# Patient Record
Sex: Female | Born: 1940 | Race: White | Hispanic: No | Marital: Married | Smoking: Never smoker
Health system: Southern US, Community
[De-identification: ages and names within clinical notes are randomized; demographics above are authoritative.]

## PROBLEM LIST (undated history)

## (undated) DIAGNOSIS — M5416 Radiculopathy, lumbar region: Secondary | ICD-10-CM

## (undated) DIAGNOSIS — J189 Pneumonia, unspecified organism: Secondary | ICD-10-CM

## (undated) DIAGNOSIS — I214 Non-ST elevation (NSTEMI) myocardial infarction: Secondary | ICD-10-CM

## (undated) DIAGNOSIS — T4145XA Adverse effect of unspecified anesthetic, initial encounter: Secondary | ICD-10-CM

## (undated) DIAGNOSIS — I10 Essential (primary) hypertension: Secondary | ICD-10-CM

## (undated) DIAGNOSIS — R112 Nausea with vomiting, unspecified: Secondary | ICD-10-CM

## (undated) DIAGNOSIS — S322XXA Fracture of coccyx, initial encounter for closed fracture: Secondary | ICD-10-CM

## (undated) DIAGNOSIS — D649 Anemia, unspecified: Secondary | ICD-10-CM

## (undated) DIAGNOSIS — F419 Anxiety disorder, unspecified: Secondary | ICD-10-CM

## (undated) DIAGNOSIS — Z9889 Other specified postprocedural states: Secondary | ICD-10-CM

## (undated) DIAGNOSIS — T8859XA Other complications of anesthesia, initial encounter: Secondary | ICD-10-CM

## (undated) DIAGNOSIS — S3210XA Unspecified fracture of sacrum, initial encounter for closed fracture: Secondary | ICD-10-CM

## (undated) DIAGNOSIS — L209 Atopic dermatitis, unspecified: Secondary | ICD-10-CM

## (undated) DIAGNOSIS — F32A Depression, unspecified: Secondary | ICD-10-CM

## (undated) DIAGNOSIS — R0600 Dyspnea, unspecified: Secondary | ICD-10-CM

## (undated) DIAGNOSIS — F329 Major depressive disorder, single episode, unspecified: Secondary | ICD-10-CM

## (undated) DIAGNOSIS — E785 Hyperlipidemia, unspecified: Secondary | ICD-10-CM

## (undated) DIAGNOSIS — E039 Hypothyroidism, unspecified: Secondary | ICD-10-CM

## (undated) DIAGNOSIS — I219 Acute myocardial infarction, unspecified: Secondary | ICD-10-CM

## (undated) DIAGNOSIS — I2510 Atherosclerotic heart disease of native coronary artery without angina pectoris: Secondary | ICD-10-CM

## (undated) HISTORY — DX: Non-ST elevation (NSTEMI) myocardial infarction: I21.4

## (undated) HISTORY — DX: Atherosclerotic heart disease of native coronary artery without angina pectoris: I25.10

## (undated) HISTORY — DX: Atopic dermatitis, unspecified: L20.9

## (undated) HISTORY — PX: LAPAROSCOPIC HYSTERECTOMY: SHX1926

## (undated) HISTORY — DX: Acute myocardial infarction, unspecified: I21.9

## (undated) HISTORY — DX: Radiculopathy, lumbar region: M54.16

## (undated) HISTORY — DX: Hypothyroidism, unspecified: E03.9

## (undated) HISTORY — PX: APPENDECTOMY: SHX54

## (undated) HISTORY — DX: Unspecified fracture of sacrum, initial encounter for closed fracture: S32.10XA

## (undated) HISTORY — PX: HIP SURGERY: SHX245

## (undated) HISTORY — PX: OTHER SURGICAL HISTORY: SHX169

## (undated) HISTORY — PX: TOOTH EXTRACTION: SUR596

## (undated) HISTORY — PX: TONSILLECTOMY: SUR1361

## (undated) HISTORY — DX: Essential (primary) hypertension: I10

## (undated) HISTORY — DX: Hyperlipidemia, unspecified: E78.5

## (undated) HISTORY — DX: Fracture of coccyx, initial encounter for closed fracture: S32.2XXA

## (undated) HISTORY — PX: CARPAL TUNNEL RELEASE: SHX101

---

## 2001-03-09 ENCOUNTER — Encounter: Admission: RE | Admit: 2001-03-09 | Discharge: 2001-03-09 | Payer: Self-pay | Admitting: Internal Medicine

## 2001-03-09 ENCOUNTER — Encounter: Payer: Self-pay | Admitting: Internal Medicine

## 2001-04-13 ENCOUNTER — Other Ambulatory Visit: Admission: RE | Admit: 2001-04-13 | Discharge: 2001-04-13 | Payer: Self-pay | Admitting: Obstetrics and Gynecology

## 2001-07-19 ENCOUNTER — Ambulatory Visit (HOSPITAL_COMMUNITY): Admission: RE | Admit: 2001-07-19 | Discharge: 2001-07-19 | Payer: Self-pay | Admitting: Gastroenterology

## 2002-05-28 ENCOUNTER — Other Ambulatory Visit: Admission: RE | Admit: 2002-05-28 | Discharge: 2002-05-28 | Payer: Self-pay | Admitting: Obstetrics and Gynecology

## 2002-11-22 ENCOUNTER — Encounter: Admission: RE | Admit: 2002-11-22 | Discharge: 2002-11-22 | Payer: Self-pay | Admitting: Internal Medicine

## 2002-11-22 ENCOUNTER — Encounter: Payer: Self-pay | Admitting: Internal Medicine

## 2002-12-13 ENCOUNTER — Encounter: Payer: Self-pay | Admitting: Internal Medicine

## 2002-12-13 ENCOUNTER — Encounter: Admission: RE | Admit: 2002-12-13 | Discharge: 2002-12-13 | Payer: Self-pay | Admitting: Internal Medicine

## 2003-02-14 ENCOUNTER — Encounter: Admission: RE | Admit: 2003-02-14 | Discharge: 2003-02-14 | Payer: Self-pay | Admitting: Family Medicine

## 2003-02-14 ENCOUNTER — Encounter: Payer: Self-pay | Admitting: Family Medicine

## 2003-07-04 ENCOUNTER — Encounter: Payer: Self-pay | Admitting: General Surgery

## 2003-07-04 ENCOUNTER — Encounter: Admission: RE | Admit: 2003-07-04 | Discharge: 2003-07-04 | Payer: Self-pay | Admitting: General Surgery

## 2004-01-22 ENCOUNTER — Ambulatory Visit (HOSPITAL_BASED_OUTPATIENT_CLINIC_OR_DEPARTMENT_OTHER): Admission: RE | Admit: 2004-01-22 | Discharge: 2004-01-22 | Payer: Self-pay | Admitting: Orthopedic Surgery

## 2004-01-22 ENCOUNTER — Ambulatory Visit (HOSPITAL_COMMUNITY): Admission: RE | Admit: 2004-01-22 | Discharge: 2004-01-22 | Payer: Self-pay | Admitting: Orthopedic Surgery

## 2004-02-12 ENCOUNTER — Ambulatory Visit (HOSPITAL_BASED_OUTPATIENT_CLINIC_OR_DEPARTMENT_OTHER): Admission: RE | Admit: 2004-02-12 | Discharge: 2004-02-12 | Payer: Self-pay | Admitting: Orthopedic Surgery

## 2005-01-31 ENCOUNTER — Encounter: Admission: RE | Admit: 2005-01-31 | Discharge: 2005-01-31 | Payer: Self-pay | Admitting: Orthopedic Surgery

## 2005-02-03 ENCOUNTER — Ambulatory Visit (HOSPITAL_COMMUNITY): Admission: RE | Admit: 2005-02-03 | Discharge: 2005-02-03 | Payer: Self-pay | Admitting: Orthopedic Surgery

## 2005-02-03 ENCOUNTER — Ambulatory Visit (HOSPITAL_BASED_OUTPATIENT_CLINIC_OR_DEPARTMENT_OTHER): Admission: RE | Admit: 2005-02-03 | Discharge: 2005-02-03 | Payer: Self-pay | Admitting: Orthopedic Surgery

## 2005-05-25 ENCOUNTER — Encounter: Admission: RE | Admit: 2005-05-25 | Discharge: 2005-05-25 | Payer: Self-pay | Admitting: Family Medicine

## 2005-06-02 ENCOUNTER — Emergency Department (HOSPITAL_COMMUNITY): Admission: EM | Admit: 2005-06-02 | Discharge: 2005-06-03 | Payer: Self-pay | Admitting: Emergency Medicine

## 2005-07-11 ENCOUNTER — Encounter: Admission: RE | Admit: 2005-07-11 | Discharge: 2005-07-11 | Payer: Self-pay | Admitting: Family Medicine

## 2005-10-03 LAB — HM COLONOSCOPY

## 2006-02-09 LAB — CONVERTED CEMR LAB: Pap Smear: NORMAL

## 2006-06-08 ENCOUNTER — Emergency Department (HOSPITAL_COMMUNITY): Admission: EM | Admit: 2006-06-08 | Discharge: 2006-06-08 | Payer: Self-pay | Admitting: Emergency Medicine

## 2006-07-17 ENCOUNTER — Ambulatory Visit (HOSPITAL_COMMUNITY): Admission: RE | Admit: 2006-07-17 | Discharge: 2006-07-17 | Payer: Self-pay | Admitting: Family Medicine

## 2006-09-13 ENCOUNTER — Other Ambulatory Visit: Admission: RE | Admit: 2006-09-13 | Discharge: 2006-09-13 | Payer: Self-pay | Admitting: Family Medicine

## 2007-10-04 DIAGNOSIS — I214 Non-ST elevation (NSTEMI) myocardial infarction: Secondary | ICD-10-CM

## 2007-10-04 DIAGNOSIS — I219 Acute myocardial infarction, unspecified: Secondary | ICD-10-CM

## 2007-10-04 HISTORY — PX: CARDIAC CATHETERIZATION: SHX172

## 2007-10-04 HISTORY — DX: Acute myocardial infarction, unspecified: I21.9

## 2007-10-04 HISTORY — DX: Non-ST elevation (NSTEMI) myocardial infarction: I21.4

## 2007-11-11 ENCOUNTER — Ambulatory Visit: Payer: Self-pay | Admitting: Cardiology

## 2007-11-11 ENCOUNTER — Inpatient Hospital Stay (HOSPITAL_COMMUNITY): Admission: EM | Admit: 2007-11-11 | Discharge: 2007-11-13 | Payer: Self-pay | Admitting: Emergency Medicine

## 2007-11-29 ENCOUNTER — Ambulatory Visit: Payer: Self-pay | Admitting: Cardiovascular Disease

## 2008-01-04 ENCOUNTER — Ambulatory Visit: Payer: Self-pay | Admitting: Cardiovascular Disease

## 2008-01-04 LAB — CONVERTED CEMR LAB
Bilirubin, Direct: 0.1 mg/dL (ref 0.0–0.3)
Cholesterol: 153 mg/dL (ref 0–200)
HDL: 42.6 mg/dL (ref >39.0)
LDL Cholesterol: 93 mg/dL (ref 0–99)
Total Bilirubin: 0.8 mg/dL (ref 0.3–1.2)
VLDL: 18 mg/dL (ref 0–40)

## 2008-01-07 ENCOUNTER — Ambulatory Visit: Payer: Self-pay | Admitting: Cardiovascular Disease

## 2008-04-07 ENCOUNTER — Ambulatory Visit: Payer: Self-pay | Admitting: Cardiovascular Disease

## 2008-04-07 LAB — CONVERTED CEMR LAB
Albumin: 3.9 g/dL (ref 3.5–5.2)
Total Bilirubin: 0.9 mg/dL (ref 0.3–1.2)
Total Protein: 7.4 g/dL (ref 6.0–8.3)

## 2008-07-29 ENCOUNTER — Ambulatory Visit: Payer: Self-pay | Admitting: Cardiovascular Disease

## 2008-08-01 ENCOUNTER — Ambulatory Visit: Payer: Self-pay

## 2008-12-04 ENCOUNTER — Ambulatory Visit: Payer: Self-pay | Admitting: Internal Medicine

## 2008-12-04 DIAGNOSIS — E039 Hypothyroidism, unspecified: Secondary | ICD-10-CM | POA: Insufficient documentation

## 2008-12-04 DIAGNOSIS — M818 Other osteoporosis without current pathological fracture: Secondary | ICD-10-CM | POA: Insufficient documentation

## 2008-12-04 DIAGNOSIS — I252 Old myocardial infarction: Secondary | ICD-10-CM

## 2008-12-04 DIAGNOSIS — E785 Hyperlipidemia, unspecified: Secondary | ICD-10-CM

## 2008-12-04 DIAGNOSIS — M810 Age-related osteoporosis without current pathological fracture: Secondary | ICD-10-CM

## 2008-12-04 DIAGNOSIS — L2089 Other atopic dermatitis: Secondary | ICD-10-CM

## 2009-01-19 DIAGNOSIS — I2510 Atherosclerotic heart disease of native coronary artery without angina pectoris: Secondary | ICD-10-CM

## 2009-01-20 ENCOUNTER — Encounter: Payer: Self-pay | Admitting: Cardiovascular Disease

## 2009-01-20 ENCOUNTER — Ambulatory Visit: Payer: Self-pay | Admitting: Cardiovascular Disease

## 2009-01-23 ENCOUNTER — Ambulatory Visit: Payer: Self-pay | Admitting: Cardiovascular Disease

## 2009-01-26 LAB — CONVERTED CEMR LAB
ALT: 21 units/L (ref 0–35)
AST: 19 units/L (ref 0–37)
Albumin: 3.9 g/dL (ref 3.5–5.2)
Alkaline Phosphatase: 55 units/L (ref 39–117)
BUN: 14 mg/dL (ref 6–23)
Basophils Absolute: 0.2 10*3/uL — ABNORMAL HIGH (ref 0.0–0.1)
Basophils Relative: 3.8 % — ABNORMAL HIGH (ref 0.0–3.0)
Bilirubin, Direct: 0.1 mg/dL (ref 0.0–0.3)
CO2: 27 meq/L (ref 19–32)
Calcium: 9.5 mg/dL (ref 8.4–10.5)
Chloride: 109 meq/L (ref 96–112)
Cholesterol: 139 mg/dL (ref 0–200)
Creatinine, Ser: 0.7 mg/dL (ref 0.4–1.2)
Eosinophils Absolute: 0.5 10*3/uL (ref 0.0–0.7)
Eosinophils Relative: 9.8 % — ABNORMAL HIGH (ref 0.0–5.0)
GFR calc non Af Amer: 88.5 mL/min (ref >60)
Glucose, Bld: 89 mg/dL (ref 70–99)
HCT: 39.5 % (ref 36.0–46.0)
HDL: 44.7 mg/dL (ref >39.00)
Hemoglobin: 13.6 g/dL (ref 12.0–15.0)
LDL Cholesterol: 84 mg/dL (ref 0–99)
Lymphocytes Relative: 43.6 % (ref 12.0–46.0)
Lymphs Abs: 2.3 10*3/uL (ref 0.7–4.0)
MCHC: 34.5 g/dL (ref 30.0–36.0)
MCV: 94.8 fL (ref 78.0–100.0)
Monocytes Absolute: 0.2 10*3/uL (ref 0.1–1.0)
Monocytes Relative: 4.6 % (ref 3.0–12.0)
Neutro Abs: 2 10*3/uL (ref 1.4–7.7)
Neutrophils Relative %: 38.2 % — ABNORMAL LOW (ref 43.0–77.0)
Platelets: 206 10*3/uL (ref 150.0–400.0)
Potassium: 4.1 meq/L (ref 3.5–5.1)
RBC: 4.17 M/uL (ref 3.87–5.11)
RDW: 11.8 % (ref 11.5–14.6)
Sodium: 142 meq/L (ref 135–145)
TSH: 2.13 microintl units/mL (ref 0.35–5.50)
Total Bilirubin: 1 mg/dL (ref 0.3–1.2)
Total CHOL/HDL Ratio: 3
Total Protein: 7.2 g/dL (ref 6.0–8.3)
Triglycerides: 53 mg/dL (ref 0.0–149.0)
VLDL: 10.6 mg/dL (ref 0.0–40.0)
WBC: 5.2 10*3/uL (ref 4.5–10.5)

## 2009-04-07 ENCOUNTER — Ambulatory Visit: Payer: Self-pay | Admitting: Internal Medicine

## 2009-04-07 LAB — CONVERTED CEMR LAB
ALT: 21 units/L (ref 0–35)
AST: 20 units/L (ref 0–37)
Albumin ELP: 57.5 % (ref 55.8–66.1)
Alpha-1-Globulin: 4.3 % (ref 2.9–4.9)
Alpha-2-Globulin: 9.3 % (ref 7.1–11.8)
Basophils Absolute: 0 10*3/uL (ref 0.0–0.1)
Basophils Relative: 0.6 % (ref 0.0–3.0)
Beta Globulin: 5.3 % (ref 4.7–7.2)
Bilirubin, Direct: 0.1 mg/dL (ref 0.0–0.3)
CO2: 30 meq/L (ref 19–32)
Calcium: 9.6 mg/dL (ref 8.4–10.5)
Cholesterol, target level: 200 mg/dL
Eosinophils Relative: 6.3 % — ABNORMAL HIGH (ref 0.0–5.0)
Gamma Globulin: 18.6 % (ref 11.1–18.8)
HCT: 40.1 % (ref 36.0–46.0)
HDL goal, serum: 40 mg/dL
LDL Goal: 100 mg/dL
Monocytes Relative: 6.6 % (ref 3.0–12.0)
Neutrophils Relative %: 49.7 % (ref 43.0–77.0)
PTH: 62.4 pg/mL (ref 14.0–72.0)
Platelets: 218 10*3/uL (ref 150.0–400.0)
RBC: 4.21 M/uL (ref 3.87–5.11)
RDW: 12.2 % (ref 11.5–14.6)
TSH: 0.68 microintl units/mL (ref 0.35–5.50)
Total Protein, Urine: NEGATIVE mg/dL
Urine Glucose: NEGATIVE mg/dL
Urobilinogen, UA: 0.2 (ref 0.0–1.0)
Vit D, 25-Hydroxy: 32 ng/mL (ref 30–89)
WBC: 4.9 10*3/uL (ref 4.5–10.5)
pH: 7.5 (ref 5.0–8.0)

## 2009-04-08 ENCOUNTER — Encounter: Payer: Self-pay | Admitting: Internal Medicine

## 2009-04-08 ENCOUNTER — Ambulatory Visit: Payer: Self-pay | Admitting: Family Medicine

## 2009-04-13 ENCOUNTER — Telehealth: Payer: Self-pay | Admitting: Internal Medicine

## 2009-04-14 ENCOUNTER — Telehealth: Payer: Self-pay | Admitting: Internal Medicine

## 2009-04-17 ENCOUNTER — Encounter: Payer: Self-pay | Admitting: Internal Medicine

## 2009-05-01 ENCOUNTER — Encounter: Payer: Self-pay | Admitting: Internal Medicine

## 2009-05-01 ENCOUNTER — Telehealth: Payer: Self-pay | Admitting: Internal Medicine

## 2009-08-04 ENCOUNTER — Ambulatory Visit: Payer: Self-pay | Admitting: Cardiovascular Disease

## 2009-10-08 ENCOUNTER — Encounter: Payer: Self-pay | Admitting: Cardiovascular Disease

## 2009-10-27 ENCOUNTER — Ambulatory Visit: Payer: Self-pay | Admitting: Cardiovascular Disease

## 2009-11-02 LAB — CONVERTED CEMR LAB
Albumin: 4.1 g/dL (ref 3.5–5.2)
HDL: 51.1 mg/dL (ref >39.00)
TSH: 1.18 microintl units/mL (ref 0.35–5.50)
Total Bilirubin: 0.7 mg/dL (ref 0.3–1.2)
Total Protein: 7.4 g/dL (ref 6.0–8.3)
Triglycerides: 97 mg/dL (ref 0.0–149.0)

## 2010-05-26 ENCOUNTER — Emergency Department (HOSPITAL_COMMUNITY): Admission: EM | Admit: 2010-05-26 | Discharge: 2010-05-26 | Payer: Self-pay | Admitting: Emergency Medicine

## 2010-05-28 ENCOUNTER — Telehealth: Payer: Self-pay | Admitting: Cardiovascular Disease

## 2010-06-01 ENCOUNTER — Ambulatory Visit: Payer: Self-pay | Admitting: Cardiovascular Disease

## 2010-06-01 ENCOUNTER — Telehealth: Payer: Self-pay | Admitting: Cardiovascular Disease

## 2010-06-17 ENCOUNTER — Telehealth: Payer: Self-pay | Admitting: Cardiovascular Disease

## 2010-06-17 DIAGNOSIS — H5310 Unspecified subjective visual disturbances: Secondary | ICD-10-CM | POA: Insufficient documentation

## 2010-06-29 ENCOUNTER — Ambulatory Visit: Payer: Self-pay

## 2010-06-29 ENCOUNTER — Encounter: Payer: Self-pay | Admitting: Cardiovascular Disease

## 2010-07-30 ENCOUNTER — Encounter: Payer: Self-pay | Admitting: Cardiovascular Disease

## 2010-07-30 DIAGNOSIS — J309 Allergic rhinitis, unspecified: Secondary | ICD-10-CM | POA: Insufficient documentation

## 2010-07-30 DIAGNOSIS — M810 Age-related osteoporosis without current pathological fracture: Secondary | ICD-10-CM | POA: Insufficient documentation

## 2010-08-02 DIAGNOSIS — E559 Vitamin D deficiency, unspecified: Secondary | ICD-10-CM | POA: Insufficient documentation

## 2010-11-04 NOTE — Progress Notes (Signed)
Summary: Visual Change  Phone Note Call from Patient   Caller: Patient [phone] Reason for Call: Talk to Nurse Summary of Call: pt started carda tx and not feeling any better-is there any non rx meds she can take for bp? -pls call until 3p if not can be reached on monday Initial call taken by: Glynda Jaeger,  June 17, 2010 10:53 AM  Follow-up for Phone Call        I spoke with the pt and she only took [place] XT for 4 days.  The pt stopped due to swelling of her face and eyes.  The pt's BP yesterday was 132/72.  The pt wanted to know what she could do to lower her BP naturally.  I told the pt the main thing is to manage stress and anxiety.  The pt plans on restarting yoga and meditation.  The pt also wanted to make Dr Excell Seltzer aware of an episode she had a few days ago.  The pt was playing bridge on the computer and she could only see 10 or the 13 cards.  This visual change lasted a few seconds.  The pt decided to take a NTG and lay down for 15 minutes.  When the pt got up she was "good to go".  The pt did have a similar episode about 4 years ago.  With that episode she was reading the paper and could not see some of the words.  The pt did have a CT at that time and she received conflicting results about this test.  The pt did see Dr Elmer Picker (opthamologist) this morning for routine eye exam and everything was okay.  The pt has not had a carotid in the past.  I will make Dr Excell Seltzer aware of this episode to see if he would like to do further testing.     Follow-up by: Julieta Gutting, RN, BSN,  June 17, 2010 11:11 AM  Additional Follow-up for Phone Call Additional follow up Details #1::        Agree would check carotid duplex Additional Follow-up by: Norva Karvonen, MD,  June 20, 2010 9:10 PM  New Problems: VISUAL CHANGES (ICD-368.10)   Additional Follow-up for Phone Call Additional follow up Details #2::    Pt aware and carotid scheduled on 06/29/10 at 3:00. Follow-up by: Julieta Gutting, RN, BSN,  June 21, 2010 10:56 AM  New Problems: VISUAL CHANGES (ICD-368.10)

## 2010-11-04 NOTE — Assessment & Plan Note (Signed)
Summary: pt seen in ER for CP   Visit Type:  Follow-up Primary Provider:  Dr Duanne Guess  CC:  Chest pains. Pt. seen in ER.  History of Present Illness: This is a 70 year old one with coronary artery disease who initially presented in 2009 with an acute coronary syndrome. She was found to have an occluded small obtuse marginal branch that was treated with balloon angioplasty because of its small size. She has had no recurrent events since her initial presentation. Her LV ejection fraction is preserved at 55-60%. She underwent a stress nuclear study in October 2009 because of recurrent chest pain and this demonstrated normal perfusion throughout.  She is walking 30-40 minutes for exercise several days per week and denies exertional symptoms. However, she continues to have episodic resting chest tightness. She had a prolonged episode last week and went to the ER for evaluation. She ruled out for an MI and was discharged home after her ER evaluation. She is unable to take sublingual NTG because of severe headache. She has undergone stress testing for similar episodes of chest pain but these studies have not shown ischemia.  Has a fasting bloodwork visit with Dr Duanne Guess in October.  Current Medications (verified): 1)  Crestor 10 Mg Tabs (Rosuvastatin Calcium) .... Take 1 Tab By Mouth At Bedtime 2)  Levothyroxine Sodium 125 Mcg Tabs (Levothyroxine Sodium) .... Take 1 Tablet By Mouth Once A Day 3)  Coq10 400 Mg Caps (Coenzyme Q10) .... Take 1 Capsule By Mouth Once A Day 4)  Cormax 0.05 % Soln (Clobetasol Propionate) .... Apply To Scalp Two Times A Day As Needed 5)  Desoximetasone 0.25 % Oint (Desoximetasone) .... Apply To Aa Two Times A Day As Needed 6)  Benadryl 25 Mg Caps (Diphenhydramine Hcl) .... Take 1 By Mouth At Bedtime 7)  Calcium Carbonate-Vitamin D 600-400 Mg-Unit  Tabs (Calcium Carbonate-Vitamin D) .... Take 1 Tablet By Mouth Two Times A Day 8)  Aspirin 81 Mg Tbec (Aspirin) .... Take One Tablet  By Mouth Daily 9)  Icaps Lutein-Zeaxanthin  Cr-Tabs (Specialty Vitamins Products) .... Take 1 Capsule By Mouth Once A Day 10)  Nitrostat 0.4 Mg Subl (Nitroglycerin) .Marland Kitchen.. 1 Tablet Under Tongue At Onset of Chest Pain; You May Repeat Every 5 Minutes For Up To 3 Doses.  Allergies: 1)  ! Penicillin 2)  ! Erythromycin 3)  ! Tetracycline 4)  ! Cipro 5)  ! * Shell Fish 6)  ! * Latex 7)  ! Keflex 8)  ! * Ppg 9)  ! * Proplyene Glycol  Past History:  Past medical history reviewed for relevance to current acute and chronic problems.  Past Medical History: Reviewed history from 01/19/2009 and no changes required. CAD, UNSPECIFIED SITE (ICD-414.00) with NSTEMI 2009. Small OM treated with PTCA ECZEMA, ATOPIC (ICD-691.8) FAMILY HISTORY OF COLON CA 1ST DEGREE RELATIVE <60 (ICD-V16.0) OSTEOPOROSIS (ICD-733.00) MYOCARDIAL INFARCTION, HX OF (ICD-412) HYPOTHYROIDISM (ICD-244.9) HYPERLIPIDEMIA (ICD-272.4)    Review of Systems       Negative except as per HPI   Vital Signs:  Patient profile:   70 year old female Height:      65 inches Weight:      147 pounds BMI:     24.55 Pulse rate:   68 / minute Pulse rhythm:   regular Resp:     18 per minute BP sitting:   141 / 86  (left arm) Cuff size:   large  Vitals Entered By: Vikki Ports (June 01, 2010 3:00 PM)  Physical  Exam  General:  Pt is alert and oriented, in no acute distress. HEENT: normal Neck: normal carotid upstrokes without bruits, JVP normal Lungs: CTA CV: RRR without murmur or gallop Abd: soft, NT, positive BS, no bruit, no organomegaly Ext: no clubbing, cyanosis, or edema. peripheral pulses 2+ and equal Skin: warm and dry without rash    EKG  Procedure date:  06/01/2010  Findings:      NSR with age-inderminate inferior infarct, unchanged from previous tracing, HR 69 bpm.  Impression & Recommendations:  Problem # 1:  CAD, UNSPECIFIED SITE (ICD-414.00) Pt with recurrent chest pain without objective evidence  of ischemia. However, her symptoms are fairly typical of angina. I suspect she is either having vasospasm or microvascular ischemia. Recommend trial of long-acting diltiazem. Will otherwise continue her current medical program as outlined below.  Her updated medication list for this problem includes:    Aspirin 81 Mg Tbec (Aspirin) .Marland Kitchen... Take one tablet by mouth daily    Nitrostat 0.4 Mg Subl (Nitroglycerin) .Marland Kitchen... 1 tablet under tongue at onset of chest pain; you may repeat every 5 minutes for up to 3 doses.    Cartia Xt 120 Mg Xr24h-cap (Diltiazem hcl coated beads) .Marland Kitchen... Take one capsule by mouth once daily  Orders: EKG w/ Interpretation (93000)  Problem # 2:  HYPERLIPIDEMIA (ICD-272.4) Fasting bloodwork with Dr Duanne Guess scheduled in October. Lipids have been at goal as below.  Her updated medication list for this problem includes:    Crestor 10 Mg Tabs (Rosuvastatin calcium) .Marland Kitchen... Take 1 tab by mouth at bedtime  CHOL: 148 (10/27/2009)   LDL: 78 (10/27/2009)   HDL: 51.10 (10/27/2009)   TG: 97.0 (10/27/2009) CHOL (goal): 200 (04/07/2009)   LDL (goal): 100 (04/07/2009)   HDL (goal): 40 (04/07/2009)   TG (goal): 150 (04/07/2009)  Patient Instructions: 1)  Your physician has recommended you make the following change in your medication: START Diltiazem CD 120mg  once a day 2)  Your physician wants you to follow-up in: 6 MONTHS.   You will receive a reminder letter in the mail two months in advance. If you don't receive a letter, please call our office to schedule the follow-up appointment. Prescriptions: DILT-CD 120 MG XR24H-CAP (DILTIAZEM HCL COATED BEADS) take one capsule by mouth once daily  #90 x 3   Entered by:   Julieta Gutting, RN, BSN   Authorized by:   Norva Karvonen, MD   Signed by:   Julieta Gutting, RN, BSN on 06/01/2010   Method used:   Electronically to        [facility] [phone]* (retail)       [address] [place], [place]  [address]        Ph: [phone]       Fax: [phone]   RxID:   [id number] NITROSTAT 0.4 MG SUBL (NITROGLYCERIN) 1 tablet under tongue at onset of chest pain; you may repeat every 5 minutes for up to 3 doses.  #25 x 2   Entered by:   Julieta Gutting, RN, BSN   Authorized by:   Norva Karvonen, MD   Signed by:   Julieta Gutting, RN, BSN on 06/01/2010   Method used:   Electronically to        [facility] [phone]* (retail)       [address]  [place], [place]  [address]       Ph: [phone]       Fax: [phone]   RxID:   [id number]

## 2010-11-04 NOTE — Progress Notes (Signed)
Summary: ER Visit  Phone Note Outgoing Call   Call placed by: Julieta Gutting, RN, BSN,  May 28, 2010 12:07 PM Call placed to: Patient Summary of Call: I received a notification from the [facility] ER that this pt was seen in the ER on 05/26/10 for CP.   I contacted the pt and she said she has been okay since leaving the hospital.  The pt said she was under the impression that our office was going to call her on 05/26/10 for an appt.  I asked her why she did not contact our office after not receiving a call and she said she was tired. Per ER instructions the pt was to call our office if she had not received a call by 2:00 on 05/26/10.   I arranged an appt on 06/01/10 for the pt to see Dr Excell Seltzer. I instructed the pt to call our office if she had any other questions or concerns.  Initial call taken by: Julieta Gutting, RN, BSN,  May 28, 2010 12:03 PM

## 2010-11-04 NOTE — Progress Notes (Signed)
Summary: [facility] question re med  Phone Note From Pharmacy   Reason for Call: Talk to Nurse Caller: [facility] [phone]* Summary of Call: [facility] has questions re rx we sent in today-pls call [phone] Initial call taken by: Glynda Jaeger,  June 01, 2010 4:16 PM  Follow-up for Phone Call        I spoke with the pt's pharmacist at [facility] and they do not carry Diltiazem CD.  The pharmacist has Cartia XT 120mg  in stock and this will be cheaper for the pt.  The pharmacist will change Rx over to [facility] XT 120mg  once daily.  Dr Excell Seltzer and pt aware. Follow-up by: Julieta Gutting, RN, BSN,  June 01, 2010 5:03 PM    New/Updated Medications: CARTIA XT 120 MG XR24H-CAP (DILTIAZEM HCL COATED BEADS) take one capsule by mouth once daily

## 2010-11-04 NOTE — Letter (Signed)
Summary: [name]   [facility]   Imported By: Roderic Ovens 11/20/2009 11:30:58  _____________________________________________________________________  External Attachment:    Type:   Image     Comment:   External Document

## 2010-12-03 ENCOUNTER — Encounter (INDEPENDENT_AMBULATORY_CARE_PROVIDER_SITE_OTHER): Payer: Medicare Other | Admitting: Cardiovascular Disease

## 2010-12-03 ENCOUNTER — Encounter: Payer: Self-pay | Admitting: Cardiovascular Disease

## 2010-12-03 ENCOUNTER — Telehealth: Payer: Self-pay | Admitting: Cardiovascular Disease

## 2010-12-03 DIAGNOSIS — E7800 Pure hypercholesterolemia, unspecified: Secondary | ICD-10-CM

## 2010-12-03 DIAGNOSIS — I2510 Atherosclerotic heart disease of native coronary artery without angina pectoris: Secondary | ICD-10-CM

## 2010-12-03 NOTE — Progress Notes (Unsigned)
This encounter was created in error - please disregard.

## 2010-12-03 NOTE — Progress Notes (Signed)
This encounter was created in error - please disregard.

## 2010-12-09 NOTE — Assessment & Plan Note (Signed)
Summary: Victoria [name]   Visit Type:  Follow-up Primary Provider:  Dr Duanne Guess  CC:  Stress.  History of Present Illness: This is a 70 year old one with coronary artery disease who initially presented in 2009 with an acute coronary syndrome. She was found to have an occluded small obtuse marginal branch that was treated with balloon angioplasty because of its small size. She has had no recurrent events since her initial presentation. Her LV ejection fraction is preserved at 55-60%. She underwent a stress nuclear study in October 2009 because of recurrent chest pain and this demonstrated normal perfusion throughout.  She complains of a 'mental fog.' She says 'I feel drugged' and thinks she doesn't feel like she's thinking clearly. She really feels debilitated from this - she hasn't been driving as much and is less engaged with her friends.  She has had URI symptoms over much of the Winter months. She denies chest pain or dyspnea. No edema. No other complaints.   Current Medications (verified): 1)  Crestor 10 Mg Tabs (Rosuvastatin Calcium) .... Take 1 Tab By Mouth At Bedtime 2)  Levothyroxine Sodium 125 Mcg Tabs (Levothyroxine Sodium) .... Take 1 Tablet By Mouth Once A Day 3)  Coq10 400 Mg Caps (Coenzyme Q10) .... Take 1 Capsule By Mouth Once A Day 4)  Cormax 0.05 % Soln (Clobetasol Propionate) .... Apply To Scalp Two Times A Day As Needed 5)  Desoximetasone 0.25 % Oint (Desoximetasone) .... Apply To Aa Two Times A Day As Needed 6)  Benadryl 25 Mg Caps (Diphenhydramine Hcl) .... Take 1 By Mouth At Bedtime 7)  Calcium Carbonate-Vitamin D 600-400 Mg-Unit  Tabs (Calcium Carbonate-Vitamin D) .... Take 1 Tablet By Mouth Two Times A Day 8)  Aspirin 81 Mg Tbec (Aspirin) .... Take One Tablet By Mouth Daily 9)  Icaps Lutein-Zeaxanthin  Cr-Tabs (Specialty Vitamins Products) .... Take 1 Capsule By Mouth Once A Day 10)  Nitrostat 0.4 Mg Subl (Nitroglycerin) .Marland Kitchen.. 1 Tablet Under Tongue At Onset of Chest Pain; You May  Repeat Every 5 Minutes For Up To 3 Doses. 11)  Vitamin D 1000 Unit Tabs (Cholecalciferol) .... Take 1 Tablet By Mouth Once A Day  Allergies: 1)  ! Penicillin 2)  ! Erythromycin 3)  ! Tetracycline 4)  ! Cipro 5)  ! * Shell Fish 6)  ! * Latex 7)  ! Keflex 8)  ! * Ppg 9)  ! * Proplyene Glycol  Past History:  Past medical history reviewed for relevance to current acute and chronic problems.  Past Medical History: Reviewed history from 01/19/2009 and no changes required. CAD, UNSPECIFIED SITE (ICD-414.00) with NSTEMI 2009. Small OM treated with PTCA ECZEMA, ATOPIC (ICD-691.8) FAMILY HISTORY OF COLON CA 1ST DEGREE RELATIVE <60 (ICD-V16.0) OSTEOPOROSIS (ICD-733.00) MYOCARDIAL INFARCTION, HX OF (ICD-412) HYPOTHYROIDISM (ICD-244.9) HYPERLIPIDEMIA (ICD-272.4)    Review of Systems       Positive for lots of stress (ill family members), otherwise negative except as per HPI   Vital Signs:  Patient profile:   70 year old female Height:      65 inches Weight:      146.50 pounds BMI:     24.47 Pulse rate:   74 / minute Pulse rhythm:   regular Resp:     18 per minute BP sitting:   140 / 80  (left arm) Cuff size:   large  Vitals Entered By: Julieta Gutting, RN, BSN (December 03, 2010 12:15 PM)  Physical Exam  General:  Pt is alert and oriented,  in no acute distress. HEENT: normal Neck: normal carotid upstrokes without bruits, JVP normal Lungs: CTA CV: RRR without murmur or gallop Abd: soft, NT, positive BS, no bruit, no organomegaly Ext: no clubbing, cyanosis, or edema. peripheral pulses 2+ and equal Skin: warm and dry without rash    EKG  Procedure date:  12/03/2010  Findings:      NSR, RSR' in V1, otherwise WNL  Impression & Recommendations:  Problem # 1:  CAD, UNSPECIFIED SITE (ICD-414.00) Pt stable without recurrent angina. Continue antiplatelet Rx with ASA. We had a long discussion about the benefit and side effects of her statin drug. It's clear that her memory  and cognitive problems are making a big impact on her life. I've recommended stopping rosuvastatin for at least a few months to see if these symptoms improve. She will call in and let us know.  Her updated medication list for this problem includes:    Aspirin 81 Mg Tbec (Aspirin) .Marland Kitchen... Take one tablet by mouth daily    Nitrostat 0.4 Mg Subl (Nitroglycerin) .Marland Kitchen... 1 tablet under tongue at onset of chest pain; you may repeat every 5 minutes for up to 3 doses.  Problem # 2:  HYPERLIPIDEMIA (ICD-272.4) Lipids have been well-controlled on statin therapy. See discussion above. Will bring her back in 3 months for a lipid panel.  The following medications were removed from the medication list:    Crestor 10 Mg Tabs (Rosuvastatin calcium) .Marland Kitchen... Take 1 tab by mouth at bedtime  CHOL: 148 (10/27/2009)   LDL: 78 (10/27/2009)   HDL: 51.10 (10/27/2009)   TG: 97.0 (10/27/2009) CHOL (goal): 200 (04/07/2009)   LDL (goal): 100 (04/07/2009)   HDL (goal): 40 (04/07/2009)   TG (goal): 150 (04/07/2009)  Patient Instructions: 1)  Your physician has recommended you make the following change in your medication: STOP Crestor 2)  Your physician wants you to follow-up in: 1 YEAR.   You will receive a reminder letter in the mail two months in advance. If you don't receive a letter, please call our office to schedule the follow-up appointment. 3)  Your physician recommends that you return for a FASTING LIPID and LIVER in 3 MONTHS--Nothing to eat or drink after midnight, lab opens at 8:30 (414.02, 272.0)--due for labs the week of 03/07/11

## 2010-12-09 NOTE — Progress Notes (Signed)
Summary: Fish Oil  Phone Note Call from Patient Call back at [facility] Phone [phone]   Caller: Patient Summary of Call: Question abot taking Fish Oil Initial call taken by: Judie Grieve,  December 03, 2010 2:46 PM  Follow-up for Phone Call        I spoke with the pt and she said Dr Excell Seltzer had spoken with her about starting Fish Oil.  The pt was wondering what dose of Fish oil she needs to take.  I made her aware this is OTC and she can take Fish Oil 1000mg  two times a day.   Follow-up by: Julieta Gutting, RN, BSN,  December 03, 2010 6:28 PM    New/Updated Medications: FISH OIL 1000 MG CAPS (OMEGA-3 FATTY ACIDS) take one by mouth two times a day

## 2010-12-17 LAB — DIFFERENTIAL
Eosinophils Relative: 7 % — ABNORMAL HIGH (ref 0–5)
Lymphocytes Relative: 52 % — ABNORMAL HIGH (ref 12–46)
Lymphs Abs: 3.7 10*3/uL (ref 0.7–4.0)
Monocytes Absolute: 0.5 10*3/uL (ref 0.1–1.0)
Monocytes Relative: 6 % (ref 3–12)
Neutro Abs: 2.4 10*3/uL (ref 1.7–7.7)
Neutrophils Relative %: 34 % — ABNORMAL LOW (ref 43–77)

## 2010-12-17 LAB — BASIC METABOLIC PANEL
BUN: 15 mg/dL (ref 6–23)
GFR calc Af Amer: >60 mL/min (ref >60)
Potassium: 3.7 mEq/L (ref 3.5–5.1)
Sodium: 137 mEq/L (ref 135–145)

## 2010-12-17 LAB — CBC
Hemoglobin: 12.8 g/dL (ref 12.0–15.0)
MCHC: 34.2 g/dL (ref 30.0–36.0)
Platelets: 215 10*3/uL (ref 150–400)
RDW: 12.9 % (ref 11.5–15.5)
WBC: 7.1 10*3/uL (ref 4.0–10.5)

## 2010-12-17 LAB — POCT CARDIAC MARKERS: Troponin i, poc: <0.05 ng/mL (ref 0.00–0.09)

## 2010-12-21 NOTE — Progress Notes (Signed)
This encounter was created in error - please disregard.

## 2011-01-21 ENCOUNTER — Telehealth: Payer: Self-pay | Admitting: Cardiovascular Disease

## 2011-01-21 NOTE — Telephone Encounter (Signed)
The pt is having issues with congestion due to allergies and cold symptoms.  This pt does have a history of CAD but does not take any prescribed cardiac medications.  I made the pt aware that she can take claritin, Zyrtec and Benadryl as needed for symptoms.  The pt can try decongestants for her symptoms but I made her aware that she may have an increased pulse and BP with these meds.  I also made the pt aware that she can try Coricidin HBP for symptoms.  The pt will call back with any other questions or concerns.

## 2011-02-15 NOTE — Assessment & Plan Note (Signed)
[facility]                            CARDIOLOGY OFFICE NOTE   NYKERIA, MEALING                  MRN:          [id number]  DATE:11/29/2007                            DOB:          1941-01-20    HISTORY:  Victoria [name] was seen in hospital followup at [facility]  Cardiology office on November 29, 2007.  She was admitted at [facility] on February 7 with a non-ST-elevation MI and was discharged  home on February 10.  She underwent emergent cardiac cath early in the  morning of February 8 with increasing cardiac enzymes and ongoing chest  discomfort.  Her EKG was nondiagnostic.  Cardiac catheterization  demonstrated severe stenosis in a small branch of the left circumflex  with moderate disease in the LAD and mild disease in the right coronary  artery.  She had a very focal akinetic segment in the lateral wall at  ventriculography with overall preserved LV function and an estimated EF  of 60-65%.  She underwent balloon angioplasty of a small obtuse marginal  branch and had good angiographic result after PTCA.  The vessel was too  small to stent.   Victoria [name] has been doing reasonably well since her discharge home  from the hospital.  She has had no recurrent chest pain and has not had  any symptoms that are similar to those at her presentation.  She has had  some sharp, fleeting pains in the mid back area.  These have been  nonexertional.  Her main complaint is generalized fatigue.  She feels  extremely fatigued since her discharge and relates this to medication  side effect.  She has not been able to exercise or do much of anything  because of tiredness.  She has had no orthopnea, PND, dyspnea or edema.   MEDICATIONS:  1. Include aspirin 81 mg daily.  2. Plavix 75 mg daily.  3. Levothyroxine 125 mcg daily.  4. Fosamax 70 mg weekly.  5. Crestor 5 mg daily.  6. Coenzyme Q 10 daily.  7. Fish oil daily.  8. Calcium plus D 2000  international units.  9. Multivitamin.  10.Metoprolol 25 mg twice daily.  11.Ocuvite.   ALLERGIES:  Include LATEX, PENICILLIN, TETRACYCLINE, CIPRO,  ERYTHROMYCIN, SHELLFISH.   PHYSICAL EXAMINATION:  GENERAL:  The patient is alert and oriented.  She  is in no acute distress.  VITAL SIGNS:  Weight is 138, blood pressure 116/70, heart rate 75,  respiratory rate 16.  HEENT:  Normal.  NECK:  Normal.  Carotid upstrokes without bruits.  Jugular venous  pressure normal.  LUNGS:  Clear to auscultation bilaterally.  HEART:  The apex is discrete, nondisplaced.  Heart is regular rate and  rhythm without murmurs or gallops.  ABDOMEN:  Soft, nontender.  No organomegaly.  No bruits.  EXTREMITIES:  No clubbing, cyanosis or edema.  Peripheral pulses 2+ and  equal throughout   EKG shows normal sinus rhythm with a right IVCD and left axis deviation.   Lab work reviewed from her hospitalization demonstrated a normal TSH.  Total cholesterol was 177, HDL 55, LDL 114.  Her troponin peaked at 21  and total CK peaked at 1187 with an MB peak of 126.   ASSESSMENT:  1. Coronary artery disease and recent non-ST-elevation myocardial      infarction.  Victoria [name] is having no angina.  Will continue dual      antiplatelet therapy with aspirin and Plavix.  She has had problems      with statins but is tolerating low dose Crestor.  I think she is      having side effects from metoprolol and she has extreme fatigue.  I      have asked her to hold her metoprolol and see if her symptoms      resolve.  Clearly she will gain long term benefit from a standpoint      of cardiac risk reduction but her symptoms are intolerable and I      think this medication should be held.  If this does not make a      significant impact on her fatigue then certainly she should be      restarted on metoprolol.  I would like to see her back in 6 weeks      to reassess and will see how her fatigue responds to holding her       beta-blocker.  2. Dyslipidemia, followup lipids and LFTs at the time of her return      visit.  3. As above.  Will follow up with Victoria [name] in approximately 6      weeks.     Veverly Fells. Excell Seltzer, MD  Electronically Signed    MDC/MedQ  DD: 11/29/2007  DT: 11/30/2007  Job #: [id number]   cc:   Georgianne Fick, M.D.  Bernette Redbird, M.D.

## 2011-02-15 NOTE — Assessment & Plan Note (Signed)
[facility]                            CARDIOLOGY OFFICE NOTE   EDMUND, RICK                  MRN:          [id number]  DATE:07/29/2008                            DOB:          [birth year]    REASON FOR VISIT:  Chest pain with underlying CAD.   HISTORY OF PRESENT ILLNESS:  Victoria [name] is a 70 year old woman who was  hospitalized in February of this year with a non-ST-elevation MI.  She  underwent angioplasty of a small obtuse marginal branch vessel, which  appeared to be her infarct vessel.  She had only minor nonobstructive  disease in the right coronary artery and had a moderate LAD stenosis of  50-60%, visualized at that time.  Following hospital discharge, she has  had periodic episodes of chest pain.  However, over the last month, she  reports increased chest pain at rest.  She really does not have symptoms  with exertion.  However, on a nearly daily basis, she has developed left-  sided chest pain radiating to the left neck and left arm, that feels  like a pressure.  She generally takes aspirin and her symptoms resolved  within 20 minutes.  Her symptoms have not been responsive to  nitroglycerin.  She complains of mild exertional dyspnea, as well as  generalized fatigue.  She denies orthopnea, PND, edema, palpitations,  lightheadedness, or syncope.   MEDICATIONS:  1. Aspirin 81 mg daily.  2. Plavix 75 mg daily.  3. Levothyroxine 125 mcg daily.  4. Coenzyme Q10, 100 mg daily.  5. Fish oil 1 daily.  6. Calcium plus D daily.  7. Multivitamin daily.  8. Benadryl at bedtime.  9. Crestor 10 mg daily.  10.P.r.n. medications include sublingual nitroglycerin.   ALLERGIES:  Drug allergies are multiple and include LATEX, PENICILLIN,  TETRACYCLINE, ERYTHROMYCIN, CIPRO and SHELLFISH.   PHYSICAL EXAMINATION:  GENERAL:  She is alert and oriented, in no acute  distress.  VITAL SIGNS:  Weight 145 pounds, blood pressure is 138/84, heart  rate  64, respiratory rate is 16.  HEENT:  Normal.  NECK:  Normal carotid upstrokes.  No bruits.  JVP normal.  LUNGS:  Clear bilaterally.  HEART:  Regular rate and rhythm.  No murmurs or gallops.  ABDOMEN:  Soft, nontender, no organomegaly.  EXTREMITIES:  No clubbing, cyanosis, or edema.  Peripheral pulses are  intact and equal.  SKIN:  Warm and dry without rash.   EKG shows sinus rhythm with left ward axis.  There are no significant ST-  segment or T-wave changes.   ASSESSMENT:  1. Coronary artery disease status post non-ST elevation myocardial      infarction.  I am concerned about Victoria [name]'s recurrent symptoms      of chest pain.  While they are nonexertional, they certainly have      typical characteristics of anginal pain.  We will perform an      exercise rest/stress Myoview scan to rule out significant ischemia.      Of note, she did have moderate left anterior descending (coronary      artery) stenosis at the  time of her catheterization and certainly,      I would like to rule out significant anterior wall ischemia.  Her      symptoms sound consistent with vasospastic angina.  After she      completes her stress test we will give her a trial of diltiazem CD      120 mg daily.  She has been intolerant to beta blockers secondary      to profound fatigue.  Since her angina is not nitrate responsive,      we will try a calcium channel blocker instead.  2. Dyslipidemia.  Lipids from July showed a cholesterol of 149,      triglycerides 46, HDL 49, and LDL 90.  We will continue Crestor.   For followup, I would like to see the patient back in 6 months.  We will  followup after her Myoview results are available.     Veverly Fells. Excell Seltzer, MD  Electronically Signed    MDC/MedQ  DD: 07/29/2008  DT: 07/30/2008  Job #: [id number]

## 2011-02-15 NOTE — Cardiovascular Report (Signed)
Victoria [name], BRAND NO.:  [id number]   MEDICAL RECORD NO.:  [id number]          PATIENT TYPE:  OBV   LOCATION:  2908                         FACILITY:  [facility]   PHYSICIAN:  Veverly Fells. Excell Seltzer, MD  DATE OF BIRTH:  1941/03/05   DATE OF PROCEDURE:  11/11/2007  DATE OF DISCHARGE:                            CARDIAC CATHETERIZATION   PROCEDURE:  Left heart catheterization, selective coronary angiography,  left ventricular angiography, PTCA of OM-2 and Angio-Seal of the right  femoral artery.   INDICATIONS:  Victoria [name] is a 70 year old woman who presented earlier  today with an acute coronary syndrome.  She had severe substernal chest  pain that resolved in a relatively short period of time.  Her initial  biomarkers were negative and her EKG was nondiagnostic with no  significant ST-segment elevation.  Her second set of biomarkers returned  markedly positive with a troponin greater than 20.  She developed a dull  substernal chest pain and we activated the cath lab emergently in the  setting of her non ST elevation MI with ongoing ischemia.   Risks and indications of the procedure were reviewed with the patient.  Informed consent was obtained.  The right groin was prepped, draped,  anesthetized with 1% lidocaine.  Using the modified Seldinger technique  a 6-French sheath was placed in the right femoral artery.  Standard 6-  French Judkins catheters were used for selective coronary angiography.  An angled pigtail catheter was used for left ventriculography.  Left  ventriculography was performed in both the RAO and LAO projection.   At the conclusion of the diagnostic procedure, I elected to intervene on  the second OM branch of the left circumflex.  There was a 95% stenosis  and I suspected this was the culprit vessel.  The patient had a focal  akinetic wall motion abnormality involving the lateral wall.  There was  no other critical disease seen.  The vessel was very  small, but I  thought it was amenable to at least balloon angioplasty with a small  balloon.  Angiomax was used for anticoagulation.  The patient had been  premedicated with 600 mg of Plavix.  Once a therapeutic ACT was  achieved, a cougar guidewire was used to wire the lesion and it was  placed in the distal OM branch.  The stenosis was tight enough that  there was no flow in the vessel, just with the wire across the lesion.  The lesion was predilated with a 1.5 x 15 mm Maverick balloon.  The  first inflation was taken to 6 atmospheres.  The balloon was then  inflated again to 8 atmospheres for 1 minute.  The balloon was removed  and intracoronary nitroglycerin was given.  Angiography was performed in  multiple projections.  There was a reasonably good balloon result and  the vessel was clearly not even a 2 mm vessel.  I therefore elected not  to stent the vessel.  There was TIMI III flow and the patient was chest  pain-free.  There was approximately 20% residual stenosis from an  initial stenosis of 95%.  At  the completion of the procedure, an Angio-  Seal device was used to seal the femoral arteriotomy.   FINDINGS:  Aortic pressure 145/77 with a mean of 108.  Left ventricular  pressure 144/19.   CORONARY ANGIOGRAPHY:  Left mainstem:  The left mainstem is moderately  calcified.  There is minimal plaque throughout the body of the left  main.  There is minor tapering at the distal left main just at the  bifurcation of the LAD and left circumflex.  There is an approximate 20%  stenosis at the distal left main bifurcation.   LAD:  The LAD is a heavily calcified vessel.  It is a large vessel that  wraps around the LV apex.  The proximal LAD first septal perforator has  a calcified 50-60% stenosis.  Just beyond this area at the origin of the  first diagonal branch there is a second 50% stenosis that extends to the  second diagonal branch.  The remaining portions of the vessel have mild   nonobstructive plaque, but no significant angiographic stenosis.   Left circumflex:  The left circumflex is a moderate size vessel  proximally.  After the first two marginal branches, the vessel tapers  into a much smaller vessel.  The first OM branch is a small vessel and  has a 70% proximal stenosis.  The second OM branch is also a small  vessel, but divides into two branches that supply the lateral wall.  There is a 95% stenosis just before the bifurcation of those two  branches.  There is TIMI III flow in the circumflex.  The circumflex is  also moderately calcified.   Right coronary artery:  The right coronary artery has mild diffuse  calcification.  There is 20% stenosis throughout the proximal portion.  The mid and distal right coronary artery have no significant  angiographic stenosis.  Distally the vessel bifurcates into a small PDA  and a larger posterolateral branch that have no significant stenosis.   Ventriculography was performed in the RAO and LAO projection.  In the  RAO projection left ventricular wall segments are all normal.  In the  LAO projection there is clearly of focal akinetic segment of the lateral  wall.  The overall LVEF is preserved at 60-65%.  There is no mitral  regurgitation.   ASSESSMENT:  1. Severe second obtuse marginal stenosis with successful percutaneous      transluminal coronary angioplasty.  2. Moderate diffuse left anterior descending coronary artery stenosis.  3. Mild right coronary artery stenosis.  4. Focal lateral wall akinesis with preserved overall left ventricular      function.   PLAN:  Victoria [name] should continue on aspirin and Plavix for 12 months  in the setting of her non ST elevation MI.  Will institute medical  therapy for her residual CAD.  As noted above, PTCA alone was performed  because the OM-2 was too small for a stent.  She has moderate LAD  stenosis that I think will respond well to medical therapy.      Veverly Fells. Excell Seltzer, MD  Electronically Signed     MDC/MEDQ  D:  11/11/2007  T:  11/12/2007  Job:  [id number]

## 2011-02-15 NOTE — Assessment & Plan Note (Signed)
[facility]                            CARDIOLOGY OFFICE NOTE   LYNNLEE, REVELS                  MRN:          [id number]  DATE:01/07/2008                            DOB:          07/23/1941    Victoria [name] was seen in follow up at the [facility] office  on January 07, 2008.  Ms. Laux is a delightful 70 year old woman with  coronary artery disease who was hospitalized in February with a non-ST-  elevation MI.  She underwent angioplasty of a small OM branch of the  left circumflex that was consistent with her infarct vessel.  At the  time, her LV function was evaluated with ventriculography, and it was  found to be intact with an LVEF of 60-65%.   Ms. Breuer is doing very well from a symptomatic standpoint.  When I  saw her last in February, she had severe fatigue, and I elected to  discontinue her metoprolol at that point.  She feels much better since  coming off of metoprolol.  Her energy level has improved, and she denies  any chest pain, dyspnea, orthopnea, PND, edema, lightheadedness, syncope  or palpitations.  She really has no complaints on exam today.   CURRENT MEDICATIONS:  1. Aspirin 81 mg daily.  2. Plavix 75 mg daily.  3. Levothyroxine 125 mcg daily.  4. Fosamax 75 mg weekly.  5. Crestor 5 mg daily.  6. Coenzyme Q 10, 100 mg daily.  7. Fish oil one daily.  8. Calcium Plus D 2000 international units daily.  9. Multivitamin daily.  10.Benadryl at bedtime.   ALLERGIES:  Multiple and include:  1. LATEX.  2. PENICILLIN.  3. TETRACYCLINE.  4. CIPRO.  5. ERYTHROMYCIN.  6. SHELLFISH.   PHYSICAL EXAMINATION:  GENERAL:  The patient is alert and oriented.  She  is in no acute distress.  VITAL SIGNS:  Weight is 141.  Blood pressure is 130/86, heart rate 80,  respiratory rate 16.  HEENT:  Normal.  NECK:  Normal carotid upstrokes without bruits.  Jugular venous pressure  is normal.  LUNGS:  Clear bilaterally.  HEART:  Regular rate and rhythm without murmurs or gallops.  ABDOMEN:  Soft, nontender.  No organomegaly.  No bruits.  EXTREMITIES:  No clubbing, cyanosis or edema.  Peripheral pulses 2+ and  equal throughout.   LABORATORY DATA:  Lab work from January 04, 2008 showed normal LFTs.  Total  cholesterol 153, triglycerides 89, HDL 43, LDL 93.   ASSESSMENT:  1. Coronary artery disease status post angioplasty of the left      circumflex branch.  Ms. Sarnowski remains stable with no anginal      symptoms.  Will continue dual antiplatelet therapy with aspirin and      Plavix.  I discussed enrollment in the TRA-2P study but she      declined.  I would like to see her back in 6 months for follow up.  2. Dyslipidemia.  Lipids are under reasonable control on Crestor 5,      but her LDL was above goal.  She has had a good bit  of difficulty      with statin intolerance in the past, and she is doing well with low-      dose Crestor and coenzyme Q 10 for her myalgias.  I have asked her      to try to increase her Crestor to 10 mg daily, but if she has      problems, she can reduce the dose back to her current 5 mg dosage.   As above, for follow up will check lipids and LFTs in 3 months with the  increased dose of Crestor and a follow up office visit in 6 months.  She  requested that her TSH be checked when her blood work is drawn next,  since she has hypothyroidism that is treated with Synthroid.     Veverly Fells. Excell Seltzer, MD  Electronically Signed    MDC/MedQ  DD: 01/07/2008  DT: 01/07/2008  Job #: [id number]   cc:   Georgianne Fick, M.D.  Bernette Redbird, M.D.

## 2011-02-15 NOTE — H&P (Signed)
Victoria [name], Victoria [name] NO.:  [id number]   MEDICAL RECORD NO.:  [id number]          PATIENT TYPE:  INP   LOCATION:  2019                         FACILITY:  [facility]   PHYSICIAN:  Gerrit Friends. Dietrich Pates, MD, FACCDATE OF BIRTH:  1941/06/01   DATE OF ADMISSION:  11/10/2007  DATE OF DISCHARGE:  11/13/2007                              HISTORY & PHYSICAL   The patient requests that her cardiologist be Dr. Diona Browner.   PRIMARY CARE PHYSICIAN:  Her primary care physician is Dr. Nicholos Johns.  Her GI physician is Dr. Matthias Hughs.   BRIEF HISTORY:  Ms. Victoria [name] is a 70 year old white female who presents  to the emergency room complaining of chest discomfort.   She describes a long history of anterior dull hurting since the '80s.  The frequency varies and could be 1-2 times a week, 1 time a month, 1  time every 6 months.  She would usually give it a 2-3 on a scale of 0-10  and it would usually last less than 20 minutes after she would take an  aspirin.  She says she has seen a cardiologist in the past in the '80s  and they told her it was a problem with the sac around her heart.  She  has also been seen in the ER for this discomfort she recalls back in  September 2007 and she remembers some testing there that said that  everything was okay.  She has also discussed this in her past with her  remote family physicians.  She states occasionally it is sore and  occasionally radiates to her back.  She has not had any changes in her  usual pattern until today.  She states she developed the same discomfort  but it was a lot worse and came it a 10 on a scale of 0-10.  This  occurred at approximately 1420 hours while talking with friends and  occurred suddenly.  She had associated nausea and diaphoresis.  She  denied shortness of breath but felt it her to take a deep breath.  She  took three aspirin without any change.  EMS was called, however, the  report is not available.  According to the  family she received two  sublingual nitroglycerin without change.  After she received some  morphine it went to a 3 on a scale of 0-10.  She states that she feels  like she is sore inside.  All in all today's discomfort has lasted less  than 2 hours according to the patient.  She denies any recent accidents  or injuries and feels that it is similar to her past discomfort.   ALLERGIES:  Is notable for allergies to LATEX, PENICILLIN, TETRACYCLINE,  CIPRO, ERYTHROMYCIN, SHELLFISH.   ADMISSION MEDICATIONS:  1. Medications prior to admission included generic Fosamax unknown      dosage and frequency.  2. Synthroid 125 daily.  3. Aspirin 325 mg daily.  4. Benadryl p.r.n.  5. Calcium 600 mg daily.  6. Vitamin D 1000 units daily.  7. Multivitamin daily.  8. Fish oil unknown dosage and frequency.  9. CoQ10 100 daily.  10.Crestor  5 mg half a tablet daily.  11.She states that she also used some kind of cream that contains      steroids for her skin.   PAST MEDICAL HISTORY:  Is notable for a negative colonoscopy in 2002.  She states that she had a TIA in 1997.  It is not clear if she has  residual weakness, however, she denies any residual aphasia.  She has  had history of hyperthyroidism since the '80s, bilateral carpal tunnel  repair, dorsal release.  She also had a staph infection post a hip  surgery in the '80s and a hysterectomy.  She specifically denies any  history of diabetes, hypertension, myocardial infarction, CVA, bleeding  dyscrasias or elevated cholesterol.   SOCIAL HISTORY:  She resides in [place] with her husband.  She has 2  children and 2 grandchildren.  She is retired from [employer].  She  also used to be a high school Retail buyer.  She has never smoked.  She drinks a daily glass of wine.  She denies any drugs or herbal  medications, diet or exercise.   FAMILY HISTORY:  Her mother died in her 70s with pancreatic cancer, her  father in his 64s with a CVA.   She has 1 brother living.  She does not  know his health.   REVIEW OF SYSTEMS:  Is notable for chills preceding Thursday and Friday,  sinus problems, reading glasses, positive snoring.  However, she denies  OSA, nocturia, postmenopausal, depression around the holidays that she  feels is not unusual, arthralgias in both her knees.   PHYSICAL EXAMINATION:  GENERAL:  A well-nourished, well-developed,  pleasant white female in no apparent distress.  VITAL SIGNS:  Blood pressure is 94/57, pulse 63, respirations 20, 99%  sat on room air.  HEENT:  Is unremarkable.  NECK:  Supple without thyromegaly, adenopathy, JVD or carotid bruits.  CHEST:  Symmetrical excursion.  Clear to auscultation without rales,  rhonchi or wheezing.  HEART:  PMI is not displaced.  Regular rate and rhythm.  Do not  appreciate any murmurs, rubs, clicks or gallops.  All pulses were  symmetrical and intact without abdominal or femoral bruits.  SKIN:  Integument appears to be intact.  ABDOMEN:  Slightly rounded.  Bowel sounds present without organomegaly,  masses or tenderness.  EXTREMITIES:  Negative cyanosis, clubbing or edema.  MUSCULOSKELETAL:  Grossly unremarkable.  However, she does have some  tenderness over the left breast which is different than her chest  discomfort.  NEUROLOGICAL:  Is unremarkable.   Chest x-ray showed no active disease.  EKG showed normal sinus rhythm.  Normal axis.  Insignificant inferior Q-waves.  Slight ST-segment  depression only in lead V2, nonspecific.  No essential changes from EKG  in 2006.  An I STAT showed an H and H of 12.6 and 37.  Sodium 138,  potassium 3.8, BUN 13, creatinine 123.  Point of care marker negative  times one.   IMPRESSION:  1. Atypical chest discomfort.  2. Hyperglycemia.  3. Left breast tenderness of uncertain etiology.  History as noted per      past medical history.  4. Hypertension, sinus brady.   DISPOSITION:  Dr. Dietrich Pates also reviewed the patient's  past medical  history, spoke with and examined the patient and agrees with the above.  He feels that her recurrent chest discomfort in the past was probably  diagnosed as presumed pericarditis.  However, history and EKG in the ER  is  unrevealing.  We will admit her overnight for  observation to rule out myocardial infarction.  If in fact she does rule  out for myocardial infarction we will arrange an outpatient stress  Myoview and have her follow up with Dr. Diona Browner.  She requests Dr.  Diona Browner as her husband follows with him.  We will not begin a beta-  blocker at this time given her hypotension and bradycardia.      Joellyn Rued, PA-C      Gerrit Friends. Dietrich Pates, MD, [facility]  Electronically Signed    EW/MEDQ  D:  11/12/2007  T:  11/14/2007  Job:  [id number]   cc:   Bernette Redbird, M.D.  Georgianne Fick, M.D.

## 2011-02-15 NOTE — Discharge Summary (Signed)
NAMEALISCIA, Victoria [name] NO.:  [id number]   MEDICAL RECORD NO.:  [id number]          PATIENT TYPE:  INP   LOCATION:  2019                         FACILITY:  [facility]   PHYSICIAN:  Veverly Fells. Excell Seltzer, MD  DATE OF BIRTH:  [birth year]   DATE OF ADMISSION:  11/10/2007  DATE OF DISCHARGE:  11/13/2007                               DISCHARGE SUMMARY   PROCEDURES:  1. Left heart catheterization.  2. Coronary arteriogram.  3. Left ventriculogram.  4. PTCA of the OM-2.  5. Angio-Seal the right femoral artery.   PRIMARY FINAL DISCHARGE DIAGNOSIS:  Non-ST segment elevation myocardial  infarction.   SECONDARY DIAGNOSES:  1. Residual coronary artery disease in the left anterior descending at      60% and in the first obtuse marginal at 70%, medical therapy      recommended.  2. Preserved left ventricular function with an ejection fraction of      60% (focal lateral akinesis).  3. Dyslipidemia with a total cholesterol of 146, triglycerides 40, HDL      50, LDL 88, Crestor increased.  4. Hypothyroidism  5. History of transient ischemic attack in 1997.  6. Status post bilateral carpal tunnel surgery with dorsal release.  7. History of hip surgery in the 1980s with a staph infection      postoperatively.  8. Status post hysterectomy  9. Status post colonoscopy, normal.  10.ALLERGY OR INTOLERANCE TO LATEX PENICILLIN, TETRACYCLINE, CIPRO,      ERYTHROMYCIN AND SHELLFISH.   TIME AT DISCHARGE:  45 minutes.   HOSPITAL COURSE:  Victoria [name] is a 70 year old female with no previous  history of coronary artery disease.  She has a long history of  intermittent chest pain.  On the day of admission she had sudden onset  of chest pain at 1420.  EMS was called and her pain decreased from a  10/10 to a 3/10 with sublingual nitroglycerin and morphine.  She was  brought to the hospital and admitted for further evaluation.   Her initial point of care markers were negative.  However,  follow-up  cardiac enzymes were significantly elevated with a troponin 26.33 and a  CK-MB that peaked at 1187/125.7.  She was taken to the cath lab on  November 11, 2007 and the heart catheterization showed a 95% OM-2 treated  with PTCA, reducing the stenosis to less than 20%.  Residual coronary  artery disease in the LAD at 60 and 50%, OM-1 70% and RCA 20% is to be  treated medically.  Her EF was 60%.   A lipid profile was performed with results described above.  Dr. Excell Seltzer  felt that she could increase her Crestor and coenzyme Q10 for  management.  She can continue the fish oil as taken prior to admission.  She had a beta blocker added to her medication regimen as well as  Plavix.  Because of the MI, she is to be on Plavix for a prolonged  period of time.  Her blood sugars are elevated up to 148 but her  hemoglobin A1c was within normal limits at 5.4.  She is encouraged  to  eat a heart-healthy diet.  Thyroid function tests including a TSH 3.198  and free T4 were within normal limits at 1.22.  T3 uptake was slightly  elevated at 43.8%.  She is to follow up with her family physician.   Victoria [name] was seen by cardiac rehab and is referred to outpatient  cardiac rehab as well.  By November 13, 2007 she was ambulating without  chest pain or shortness of breath.  Dr. Excell Seltzer evaluated her and felt  that she could be safely discharged home and follow up as an outpatient.   DISCHARGE INSTRUCTIONS:  Her activity level is to be increased gradually  with no driving for 4 days and no lifting for 2 weeks.  She is to call  our office for any problems with the cath site.  She is to follow up  with Dr. Excell Seltzer on February 26 at 11 a.m. and with Dr. Nicholos Johns as  needed.   DISCHARGE MEDICATIONS:  1. Aspirin 81 mg daily.  2. Plavix 75 mg daily.  3. Nitroglycerin sublingual p.r.n.  4. Levothyroxine 125 mcg daily  5. Fosamax weekly.  6. Crestor 5 mg daily  7. Coenzyme Q 10 daily.  8. Fish oil,  calcium and vitamin D as well as multivitamins as prior      to admission.  9. Steroid cream p.r.n.  10.Metoprolol 25 mg b.i.d.Theodore Demark, PA-C      Veverly Fells. Excell Seltzer, MD  Electronically Signed    RB/MEDQ  D:  11/13/2007  T:  11/14/2007  Job:  [id number]   cc:   Georgianne Fick, M.D.

## 2011-02-18 NOTE — Op Note (Signed)
[name], [name]           ACCOUNT NO.:  [id number]   MEDICAL RECORD NO.:  [id number]          PATIENT TYPE:  AMB   LOCATION:  DSC                          FACILITY:  [facility]   PHYSICIAN:  Katy Fitch. Sypher Montez Hageman., M.D.DATE OF BIRTH:  1941-08-19   DATE OF PROCEDURE:  02/03/2005  DATE OF DISCHARGE:                                 OPERATIVE REPORT   PREOPERATIVE DIAGNOSIS:  Chronic stenosing tenosynovitis, right first dorsal  compartment.   POSTOPERATIVE DIAGNOSIS:  Chronic stenosing tenosynovitis, right first  dorsal compartment.   OPERATION:  Release of right first dorsal compartment.   OPERATING SURGEON:  Katy Fitch. Sypher, M.D.   ASSISTANT:  Jonni Sanger, P.A.   ANESTHESIA:  General by LMA.   SUPERVISING ANESTHESIOLOGIST:  Bedelia Person, M.D.   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 She is referred by Dr. Dara Hoyer for evaluation and  management of painful right wrist. Clinical examination revealed swelling of  the first dorsal compartment with signs of stenosis tenosynovitis. She has  failed nonoperative measures.   Due to a failure to respond to nonoperative measures, she is brought to the  operating room at this time for release of right first dorsal compartment.   PROCEDURE:  Victoria [name] was brought to operating room and placed  supine position on the table. Following induction of general anesthesia by  LMA, the right arm was prepped with Betadine soap solution and sterilely  draped. A pneumatic tourniquet was put on about the proximal brachium.  Following exsanguination of the limb with Esmarch bandage, arterial  tourniquet at the proximal brachium was inflated to 200 mmHg. Procedure  commenced with short transverse incision directly over the apex of the  swollen first dorsal compartment. Subcutaneous tissues were gently divided,  revealing the radial sensory branches. These were retracted with  blunt  retractors.   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 This was removed with a  rongeur. The compartment was then split with a scalpel and scissors. Two  slips of the abductor pollicis longus are identified. Careful inspection  revealed a second discrete dorsal compartment for the extensor pollicis  brevis, with a 3 mm wall between the two compartments.  This was carefully  incised and the septum between the two compartments resected.   The wound was then inspected for bleeding points, followed by repair of the  skin with intradermal 3-0 Prolene suture. A compressive dressing applied  with Xeroflo, sterile gauze and Ace wrap.   We have encouraged [name] to begin immediate range of motion  exercises. She is provided prescription for Darvocet N 100 one p.o. q.4-6h.  p.r.n. pain (20 tablets without refill). She will return to our office for  follow-up in one week for dressing change and advancement to a therapy  program.      RVS/MEDQ  D:  02/03/2005  T:  02/03/2005  Job:  [id number]

## 2011-02-18 NOTE — Op Note (Signed)
NAME:  Victoria [name], FRESE                     ACCOUNT NO.:  [id number]   MEDICAL RECORD NO.:  [id number]                   PATIENT TYPE:  AMB   LOCATION:  DSC                                  FACILITY:  [facility]   PHYSICIAN:  Katy Fitch. Naaman Plummer., M.D.          DATE OF BIRTH:  [birth year]   DATE OF PROCEDURE:  02/12/2004  DATE OF DISCHARGE:                                 OPERATIVE REPORT   PREOPERATIVE DIAGNOSIS:  Chronic entrapment neuropathy, left median nerve,  at level of carpal tunnel, left wrist.   POSTOPERATIVE DIAGNOSIS:  Chronic entrapment neuropathy, left median nerve,  at level of carpal tunnel, left wrist.   OPERATION:  Release of left transverse carpal ligament.   OPERATING SURGEON:  Katy Fitch. Sypher, M.D.   ASSISTANT:  Nurse.   ANESTHESIA:  General by LMA, supervising anesthesiologist is Maren Beach, M.D.   INDICATIONS:  [name] is a 70 year old woman referred for  evaluation and management of bilateral hand numbness.  She is status post  release of her right transverse carpal ligament with a very satisfactory  response.  Electrodiagnostic studies confirm significant median neuropathy  on the left.  She is now scheduled for release of her left transverse carpal  ligament.   PROCEDURE:  [name] is brought to the operating room and placed in  the supine position on the operating table.  Following general anesthesia  under the supervision of Maren Beach, M.D., the left arm was prepped  with Betadine soap and solution and sterilely draped.   Following exsanguination of the limb with an Esmarch bandage, an arterial  tourniquet on the proximal brachium was inflated at 220 mmHg.   The procedure commenced with a short incision in the line of the ring finger  in the palm.  The subcutaneous tissues were carefully divided to reveal the  palmar fascia.  This was split in line with its fibers to reveal the common  sensory branch of the median  nerve and the superficial palmar arch.   The median nerve was gently isolated and separated from the transverse  carpal ligament.  The ligament was then released along its ulnar border  extending into the distal forearm.   This widely opened the carpal canal.   No masses or other predicaments were noted.   Bleeding points along the margin of the released ligament were  electrocauterized with bipolar current.   The wound was then repaired with intradermal 3-0 Prolene suture.  Marcaine  0.25% was infiltrated for postoperative analgesia followed by dressing the  wound with a Steri-Strip, sterile gauze, sterile Webril, and a volar plaster  splint maintaining the wrist in 5 degrees of dorsiflexion.   Ms. Norcia tolerated the surgery and anesthesia well.  She was transferred  to the recovery room with stable vital signs.  Katy Fitch Naaman Plummer., M.D.    RVS/MEDQ  D:  02/12/2004  T:  02/13/2004  Job:  [id number]

## 2011-02-18 NOTE — Op Note (Signed)
NAME:  Victoria [name], Victoria [name]                     ACCOUNT NO.:  [id number]   MEDICAL RECORD NO.:  [id number]                   PATIENT TYPE:  AMB   LOCATION:  DSC                                  FACILITY:  [facility]   PHYSICIAN:  Katy Fitch. Naaman Plummer., M.D.          DATE OF BIRTH:  [birth year]   DATE OF PROCEDURE:  01/22/2004  DATE OF DISCHARGE:                                 OPERATIVE REPORT   PREOPERATIVE DIAGNOSIS:  Entrapment neuropathy, median nerve, right carpal  tunnel.   POSTOPERATIVE DIAGNOSIS:  Entrapment neuropathy, median nerve, right carpal  tunnel.   PROCEDURE:  Release of right transverse carpal ligament.   SURGEON:  Katy Fitch. Sypher, M.D.   ASSISTANT:  Marveen Reeks Dasnoit, P.A.-C,   ANESTHESIA:  General by LMA.   SUPERVISING ANESTHESIOLOGIST:  Kaylyn Layer. Michelle Piper, M.D.   INDICATIONS:  Victoria [name] is a 70 year old woman referred through the  courtesy of Victoria [name], M.D., at [facility].   She has had a history of hand numbness consistent with carpal tunnel  syndrome.  Clinical examination and elective diagnostic studies confirmed  the presence of significant carpal tunnel syndrome.  She has not responded  to nonoperative measures.   Due to a failure to improve, she is brought to the operating room at this  time for release of her right transverse carpal ligament.   PROCEDURE IN DETAIL:  Victoria [name] is brought to the operating room and  placed in the supine position on the operating room table.  Following the  induction of general anesthesia by LMA, the right arm was prepped with  Betadine soap and solution, and sterilely draped.   Following exsanguination of the right arm with an Esmarch bandage and an  arterial tourniquet on the proximal brachium, was inflated to 220 mmHg.   Procedure commenced with a short incision in the line of the ring finger of  the palm.  Subcutaneous tissues were carefully divided, revealing the palmar  fascia.   This was split longitudinally to reveal the compressed branch of  the median nerve.   These were followed back to the transverse carpal ligament which was  carefully isolated from the median nerve.   The ligament was released along its ulnar border, extending into the distal  forearm.   This widely opened the carpal canal.   Bleeding points of the margin of the released ligament were  electrocauterized by bipolar current, followed by repair of the skin with  intradermal 3-0 Prolene suture.   A compressive dressing was applied with a volar plaster splint, maintaining  the wrist in 5 degrees of dorsiflexion.   PREOPERATIVE CARE:  Victoria [name] is advised to elevate her hand for the next  three days.  She will begin immediate range of motion exercises.  She will  avoid heavy lifting for two weeks.  She will return to our office for  followup in 7-8 days, for dressing change and suture removal.  Katy Fitch Naaman Plummer., M.D.    RVS/MEDQ  D:  01/22/2004  T:  01/22/2004  Job:  [id number]   cc:   Victoria [name], M.D.  [id number]  [place]  [place] [address]  Fax: [phone]   Katy Fitch. Naaman Plummer., M.D.  [address]  [place]  [place] [address]  Fax: [phone]

## 2011-02-18 NOTE — Procedures (Signed)
[facility]  Patient:    Victoria [name], Victoria [name] Visit Number: [id number] MRN: [id number]          Service Type: END Location: ENDO Attending Physician:  Rich Brave Dictated by:   Florencia Reasons, M.D. Proc. Date: 07/19/01 Admit Date:  07/19/2001   CC:         Tyson Dense, M.D.  Daniel L. Eda Paschal, M.D.   Procedure Report  PROCEDURE:  Colonoscopy.  INDICATION:  A 70 year old with family history of colon cancer in her mother, but without worrisome symptoms.  FINDINGS:  Normal exam to the cecum.  DESCRIPTION OF PROCEDURE:  The nature, purpose, and risks of the procedure had been discussed with the patient, who provided written consent.  The procedure was done with latex allergy precautions.  The patient states that there is a possible mild allergy to latex characterized by discomfort around her elastic waistband but no history of any anaphylactic reaction to latex.  Sedation was fentanyl 55 mcg and Versed 6 mg IV without arrhythmias or desaturation.  The Olympus adjustable-tension pediatric video colonoscope was readily advanced to the cecum as identified by visualization of the appendiceal orifice and the absence of further lumen, after which pullback was performed.  The quality of the prep was very good, and it was felt that all areas were well-seen.  This was a normal examination.  No polyps, cancer, colitis, vascular malformations, or diverticular disease were observed, and retroflexion in the rectum was normal.  No biopsies were obtained.  The patient tolerated the procedure well, and there were no apparent complications.  IMPRESSION:  Normal screening colonoscopy in a patient with family history of colon cancer.  PLAN:  Follow-up colonoscopy in five years because of the family history. Dictated by:   Florencia Reasons, M.D. Attending Physician:  Rich Brave DD:  07/19/01 TD:  07/20/01 Job:  [id number]

## 2011-03-03 ENCOUNTER — Telehealth: Payer: Self-pay | Admitting: Cardiovascular Disease

## 2011-03-03 DIAGNOSIS — E039 Hypothyroidism, unspecified: Secondary | ICD-10-CM

## 2011-03-03 DIAGNOSIS — M810 Age-related osteoporosis without current pathological fracture: Secondary | ICD-10-CM

## 2011-03-03 NOTE — Telephone Encounter (Signed)
Pt. States she has an appointment for Lipids and LFT on Monday 03/07/11 She would like to add TSH and Vitamin D to the labs. Pt states Dr. Excell Seltzer and her PCP Dr.Elizabeth Duanne Guess share blood work. Labs added for Monday 03/07/11 Patient aware.

## 2011-03-03 NOTE — Telephone Encounter (Signed)
Pt wants her B12 checked for another MD

## 2011-03-07 ENCOUNTER — Other Ambulatory Visit (INDEPENDENT_AMBULATORY_CARE_PROVIDER_SITE_OTHER): Payer: Medicare Other | Admitting: *Deleted

## 2011-03-07 ENCOUNTER — Other Ambulatory Visit: Payer: Self-pay | Admitting: Cardiovascular Disease

## 2011-03-07 DIAGNOSIS — M810 Age-related osteoporosis without current pathological fracture: Secondary | ICD-10-CM

## 2011-03-07 DIAGNOSIS — E039 Hypothyroidism, unspecified: Secondary | ICD-10-CM

## 2011-03-07 LAB — VITAMIN D 25 HYDROXY (VIT D DEFICIENCY, FRACTURES): Vit D, 25-Hydroxy: 39 ng/mL (ref 30–89)

## 2011-03-21 ENCOUNTER — Telehealth: Payer: Self-pay | Admitting: Cardiovascular Disease

## 2011-03-21 NOTE — Telephone Encounter (Signed)
Test results

## 2011-03-21 NOTE — Telephone Encounter (Signed)
I spoke with the pt and made her aware of TSH and Vitamin D results (pt called and requested that these labs be added to her lipid and liver order).  The pt also had an order in the system for a LIPID and LIVER which was not drawn.  The pt is upset because she feels there is a break down in communication with our office and things continue to be done incorrectly.  The pt requested a copy of her labs and I have mailed these to her home.  I did make the pt aware that technically if Dr Duanne Guess was ordering these labs then our office needed a written order to have them drawn under Dr Earmon Phoenix name.  The pt said she was not aware of this information.  The pt said that at this point she may consider finding a different Cardiology group for follow-up.  I will forward this information to Dr Excell Seltzer.

## 2011-04-01 ENCOUNTER — Telehealth: Payer: Self-pay | Admitting: Cardiovascular Disease

## 2011-04-01 DIAGNOSIS — E785 Hyperlipidemia, unspecified: Secondary | ICD-10-CM

## 2011-04-01 NOTE — Telephone Encounter (Signed)
Pt calling wanting to discuss results of blood test, to see if treatment needs to be done

## 2011-04-01 NOTE — Telephone Encounter (Signed)
Pt would like for Dr. Excell Seltzer to review Lipid profile and make recommendation regarding medication. Dr. Chauncy Passy office was called to fax lab results at [phone].  Pt is aware that Dr. Excell Seltzer will be in the office early next week.

## 2011-04-07 MED ORDER — EZETIMIBE 10 MG PO TABS: 10.0000 mg | ORAL_TABLET | Freq: Every day | ORAL | Status: DC

## 2011-04-07 NOTE — Telephone Encounter (Signed)
Returning call back to nurse.  

## 2011-04-07 NOTE — Telephone Encounter (Signed)
Per Dr. Excell Seltzer, patient would like to start Zetia 10 mg daily. I have called this into her pharmacy since we are out of samples. She is still somewhat upset with the care she is receiving in our office. I apologized for any problems she has had.

## 2011-05-02 ENCOUNTER — Ambulatory Visit: Payer: Medicare Other | Admitting: Physician Assistant

## 2011-05-11 ENCOUNTER — Encounter: Payer: Self-pay | Admitting: Physician Assistant

## 2011-05-11 ENCOUNTER — Ambulatory Visit (INDEPENDENT_AMBULATORY_CARE_PROVIDER_SITE_OTHER): Payer: Medicare Other | Admitting: Physician Assistant

## 2011-05-11 VITALS — Ht 64.0 in | Wt 148.0 lb

## 2011-05-11 DIAGNOSIS — R0989 Other specified symptoms and signs involving the circulatory and respiratory systems: Secondary | ICD-10-CM

## 2011-05-11 NOTE — Progress Notes (Signed)
History of Present Illness: Primary Cardiologist: Dr. [name] is a 70 y.o. female with a h/o CAD, s/p NSTEMI in 2009.  Cardiac catheterization in 2/09: Distal left main 20%, proximal LAD 50-60%, 50% between the first and second diagonals, proximal OM1 70% (small vessel), OM2 95%-treated with POBA in the proximal RCA 20%, EF 60-65%.  Last Myoview 10/09: No scar or ischemia.  Other history includes hyperlipidemia, hypothyroidism, osteoporosis and eczema.  When last seen by Dr. Excell Seltzer 3/12, she complained of memory issues and her statin was discontinued.  She was started on Zetia 6/12.  The patient was under the impression she was seeing Dr. Excell Seltzer today.  She refused to have her BP taken or an ECG and told the CMA she would not see anyone except Dr. Excell Seltzer.  The CMA explained to the patient that Dr. Excell Seltzer was not in the office.  She was offered the opportunity to see me or to reschedule.  She chose to reschedule and will see Dr. Excell Seltzer at his next available time.  It should be noted, that I did not see this patient today. Tereso Newcomer, PA-C   Past Medical History  Diagnosis Date  . CAD (coronary artery disease)     unspecified site with NSTEMI 2009. Small OM treated with PCA  . Atopic eczema   . Osteoporosis   . Myocardial infarction   . Hyperlipidemia   . Hypothyroidism     Current Outpatient Prescriptions  Medication Sig Dispense Refill  . aspirin 81 MG tablet Take 81 mg by mouth daily.        . Calcium Carb-Cholecalciferol 600-400 MG-UNIT TABS Take 1 tablet by mouth 2 (two) times daily.        . clobetasol (TEMOVATE) 0.05 % ointment Apply topically 2 (two) times daily.        . Coenzyme Q10 400 MG CAPS Take 1 capsule by mouth daily.        Marland Kitchen desoximetasone (TOPICORT) 0.25 % cream Apply topically 2 (two) times daily as needed.        . diphenhydrAMINE (BENADRYL) 25 mg capsule Take 25 mg by mouth at bedtime.        Marland Kitchen ezetimibe (ZETIA) 10 MG tablet Take 1 tablet  (10 mg total) by mouth daily.  30 tablet  3  . levothyroxine (SYNTHROID, LEVOTHROID) 125 MCG tablet Take 125 mcg by mouth daily.        . Multiple Vitamins-Minerals (EYE VITAMINS) TABS Take 1 tablet by mouth daily.        . nitroGLYCERIN (NITROSTAT) 0.4 MG SL tablet Place 0.4 mg under the tongue every 5 (five) minutes as needed.        . Omega-3 Fatty Acids (FISH OIL PO) Take 1 tablet by mouth daily.          Allergies: Allergies  Allergen Reactions  . Cephalexin     REACTION: rash  . Ciprofloxacin   . Erythromycin   . Lanolin   . Latex   . Penicillins   . Propylene Glycol   . Shellfish Allergy   . Tetracycline   . Tetracyclines & Related     Vital Signs: Ht 5\' 4"  (1.626 m)  Wt 148 lb (67.132 kg)  BMI 25.40 kg/m2  PHYSICAL EXAM: Well nourished, well developed, in no acute distress HEENT: normal Neck: no JVD Cardiac:  normal S1, S2; RRR; no murmur Lungs:  clear to auscultation bilaterally, no wheezing, rhonchi or rales Abd: soft, nontender, no  hepatomegaly Ext: no edema Skin: warm and dry Neuro:  CNs 2-12 intact, no focal abnormalities noted  EKG:  ASSESSMENT AND PLAN:

## 2011-05-12 ENCOUNTER — Encounter: Payer: Self-pay | Admitting: Cardiovascular Disease

## 2011-05-12 ENCOUNTER — Ambulatory Visit (INDEPENDENT_AMBULATORY_CARE_PROVIDER_SITE_OTHER): Payer: Medicare Other | Admitting: Cardiovascular Disease

## 2011-05-12 DIAGNOSIS — E785 Hyperlipidemia, unspecified: Secondary | ICD-10-CM

## 2011-05-12 DIAGNOSIS — I10 Essential (primary) hypertension: Secondary | ICD-10-CM

## 2011-05-12 DIAGNOSIS — I2510 Atherosclerotic heart disease of native coronary artery without angina pectoris: Secondary | ICD-10-CM

## 2011-05-12 DIAGNOSIS — R079 Chest pain, unspecified: Secondary | ICD-10-CM

## 2011-05-12 NOTE — Patient Instructions (Signed)
Your physician wants you to follow-up in: 6 months with Dr. Excell Seltzer. You will receive a reminder letter in the mail two months in advance. If you don't receive a letter, please call our office to schedule the follow-up appointment.  Your physician has requested that you have a stress echocardiogram. For further information please visit [email or web address] Please follow instruction sheet as given.  Your physician recommends that you return for fasting lab work in 6 months--prior to appt. With Dr. Durward Fortes.4

## 2011-05-14 ENCOUNTER — Encounter: Payer: Self-pay | Admitting: Cardiovascular Disease

## 2011-05-14 DIAGNOSIS — I10 Essential (primary) hypertension: Secondary | ICD-10-CM | POA: Insufficient documentation

## 2011-05-14 NOTE — Assessment & Plan Note (Signed)
The patient is on appropriate medical therapy with low-dose aspirin, and ezetemide for lipid-lowering.  With exertional dyspnea and episodic chest pain, recommend a stress echocardiogram to rule out ischemia in this patient with known CAD.

## 2011-05-14 NOTE — Assessment & Plan Note (Signed)
The patient brings in home blood pressure readings and she has occasional systolic pressures greater than 140 and occasional diastolic pressures in the low 09W. Her average blood pressures are in the high normal range. She has not been sitting down to rest before checking blood pressures consistently have asked her to do this and call the office in a few weeks with those readings.

## 2011-05-14 NOTE — Assessment & Plan Note (Signed)
Will followup lipids and LFTs when she returns in 6 months.

## 2011-05-14 NOTE — Progress Notes (Signed)
HPI:  This is a 70 year old woman presented for followup evaluation. The patient has coronary artery disease and initially presented with a non-ST elevation infarction in 2009. She was found to have small vessel disease in her obtuse marginal branch was treated with balloon angioplasty at the time of her initial event. She's had preserved left ventricular function. The patient is statin intolerant. She has had some recurrent chest pain but no exertional chest pain or tightness. She does admit to exertional dyspnea and generally is feeling more tired than in the past. She has some pressure-like chest pains in the mid chest and left side. She has not taken sublingual nitroglycerin. The episodes of chest pain have been self-limited.  Outpatient Encounter Prescriptions as of 05/12/2011  Medication Sig Dispense Refill  . aspirin 81 MG tablet Take 81 mg by mouth daily.        . Calcium Carb-Cholecalciferol 600-400 MG-UNIT TABS Take 1 tablet by mouth 2 (two) times daily.        . clobetasol (TEMOVATE) 0.05 % ointment Apply topically 2 (two) times daily.        . Coenzyme Q10 400 MG CAPS Take 1 capsule by mouth daily.        Marland Kitchen desoximetasone (TOPICORT) 0.25 % cream Apply topically 2 (two) times daily as needed.        . diphenhydrAMINE (BENADRYL) 25 mg capsule Take 25 mg by mouth at bedtime.        Marland Kitchen ezetimibe (ZETIA) 10 MG tablet Take 1 tablet (10 mg total) by mouth daily.  30 tablet  3  . levothyroxine (SYNTHROID, LEVOTHROID) 125 MCG tablet Take 125 mcg by mouth daily.        . Multiple Vitamins-Minerals (EYE VITAMINS) TABS Take 1 tablet by mouth daily.        . nitroGLYCERIN (NITROSTAT) 0.4 MG SL tablet Place 0.4 mg under the tongue every 5 (five) minutes as needed.        . Omega-3 Fatty Acids (FISH OIL PO) Take 1 tablet by mouth daily.          Allergies  Allergen Reactions  . Cephalexin     REACTION: rash  . Ciprofloxacin   . Erythromycin   . Lanolin   . Latex   . Penicillins   . Propylene  Glycol   . Shellfish Allergy   . Tetracycline   . Tetracyclines & Related     Past Medical History  Diagnosis Date  . CAD (coronary artery disease)     unspecified site with NSTEMI 2009. Small OM treated with PCA  . Atopic eczema   . Osteoporosis   . Myocardial infarction   . Hyperlipidemia   . Hypothyroidism     ROS: Negative except as per HPI  BP 182/104  Pulse 96  Ht 5\' 4"  (1.626 m)  Wt 147 lb (66.679 kg)  BMI 25.23 kg/m2  PHYSICAL EXAM: Pt is alert and oriented, NAD HEENT: normal Neck: JVP - normal, carotids 2+= without bruits Lungs: CTA bilaterally CV: RRR without murmur or gallop Abd: soft, NT, Positive BS, no hepatomegaly Ext: no C/C/E, distal pulses intact and equal Skin: warm/dry no rash  EKG:  Normal sinus rhythm, left anterior fascicular block, age indeterminate inferior infarct, heart rate 96 beats per minute   ASSESSMENT AND PLAN:

## 2011-05-17 ENCOUNTER — Telehealth: Payer: Self-pay | Admitting: *Deleted

## 2011-05-17 ENCOUNTER — Other Ambulatory Visit: Payer: Self-pay | Admitting: *Deleted

## 2011-05-17 DIAGNOSIS — I2510 Atherosclerotic heart disease of native coronary artery without angina pectoris: Secondary | ICD-10-CM

## 2011-05-17 NOTE — Telephone Encounter (Signed)
Reviewed with Victoria [name] in echo. When pt had carotid doppler last year vaginal cream brought from home by pt was used in place of ultrasound gel.  In order to due this probe was wrapped with plastic wrap. There is a question as to whether this could be done with echo but also question as to how this would affect results and machine.  No other gel in [facility] system is available that does not contain propylene glycol.

## 2011-05-17 NOTE — Telephone Encounter (Signed)
Spoke with pt regarding propylene glycol allergy. (pt has been scheduled for stress echo). Pt states she was treated for chronic dermatitis about 5 years ago and it was determined she was allergic to propylene glycol. After stopping this the dermatitis improved. Will review with Dr. Excell Seltzer. Pt also requesting copy of after visit summary from last office visit. Will mail this to her.

## 2011-05-17 NOTE — Telephone Encounter (Signed)
Reviewed with Dr. Excell Seltzer who would like pt to have regular treadmill test in place of stress echo. I called pt and gave her this information. She is agreeable with this plan. Will have schedulers contact pt to arrange.

## 2011-05-18 ENCOUNTER — Other Ambulatory Visit (HOSPITAL_COMMUNITY): Payer: Medicare Other | Admitting: Radiology

## 2011-05-19 ENCOUNTER — Other Ambulatory Visit (HOSPITAL_COMMUNITY): Payer: Medicare Other

## 2011-05-19 ENCOUNTER — Other Ambulatory Visit (HOSPITAL_COMMUNITY): Payer: Medicare Other | Admitting: Radiology

## 2011-06-10 ENCOUNTER — Ambulatory Visit (INDEPENDENT_AMBULATORY_CARE_PROVIDER_SITE_OTHER): Payer: Medicare Other | Admitting: Physician Assistant

## 2011-06-10 DIAGNOSIS — I2510 Atherosclerotic heart disease of native coronary artery without angina pectoris: Secondary | ICD-10-CM

## 2011-06-10 NOTE — Progress Notes (Signed)
Exercise Treadmill Test  Pre-Exercise Testing Evaluation Rhythm: normal sinus  Rate: 79   PR:  .15 QRS:  .09  QT:  .37 QTc: .42     Test  Exercise Tolerance Test Ordering MD: Tonny Bollman, MD  Interpreting MD:  Tereso Newcomer, PA-C  Unique Test No: 1  Treadmill:  1  Indication for ETT: chest pain - rule out ischemia  Contraindication to ETT: No   Stress Modality: exercise - treadmill  Cardiac Imaging Performed: non   Protocol: standard Bruce - maximal  Max BP:  167/83  Max MPHR (bpm):  150 85% MPR (bpm):  127  MPHR obtained (bpm):  130 % MPHR obtained:  87  Reached 85% MPHR (min:sec):  4:30 Total Exercise Time (min-sec):  4:57  Workload in METS:5.8 Borg Scale: 17  Reason ETT Terminated:  patient's desire to stop    ST Segment Analysis At Rest: non-specific ST segment slurring With Exercise: non-specific ST changes  Other Information Arrhythmia:  No Angina during ETT:  absent (0) Quality of ETT:  diagnostic  ETT Interpretation:  normal - no evidence of ischemia by ST analysis  Comments: Fair exercise tolerance. No chest pain.  Patient was short of breath.   Normal BP response to exercise. No ST-T changes to suggest ischemia.   Recommendations: Follow up with Dr. Excell Seltzer as directed.

## 2011-06-16 ENCOUNTER — Telehealth: Payer: Self-pay | Admitting: Cardiovascular Disease

## 2011-06-16 NOTE — Telephone Encounter (Signed)
Pt given results of stress test. Will mail copy to her per her request.

## 2011-06-16 NOTE — Telephone Encounter (Signed)
Test result

## 2011-06-24 LAB — CARDIAC PANEL(CRET KIN+CKTOT+MB+TROPI)
CK, MB: 128.2 — ABNORMAL HIGH
CK, MB: 97.4 — ABNORMAL HIGH
Relative Index: 6 — ABNORMAL HIGH
Total CK: 1415 — ABNORMAL HIGH
Total CK: 985 — ABNORMAL HIGH
Troponin I: 18.13
Troponin I: 26.33

## 2011-06-24 LAB — TYPE AND SCREEN
ABO/RH(D): A POS
Antibody Screen: NEGATIVE

## 2011-06-24 LAB — BASIC METABOLIC PANEL
BUN: 12
Chloride: 109
GFR calc Af Amer: >60
GFR calc non Af Amer: >60
Potassium: 3.4 — ABNORMAL LOW

## 2011-06-24 LAB — DIFFERENTIAL
Basophils Absolute: 0
Eosinophils Relative: 0
Lymphocytes Relative: 18
Lymphs Abs: 1.6
Monocytes Absolute: 0.1
Monocytes Relative: 1 — ABNORMAL LOW
Neutro Abs: 7.6

## 2011-06-24 LAB — T3 UPTAKE: T3 Uptake Ratio: 43.8 — ABNORMAL HIGH

## 2011-06-24 LAB — CBC
HCT: 30 — ABNORMAL LOW
HCT: 30.1 — ABNORMAL LOW
HCT: 35.8 — ABNORMAL LOW
Hemoglobin: 12.2
MCHC: 34.6
Platelets: 218
Platelets: 228
Platelets: 228
RBC: 3.27 — ABNORMAL LOW
RBC: 3.73 — ABNORMAL LOW
RBC: 3.85 — ABNORMAL LOW
RDW: 12.6
RDW: 12.7
WBC: 13.2 — ABNORMAL HIGH
WBC: 13.6 — ABNORMAL HIGH
WBC: 9.4

## 2011-06-24 LAB — I-STAT 8, (EC8 V) (CONVERTED LAB)
BUN: 13
Bicarbonate: 27.1 — ABNORMAL HIGH
Glucose, Bld: 123 — ABNORMAL HIGH
pCO2, Ven: 51.8 — ABNORMAL HIGH

## 2011-06-24 LAB — POCT CARDIAC MARKERS: Troponin i, poc: <0.05

## 2011-06-24 LAB — LIPID PANEL
Cholesterol: 146
Cholesterol: 177
HDL: 50
HDL: 55
LDL Cholesterol: 114 — ABNORMAL HIGH
Total CHOL/HDL Ratio: 2.9
Total CHOL/HDL Ratio: 3.2
Triglycerides: 40
VLDL: 8

## 2011-06-24 LAB — HEMOGLOBIN A1C: Mean Plasma Glucose: 115

## 2011-06-24 LAB — D-DIMER, QUANTITATIVE: D-Dimer, Quant: <0.22

## 2011-06-24 LAB — APTT
aPTT: 141 — ABNORMAL HIGH
aPTT: >200

## 2011-06-24 LAB — T4, FREE: Free T4: 1.22

## 2011-06-24 LAB — ABO/RH: ABO/RH(D): A POS

## 2011-06-24 LAB — HEPARIN LEVEL (UNFRACTIONATED)
Heparin Unfractionated: 1.39 — ABNORMAL HIGH
Heparin Unfractionated: 1.7 — ABNORMAL HIGH

## 2011-07-11 ENCOUNTER — Other Ambulatory Visit: Payer: Self-pay | Admitting: Cardiovascular Disease

## 2011-07-11 DIAGNOSIS — I6529 Occlusion and stenosis of unspecified carotid artery: Secondary | ICD-10-CM

## 2011-07-12 ENCOUNTER — Encounter (INDEPENDENT_AMBULATORY_CARE_PROVIDER_SITE_OTHER): Payer: Medicare Other | Admitting: *Deleted

## 2011-07-12 DIAGNOSIS — I6529 Occlusion and stenosis of unspecified carotid artery: Secondary | ICD-10-CM

## 2011-07-28 ENCOUNTER — Telehealth: Payer: Self-pay | Admitting: Cardiovascular Disease

## 2011-07-28 NOTE — Telephone Encounter (Signed)
Copy of carotid mailed to pt's home per her request.

## 2011-07-28 NOTE — Telephone Encounter (Signed)
Pls mail results to pt, having trouble getting results on phone

## 2011-09-01 ENCOUNTER — Telehealth: Payer: Self-pay | Admitting: Cardiovascular Disease

## 2011-09-01 DIAGNOSIS — I2510 Atherosclerotic heart disease of native coronary artery without angina pectoris: Secondary | ICD-10-CM

## 2011-09-01 DIAGNOSIS — E039 Hypothyroidism, unspecified: Secondary | ICD-10-CM

## 2011-09-01 NOTE — Telephone Encounter (Signed)
Order placed for Vit D and TSH to be drawn in Feb 2013. These labs are normally drawn by the pt's PCP.  I made the pt aware that she needs to obtain an order from Dr Duanne Guess so that these labs can be faxed back to the appropriate MD. Pt agreed with plan.

## 2011-09-01 NOTE — Telephone Encounter (Signed)
New Msg: Pt calling wanting to know if thyroid and vitamin D levels can be checked when blood work is drawn. Please return pt call to discuss further.

## 2011-09-06 ENCOUNTER — Other Ambulatory Visit: Payer: Self-pay

## 2011-09-06 DIAGNOSIS — I2510 Atherosclerotic heart disease of native coronary artery without angina pectoris: Secondary | ICD-10-CM

## 2011-09-21 ENCOUNTER — Telehealth: Payer: Self-pay | Admitting: Cardiovascular Disease

## 2011-09-21 NOTE — Telephone Encounter (Signed)
Walk in Pt Form " Pt Dropped Off Application for handicapped Card" sent to Lauren  09/21/11/km

## 2011-10-20 DIAGNOSIS — L821 Other seborrheic keratosis: Secondary | ICD-10-CM | POA: Diagnosis not present

## 2011-11-04 ENCOUNTER — Encounter (INDEPENDENT_AMBULATORY_CARE_PROVIDER_SITE_OTHER): Payer: Medicare Other | Admitting: *Deleted

## 2011-11-04 DIAGNOSIS — E785 Hyperlipidemia, unspecified: Secondary | ICD-10-CM

## 2011-11-04 DIAGNOSIS — I2510 Atherosclerotic heart disease of native coronary artery without angina pectoris: Secondary | ICD-10-CM

## 2011-11-04 LAB — CBC WITH DIFFERENTIAL/PLATELET
Basophils Absolute: 0 10*3/uL (ref 0.0–0.1)
Basophils Relative: 0.5 % (ref 0.0–3.0)
Eosinophils Absolute: 0.4 10*3/uL (ref 0.0–0.7)
Eosinophils Relative: 7.7 % — ABNORMAL HIGH (ref 0.0–5.0)
HCT: 39.4 % (ref 36.0–46.0)
Hemoglobin: 13.6 g/dL (ref 12.0–15.0)
Lymphocytes Relative: 48.3 % — ABNORMAL HIGH (ref 12.0–46.0)
Lymphs Abs: 2.6 10*3/uL (ref 0.7–4.0)
MCHC: 34.6 g/dL (ref 30.0–36.0)
MCV: 94.1 fl (ref 78.0–100.0)
Monocytes Absolute: 0.3 10*3/uL (ref 0.1–1.0)
Monocytes Relative: 6.4 % (ref 3.0–12.0)
Neutro Abs: 2 10*3/uL (ref 1.4–7.7)
Neutrophils Relative %: 37.1 % — ABNORMAL LOW (ref 43.0–77.0)
Platelets: 240 10*3/uL (ref 150.0–400.0)
RBC: 4.19 Mil/uL (ref 3.87–5.11)
RDW: 12.7 % (ref 11.5–14.6)
WBC: 5.4 10*3/uL (ref 4.5–10.5)

## 2011-11-04 LAB — LIPID PANEL
Cholesterol: 192 mg/dL (ref 0–200)
LDL Cholesterol: 126 mg/dL — ABNORMAL HIGH (ref 0–99)
Total CHOL/HDL Ratio: 4

## 2011-11-04 LAB — HEPATIC FUNCTION PANEL
AST: 17 U/L (ref 0–37)
Alkaline Phosphatase: 64 U/L (ref 39–117)
Bilirubin, Direct: 0 mg/dL (ref 0.0–0.3)

## 2011-11-04 LAB — BASIC METABOLIC PANEL
BUN: 14 mg/dL (ref 6–23)
CO2: 27 mEq/L (ref 19–32)
Calcium: 10 mg/dL (ref 8.4–10.5)
Chloride: 106 mEq/L (ref 96–112)
Creatinine, Ser: 0.8 mg/dL (ref 0.4–1.2)
GFR: 78.64 mL/min (ref >60.00)
Glucose, Bld: 84 mg/dL (ref 70–99)
Potassium: 3.8 mEq/L (ref 3.5–5.1)
Sodium: 139 mEq/L (ref 135–145)

## 2011-11-04 LAB — TSH: TSH: 0.09 u[IU]/mL — ABNORMAL LOW (ref 0.35–5.50)

## 2011-11-04 NOTE — Progress Notes (Signed)
This encounter was created in error - please disregard.

## 2011-11-08 ENCOUNTER — Encounter: Payer: Self-pay | Admitting: Cardiovascular Disease

## 2011-11-08 ENCOUNTER — Ambulatory Visit (INDEPENDENT_AMBULATORY_CARE_PROVIDER_SITE_OTHER): Payer: Medicare Other | Admitting: Cardiovascular Disease

## 2011-11-08 DIAGNOSIS — I10 Essential (primary) hypertension: Secondary | ICD-10-CM | POA: Diagnosis not present

## 2011-11-08 DIAGNOSIS — I2510 Atherosclerotic heart disease of native coronary artery without angina pectoris: Secondary | ICD-10-CM | POA: Diagnosis not present

## 2011-11-08 DIAGNOSIS — E785 Hyperlipidemia, unspecified: Secondary | ICD-10-CM | POA: Diagnosis not present

## 2011-11-08 NOTE — Assessment & Plan Note (Signed)
The patient has chest pain with typical and atypical features. The only thing that points toward potential coronary disease as the responsiveness to aspirin. Otherwise her symptoms are atypical as they are unrelated to physical activity. The pattern has not progressed over the past few years. The patient's normal stress testing surfaces reassurance. Despite that, we talked about the potential of cardiac catheterization for definitive evaluation since she is experiencing chest pain at least 3 times per week. She was not inclined to proceed with this evaluation and we will stick with medical therapy as we are currently doing. I've asked her to followup with Dr. Duanne Guess for adjustment of her Synthroid. I will see her back in 6 months for followup. I asked her to contact me if her chest pain pattern changes.

## 2011-11-08 NOTE — Progress Notes (Signed)
HPI:  Victoria [name] is a very nice 71 year old woman presenting for follow-up evaluation. The patient has coronary artery disease and presented with non-ST elevation infarction in 2009. At that time she was noted to have small vessel coronary disease and had total occlusion of a small circumflex branch. This was treated with balloon angioplasty alone. She has had preserved LV function.    The patient complains of episodic back pain and chest pain that occurs only at rest. She has not had chest pain with exertion. She takes an aspirin and the pain goes away. She does not appreciate any change in his pattern over the past few years. She has undergone stress testing that has been reassuring and has not documented any inducible ischemia. She feels much better off of statin therapy and feels that a "fog has been lifted." She denies lightheadedness, syncope, edema, orthopnea, or PND. She has had rare headaches.  The patient recently had lab work done. This was reviewed today and pertinent findings included a low TSH of 0.09, a cholesterol of 192, triglycerides of 107, HDL of 45, and LDL of 126. Her liver function tests were normal.  Outpatient Encounter Prescriptions as of 11/08/2011  Medication Sig Dispense Refill  . aspirin 81 MG tablet Take 81 mg by mouth daily.        . Calcium Carb-Cholecalciferol 600-400 MG-UNIT TABS Take 1 tablet by mouth 2 (two) times daily.        . clobetasol (TEMOVATE) 0.05 % ointment Apply topically 2 (two) times daily.        . Coenzyme Q10 400 MG CAPS Take 1 capsule by mouth daily.        Marland Kitchen desoximetasone (TOPICORT) 0.25 % cream Apply topically 2 (two) times daily as needed.        . diphenhydrAMINE (BENADRYL) 25 mg capsule Take 25 mg by mouth at bedtime.        Marland Kitchen ezetimibe (ZETIA) 10 MG tablet Take 1 tablet (10 mg total) by mouth daily.  30 tablet  3  . levothyroxine (SYNTHROID, LEVOTHROID) 125 MCG tablet Take 125 mcg by mouth daily.        . Multiple Vitamins-Minerals (EYE  VITAMINS) TABS Take 1 tablet by mouth daily.        . nitroGLYCERIN (NITROSTAT) 0.4 MG SL tablet Place 0.4 mg under the tongue every 5 (five) minutes as needed.        . Omega-3 Fatty Acids (FISH OIL PO) Take 1 tablet by mouth daily.          Allergies  Allergen Reactions  . Cephalexin     REACTION: rash  . Ciprofloxacin   . Erythromycin   . Lanolin   . Latex   . Penicillins   . Propylene Glycol   . Shellfish Allergy   . Tetracycline   . Tetracyclines & Related     Past Medical History  Diagnosis Date  . CAD (coronary artery disease)     unspecified site with NSTEMI 2009. Small OM treated with PCA  . Atopic eczema   . Osteoporosis   . Myocardial infarction   . Hyperlipidemia   . Hypothyroidism     ROS: Negative except as per HPI  BP 147/87  Pulse 91  Ht 5\' 4"  (1.626 m)  Wt 64.411 kg (142 lb)  BMI 24.37 kg/m2  PHYSICAL EXAM: Pt is alert and oriented, NAD HEENT: normal Neck: JVP - normal, carotids 2+= without bruits Lungs: CTA bilaterally CV: RRR without murmur or  gallop Abd: soft, NT, Positive BS, no hepatomegaly Ext: no C/C/E, distal pulses intact and equal Skin: warm/dry no rash  ASSESSMENT AND PLAN:

## 2011-11-08 NOTE — Assessment & Plan Note (Signed)
She is statin intolerance. Her lipids were reviewed with her in detail today. I have recommended continuing on her current medical program with Zetia and fish oil.

## 2011-11-08 NOTE — Patient Instructions (Signed)
Your physician wants you to follow-up in: 6 MONTHS.  You will receive a reminder letter in the mail two months in advance. If you don't receive a letter, please call our office to schedule the follow-up appointment.  Your physician recommends that you continue on your current medications as directed. Please refer to the Current Medication list given to you today.  Please follow-up with Dr Duanne Guess about your thyroid results.

## 2011-11-08 NOTE — Assessment & Plan Note (Signed)
The patient has white coat hypertension. Her blood pressure has been controlled at home.

## 2011-11-16 ENCOUNTER — Telehealth: Payer: Self-pay | Admitting: Cardiovascular Disease

## 2011-11-16 NOTE — Telephone Encounter (Signed)
New Msg: Pt calling wanting to speak with nurse regarding pt contact PCP to get blood results. Pt stated she is having difficulty getting blood results and would like to discuss this with nurse.

## 2011-11-16 NOTE — Telephone Encounter (Signed)
I spoke with the pt and she followed up with Dr Duanne Guess about her thyroid.  Dr Duanne Guess did not recommend any changes at this time and felt that the pt should have her labs rechecked in 3 months.  The pt said that this was not the response that she thought was appropriate at this time and the pt would like to establish with another PCP.  The pt did get a NEW Pt appointment with Dr Felicity Coyer in June.  The pt would like to see if Dr Excell Seltzer can get her an earlier appointment to discuss her TSH with Dr Felicity Coyer.

## 2011-11-17 NOTE — Telephone Encounter (Signed)
Victoria [name] - this is a patient of mine who I have referred to you. She has an appt with you in June but would like to be seen sooner if possible. If you have a waiting list when you have cancellations, could you put her on that? She has some thyroid issues but nothing urgent. thx a lot - Kathlene November (note from Dr Excell Seltzer to Dr Felicity Coyer)  Will await response from Dr Diamantina Monks office and see if pt's appointment gets moved up.

## 2011-11-25 ENCOUNTER — Telehealth: Payer: Self-pay | Admitting: Internal Medicine

## 2011-11-25 NOTE — Telephone Encounter (Signed)
Message copied by Etheleen Sia on Fri Nov 25, 2011 12:42 PM ------      Message from: Rene Paci A      Created: Wed Nov 16, 2011  6:20 PM      Regarding: RE: NEW PT       You can work her in sometime in the next 3-4 weeks            Thanks      ----- Message -----         From: Etheleen Sia         Sent: 11/16/2011   4:53 PM           To: Rene Paci, MD      Subject: NEW PT                                                   Victoria [name] WOULD LIKE TO EST AS A NEW PT PRIOR TO June.  SHE IS HAVING SOME THYROID PROBLEMS PER DR COOPER.

## 2011-11-30 ENCOUNTER — Encounter: Payer: Self-pay | Admitting: Internal Medicine

## 2011-11-30 ENCOUNTER — Ambulatory Visit (INDEPENDENT_AMBULATORY_CARE_PROVIDER_SITE_OTHER): Payer: Medicare Other | Admitting: Internal Medicine

## 2011-11-30 DIAGNOSIS — E785 Hyperlipidemia, unspecified: Secondary | ICD-10-CM | POA: Diagnosis not present

## 2011-11-30 DIAGNOSIS — M810 Age-related osteoporosis without current pathological fracture: Secondary | ICD-10-CM

## 2011-11-30 DIAGNOSIS — E039 Hypothyroidism, unspecified: Secondary | ICD-10-CM | POA: Diagnosis not present

## 2011-11-30 DIAGNOSIS — I10 Essential (primary) hypertension: Secondary | ICD-10-CM | POA: Diagnosis not present

## 2011-11-30 MED ORDER — LEVOTHYROXINE SODIUM 112 MCG PO TABS: 112.0000 ug | ORAL_TABLET | Freq: Every day | ORAL | Status: DC

## 2011-11-30 NOTE — Patient Instructions (Signed)
It was good to see you today. We have reviewed your prior records including labs and tests today Normal vitamin D levels in June 2012 - goal vitamin D 1000 2000 units daily, calcium 1200 mg daily Decrease Synthroid dose to 112 mcg daily - Your prescription(s) have been submitted to your pharmacy. Please take as directed and contact our office if you believe you are having problem(s) with the medication(s). Will schedule for followup bone density - you'll be contacted with results after review We will consider treatment with Prolia if needed for osteoporosis Please schedule followup in 3 months, call sooner if problems.

## 2011-11-30 NOTE — Progress Notes (Signed)
Subjective:    Patient ID: Victoria [name], female    DOB: [birth year], 71 y.o.   MRN: [id number]  HPI New to me, but known to our group - transferring from Dr Duanne Guess   Reviewed chronic medical issues:  Osteoporosis - history of coccyx  Fracture, on bisphosphonate for greater than 10 years, stopped in 2011 due to concern for possible brittle bone side effects - involves weightbearing exercise and takes calcium plus vitamin D regularly - last DEXA 2010 reviewed, ? Other options for osteoporosis treatment if needed  Hypothyroid - recent labs show suppressed TSH - no recent dose changes in Synthroid replacement - denies weight changes, fatigue bowel changes or skin/hair changes  Dyslipidemia. Intolerant of statins. Takes zetia and fish oil for same. Follows annually with cardiology and follows a low-fat diet with regular exercise as tolerated  Whitecoat hypertension - never on prescription medications for same  Past Medical History  Diagnosis Date  . CAD (coronary artery disease)     unspecified site with NSTEMI 2009. Small OM treated with PCA  . Atopic eczema   . Osteoporosis   . Hyperlipidemia     intol statin  . Hypothyroidism   . Essential hypertension, benign     white coat   Family History  Problem Relation Age of Onset  . Colon cancer Mother 45    1st degree  . Stroke Father 22  . Stroke      Grandfather- grandmother   History  Substance Use Topics  . Smoking status: Never Smoker   . Smokeless tobacco: Not on file  . Alcohol Use: 0.0 oz/week     1 glass of wine daily    Review of Systems  Constitutional: Negative for fever and fatigue.  Respiratory: Negative for cough and shortness of breath.   Cardiovascular: Negative for chest pain and leg swelling.       Objective:   Physical Exam BP 132/80  Pulse 82  Temp(Src) 97.7 F (36.5 C) (Oral)  Ht 5\' 4"  (1.626 m)  Wt 147 lb 6.4 oz (66.86 kg)  BMI 25.30 kg/m2  SpO2 97% Wt Readings from Last 3 Encounters:    11/30/11 147 lb 6.4 oz (66.86 kg)  11/08/11 142 lb (64.411 kg)  05/12/11 147 lb (66.679 kg)   Constitutional: She appears well-developed and well-nourished. No distress.  Neck: Normal range of motion. Neck supple. No JVD present. No thyromegaly present.  Cardiovascular: Normal rate, regular rhythm and normal heart sounds.  No murmur heard. No BLE edema. Pulmonary/Chest: Effort normal and breath sounds normal. No respiratory distress. She has no wheezes.  Neurological: She is alert and oriented to person, place, and time. No cranial nerve deficit. Coordination normal.  Psychiatric: She has a normal mood and affect. Her behavior is normal. Judgment and thought content normal.   Lab Results  Component Value Date   WBC 5.4 11/04/2011   HGB 13.6 11/04/2011   HCT 39.4 11/04/2011   PLT 240.0 11/04/2011   GLUCOSE 84 11/04/2011   CHOL 192 11/04/2011   TRIG 107.0 11/04/2011   HDL 44.60 11/04/2011   LDLCALC 126* 11/04/2011   ALT 17 11/04/2011   AST 17 11/04/2011   NA 139 11/04/2011   K 3.8 11/04/2011   CL 106 11/04/2011   CREATININE 0.8 11/04/2011   BUN 14 11/04/2011   CO2 27 11/04/2011   TSH 0.09* 11/04/2011   INR 1.0 11/11/2007   HGBA1C  Value: 5.4 (NOTE)   The ADA recommends the following therapeutic  goals for glycemic   control related to Hgb A1C measurement:   Goal of Therapy:   < 7.0% Hgb A1C   Action Suggested:  > 8.0% Hgb A1C   Ref:  Diabetes Care, 22, Suppl. 1, 1999 11/10/2007       Assessment & Plan:  See problem list. Medications and labs reviewed today.

## 2011-11-30 NOTE — Assessment & Plan Note (Signed)
Lab Results  Component Value Date   TSH 0.09* 11/04/2011   Will slightly reduce synthroid now - 125>>112 - daily - new erx done Recheck 12 weeks

## 2011-11-30 NOTE — Assessment & Plan Note (Signed)
Last DEXA reviewed 04/2009: -2.8 L fem - also hx coccyx fx On fosamax for >39yr - took drug holiday and stopped med summer 2011 due to concern for brittle bone ?other options - recheck DEXA now, consider Prolia Reviewed need for adequate Vit D and Ca intake

## 2011-12-01 ENCOUNTER — Encounter: Payer: Self-pay | Admitting: Internal Medicine

## 2011-12-01 NOTE — Assessment & Plan Note (Signed)
Intolerant of all statins, follows with cardiology for same and checks lipids annually Recommended to continue Zetia treatment with omega-3, low-fat diet and aerobic exercise program to control weight

## 2011-12-01 NOTE — Assessment & Plan Note (Signed)
BP Readings from Last 3 Encounters:  11/30/11 132/80  11/08/11 147/87  05/12/11 182/104   Patient describes severe episodes of whitecoat hypertension, home log reviewed and always normal No medication recommended

## 2011-12-05 ENCOUNTER — Ambulatory Visit (INDEPENDENT_AMBULATORY_CARE_PROVIDER_SITE_OTHER)
Admission: RE | Admit: 2011-12-05 | Discharge: 2011-12-05 | Disposition: A | Discharge status: Home / Self Care | Payer: Medicare Other | Source: Ambulatory Visit

## 2011-12-05 DIAGNOSIS — M810 Age-related osteoporosis without current pathological fracture: Secondary | ICD-10-CM | POA: Diagnosis not present

## 2012-01-02 ENCOUNTER — Encounter: Payer: Self-pay | Admitting: Internal Medicine

## 2012-01-02 ENCOUNTER — Other Ambulatory Visit: Payer: Self-pay | Admitting: Internal Medicine

## 2012-02-29 ENCOUNTER — Ambulatory Visit: Payer: Medicare Other | Admitting: Internal Medicine

## 2012-03-05 ENCOUNTER — Telehealth: Payer: Self-pay | Admitting: Internal Medicine

## 2012-03-05 NOTE — Telephone Encounter (Signed)
Called pt no answer LMOM due to insurance will have to see md first before any labs to be done. Pt just had labs done back in Feb lipid, tsh, cbc, bmet... 03/05/12@11 :05am/LMB

## 2012-03-05 NOTE — Telephone Encounter (Signed)
The pt has scheduled her 3 month follow up and is hoping to get blood work done too.  She is hoping for a call back to confirm she can go to the lab on Friday.  Thanks!

## 2012-03-12 ENCOUNTER — Encounter: Payer: Self-pay | Admitting: Internal Medicine

## 2012-03-12 ENCOUNTER — Ambulatory Visit (INDEPENDENT_AMBULATORY_CARE_PROVIDER_SITE_OTHER): Payer: Medicare Other | Admitting: Internal Medicine

## 2012-03-12 ENCOUNTER — Other Ambulatory Visit (INDEPENDENT_AMBULATORY_CARE_PROVIDER_SITE_OTHER): Payer: Medicare Other

## 2012-03-12 VITALS — BP 130/72 | HR 76 | Temp 97.2°F | Ht 63.5 in | Wt 146.0 lb

## 2012-03-12 DIAGNOSIS — R5381 Other malaise: Secondary | ICD-10-CM

## 2012-03-12 DIAGNOSIS — M810 Age-related osteoporosis without current pathological fracture: Secondary | ICD-10-CM

## 2012-03-12 DIAGNOSIS — R5383 Other fatigue: Secondary | ICD-10-CM

## 2012-03-12 DIAGNOSIS — E039 Hypothyroidism, unspecified: Secondary | ICD-10-CM | POA: Diagnosis not present

## 2012-03-12 DIAGNOSIS — S39012A Strain of muscle, fascia and tendon of lower back, initial encounter: Secondary | ICD-10-CM

## 2012-03-12 DIAGNOSIS — IMO0002 Reserved for concepts with insufficient information to code with codable children: Secondary | ICD-10-CM

## 2012-03-12 LAB — CBC WITH DIFFERENTIAL/PLATELET
Basophils Absolute: 0 10*3/uL (ref 0.0–0.1)
Eosinophils Absolute: 0.3 10*3/uL (ref 0.0–0.7)
Hemoglobin: 13.2 g/dL (ref 12.0–15.0)
Lymphocytes Relative: 42.9 % (ref 12.0–46.0)
MCHC: 33.7 g/dL (ref 30.0–36.0)
Neutro Abs: 3.5 10*3/uL (ref 1.4–7.7)
Platelets: 253 10*3/uL (ref 150.0–400.0)
RDW: 12.5 % (ref 11.5–14.6)

## 2012-03-12 LAB — TSH: TSH: 0.03 u[IU]/mL — ABNORMAL LOW (ref 0.35–5.50)

## 2012-03-12 MED ORDER — CLOBETASOL PROPIONATE 0.05 % EX SOLN: 1.0000 "application " | Freq: Two times a day (BID) | CUTANEOUS | Status: DC

## 2012-03-12 MED ORDER — LEVOTHYROXINE SODIUM 100 MCG PO TABS: 100.0000 ug | ORAL_TABLET | Freq: Every day | ORAL | Status: DC

## 2012-03-12 MED ORDER — LEVOTHYROXINE SODIUM 112 MCG PO TABS: 112.0000 ug | ORAL_TABLET | Freq: Every day | ORAL | Status: DC

## 2012-03-12 NOTE — Assessment & Plan Note (Signed)
Last DEXA reviewed 04/2009: -2.8 L fem - also hx coccyx fx On fosamax for >10yr - took drug holiday and stopped med summer 2011 due to concern for brittle bone recheck DEXA 11/2011 - no significant change declines Prolia - reconsider fosamax if future decline Reviewed need for adequate Vit D and Ca intake - check vit D now 

## 2012-03-12 NOTE — Patient Instructions (Signed)
It was good to see you today. We have reviewed your prior records including labs and tests today Test(s) ordered today. Your results will be called to you after review (48-72hours after test completion). If any changes need to be made, you will be notified at that time. Please schedule followup in 6 months for review, call sooner if problems.

## 2012-03-12 NOTE — Progress Notes (Signed)
Addended by: Rene Paci A on: 03/12/2012 05:56 PM   Modules accepted: Orders

## 2012-03-12 NOTE — Assessment & Plan Note (Signed)
Lab Results  Component Value Date   TSH 0.09* 11/04/2011  slightly reduce synthroid now - 125>>112 - daily on 11/2011 Recheck now

## 2012-03-12 NOTE — Progress Notes (Signed)
Subjective:    Patient ID: Victoria [name], female    DOB: [birth year], 71 y.o.   MRN: [id number]  HPI  Here for follow up - reviewed chronic medical issues:  Osteoporosis - history of coccyx fracture, on bisphosphonate for greater than 10 years, stopped in 2011 due to concern for possible brittle bone side effects - involves weightbearing exercise and takes calcium plus vitamin D regularly - last DEXA 2010 and 2012 reviewed, -not interested in fosamax  Hypothyroid - recent labs reviewed - no recent dose changes in Synthroid replacement - denies weight changes, fatigue bowel changes or skin/hair changes  Dyslipidemia. Intolerant of statins. Takes zetia and fish oil for same. Follows annually with cardiology and follows a low-fat diet with regular exercise as tolerated  Whitecoat hypertension - never on prescription medications for same  Past Medical History  Diagnosis Date  . CAD (coronary artery disease)     unspecified site with NSTEMI 2009. Small OM treated with PCA  . Atopic eczema   . Osteoporosis     hx tailbone fx; bisphos >78yr tx, stopped 2011  . Hyperlipidemia     intol statin  . Hypothyroidism   . Essential hypertension, benign     white coat    Review of Systems  Constitutional: Positive for fatigue. Negative for fever and unexpected weight change.  Respiratory: Negative for cough and shortness of breath.   Cardiovascular: Negative for chest pain and leg swelling.  Musculoskeletal: Positive for back pain (since cough and summer cold 2 weeks ago).       Objective:   Physical Exam  BP 130/72  Pulse 76  Temp(Src) 97.2 F (36.2 C) (Oral)  Ht 5' 3.5" (1.613 m)  Wt 146 lb (66.225 kg)  BMI 25.46 kg/m2  SpO2 97% Wt Readings from Last 3 Encounters:  03/12/12 146 lb (66.225 kg)  11/30/11 147 lb 6.4 oz (66.86 kg)  11/08/11 142 lb (64.411 kg)   Constitutional: She appears well-developed and well-nourished. No distress.  Neck: Normal range of motion. Neck  supple. No JVD present. No thyromegaly present.  Cardiovascular: Normal rate, regular rhythm and normal heart sounds.  No murmur heard. No BLE edema. Pulmonary/Chest: Effort normal and breath sounds normal. No respiratory distress. She has no wheezes.  MSkel: Back: full range of motion of thoracic and lumbar spine. Non tender to palpation. Negative straight leg raise. DTR's are symmetrically intact. Sensation intact in all dermatomes of the lower extremities. Full strength to manual muscle testing. patient is able to heel toe walk without difficulty and ambulates with antalgic gait. Neurological: She is alert and oriented to person, place, and time. No cranial nerve deficit. Coordination normal.  Psychiatric: She has a normal mood and affect. Her behavior is normal. Judgment and thought content normal.   Lab Results  Component Value Date   WBC 5.4 11/04/2011   HGB 13.6 11/04/2011   HCT 39.4 11/04/2011   PLT 240.0 11/04/2011   GLUCOSE 84 11/04/2011   CHOL 192 11/04/2011   TRIG 107.0 11/04/2011   HDL 44.60 11/04/2011   LDLCALC 126* 11/04/2011   ALT 17 11/04/2011   AST 17 11/04/2011   NA 139 11/04/2011   K 3.8 11/04/2011   CL 106 11/04/2011   CREATININE 0.8 11/04/2011   BUN 14 11/04/2011   CO2 27 11/04/2011   TSH 0.09* 11/04/2011   INR 1.0 11/11/2007   HGBA1C  Value: 5.4 (NOTE)   The ADA recommends the following therapeutic goals for glycemic   control  related to Hgb A1C measurement:   Goal of Therapy:   < 7.0% Hgb A1C   Action Suggested:  > 8.0% Hgb A1C   Ref:  Diabetes Care, 22, Suppl. 1, 1999 11/10/2007       Assessment & Plan:  See problem list. Medications and labs reviewed today.  Fatigue - nonspecific - check CBC now at pt request  Back strin - OTC meds prn - call if worse or unimproved

## 2012-03-28 ENCOUNTER — Ambulatory Visit: Payer: Medicare Other | Admitting: Internal Medicine

## 2012-04-02 DIAGNOSIS — M5416 Radiculopathy, lumbar region: Secondary | ICD-10-CM

## 2012-04-02 HISTORY — DX: Radiculopathy, lumbar region: M54.16

## 2012-04-06 ENCOUNTER — Emergency Department (HOSPITAL_COMMUNITY)
Admission: EM | Admit: 2012-04-06 | Discharge: 2012-04-07 | Disposition: A | Discharge status: Home / Self Care | Payer: Medicare Other | Attending: Emergency Medicine | Admitting: Emergency Medicine

## 2012-04-06 DIAGNOSIS — R209 Unspecified disturbances of skin sensation: Secondary | ICD-10-CM | POA: Insufficient documentation

## 2012-04-06 DIAGNOSIS — M79609 Pain in unspecified limb: Secondary | ICD-10-CM | POA: Insufficient documentation

## 2012-04-06 DIAGNOSIS — I10 Essential (primary) hypertension: Secondary | ICD-10-CM | POA: Insufficient documentation

## 2012-04-06 DIAGNOSIS — M545 Low back pain, unspecified: Secondary | ICD-10-CM | POA: Insufficient documentation

## 2012-04-06 DIAGNOSIS — M549 Dorsalgia, unspecified: Secondary | ICD-10-CM | POA: Diagnosis not present

## 2012-04-06 DIAGNOSIS — E039 Hypothyroidism, unspecified: Secondary | ICD-10-CM | POA: Insufficient documentation

## 2012-04-06 DIAGNOSIS — I2510 Atherosclerotic heart disease of native coronary artery without angina pectoris: Secondary | ICD-10-CM | POA: Insufficient documentation

## 2012-04-06 DIAGNOSIS — I700 Atherosclerosis of aorta: Secondary | ICD-10-CM | POA: Diagnosis not present

## 2012-04-06 DIAGNOSIS — Z79899 Other long term (current) drug therapy: Secondary | ICD-10-CM | POA: Insufficient documentation

## 2012-04-06 DIAGNOSIS — E785 Hyperlipidemia, unspecified: Secondary | ICD-10-CM | POA: Insufficient documentation

## 2012-04-06 DIAGNOSIS — M810 Age-related osteoporosis without current pathological fracture: Secondary | ICD-10-CM | POA: Insufficient documentation

## 2012-04-07 ENCOUNTER — Emergency Department (HOSPITAL_COMMUNITY): Payer: Medicare Other

## 2012-04-07 ENCOUNTER — Encounter (HOSPITAL_COMMUNITY): Payer: Self-pay | Admitting: *Deleted

## 2012-04-07 DIAGNOSIS — E785 Hyperlipidemia, unspecified: Secondary | ICD-10-CM | POA: Diagnosis not present

## 2012-04-07 DIAGNOSIS — R209 Unspecified disturbances of skin sensation: Secondary | ICD-10-CM | POA: Diagnosis not present

## 2012-04-07 DIAGNOSIS — Z79899 Other long term (current) drug therapy: Secondary | ICD-10-CM | POA: Diagnosis not present

## 2012-04-07 DIAGNOSIS — I10 Essential (primary) hypertension: Secondary | ICD-10-CM | POA: Diagnosis not present

## 2012-04-07 DIAGNOSIS — M79609 Pain in unspecified limb: Secondary | ICD-10-CM | POA: Diagnosis not present

## 2012-04-07 DIAGNOSIS — I700 Atherosclerosis of aorta: Secondary | ICD-10-CM | POA: Diagnosis not present

## 2012-04-07 DIAGNOSIS — M549 Dorsalgia, unspecified: Secondary | ICD-10-CM | POA: Diagnosis not present

## 2012-04-07 DIAGNOSIS — I2510 Atherosclerotic heart disease of native coronary artery without angina pectoris: Secondary | ICD-10-CM | POA: Diagnosis not present

## 2012-04-07 DIAGNOSIS — M810 Age-related osteoporosis without current pathological fracture: Secondary | ICD-10-CM | POA: Diagnosis not present

## 2012-04-07 DIAGNOSIS — M545 Low back pain, unspecified: Secondary | ICD-10-CM | POA: Diagnosis not present

## 2012-04-07 DIAGNOSIS — E039 Hypothyroidism, unspecified: Secondary | ICD-10-CM | POA: Diagnosis not present

## 2012-04-07 LAB — PROTIME-INR
INR: 0.89 (ref 0.00–1.49)
Prothrombin Time: 12.2 seconds (ref 11.6–15.2)

## 2012-04-07 LAB — BASIC METABOLIC PANEL
GFR calc Af Amer: >90 mL/min (ref >90)
GFR calc non Af Amer: 84 mL/min — ABNORMAL LOW (ref >90)
Glucose, Bld: 111 mg/dL — ABNORMAL HIGH (ref 70–99)
Potassium: 4 mEq/L (ref 3.5–5.1)
Sodium: 136 mEq/L (ref 135–145)

## 2012-04-07 LAB — URINALYSIS, ROUTINE W REFLEX MICROSCOPIC
Nitrite: NEGATIVE
Specific Gravity, Urine: 1.019 (ref 1.005–1.030)
Urobilinogen, UA: 0.2 mg/dL (ref 0.0–1.0)

## 2012-04-07 LAB — CBC
Hemoglobin: 13.7 g/dL (ref 12.0–15.0)
MCHC: 34.7 g/dL (ref 30.0–36.0)
RDW: 12.5 % (ref 11.5–15.5)

## 2012-04-07 MED ORDER — OXYCODONE-ACETAMINOPHEN 5-325 MG PO TABS: 2.0000 | ORAL_TABLET | Freq: Four times a day (QID) | ORAL | Status: DC | PRN

## 2012-04-07 MED ORDER — ONDANSETRON HCL 4 MG/2ML IJ SOLN
4.0000 mg | Freq: Once | INTRAMUSCULAR | Status: AC
Start: 2012-04-07 — End: 2012-04-07
  Administered 2012-04-07: 4 mg via INTRAVENOUS
  Filled 2012-04-07: qty 2

## 2012-04-07 MED ORDER — FENTANYL CITRATE 0.05 MG/ML IJ SOLN
50.0000 ug | Freq: Once | INTRAMUSCULAR | Status: AC
  Administered 2012-04-07: 50 ug via INTRAVENOUS
  Filled 2012-04-07: qty 2

## 2012-04-07 MED ORDER — CYCLOBENZAPRINE HCL 10 MG PO TABS: 10.0000 mg | ORAL_TABLET | Freq: Two times a day (BID) | ORAL | Status: DC | PRN

## 2012-04-07 MED ORDER — PREDNISONE 20 MG PO TABS: 60.0000 mg | ORAL_TABLET | Freq: Every day | ORAL | Status: DC

## 2012-04-07 NOTE — ED Notes (Signed)
Patient transported to CT 

## 2012-04-07 NOTE — ED Provider Notes (Signed)
History     CSN: [id number]  Arrival date & time 04/06/12  2319   First MD Initiated Contact with Patient 04/07/12 0209      Chief Complaint  Patient presents with  . Back Pain    (Consider location/radiation/quality/duration/timing/severity/associated sxs/prior treatment) HPI History provided by patient and her husband that side. Severe left-sided low back pain onset today around 3 PM radiates away down left leg to her foot. She has some left foot numbness but no weakness. Feels better to stand up. Worse with movement. Had some pain 2 days ago with bending down to pick up a pan. Also had some back pain over the last week but nothing this severe.  No fevers or chills. No incontinence. No saddle paresthesias. Took Advil at home with no relief and presents here for evaluation. No history of similar pain. No abdominal pain. No flank pain. No hematuria. No rash. No trauma otherwise. Past Medical History  Diagnosis Date  . CAD (coronary artery disease)     unspecified site with NSTEMI 2009. Small OM treated with PCA  . Atopic eczema   . Osteoporosis     hx tailbone fx; bisphos >26yr tx, stopped 2011  . Hyperlipidemia     intol statin  . Hypothyroidism   . Essential hypertension, benign     white coat    Past Surgical History  Procedure Date  . Appendectomy   . Tonsillectomy   . Carpal tunnel release     bilateral, dorsal release  . Hip surgery     in the 1980s with a staph infection postoperatively  . Laparoscopic hysterectomy     Family History  Problem Relation Age of Onset  . Colon cancer Mother 68    1st degree  . Stroke Father 70  . Stroke      Grandfather- grandmother    History  Substance Use Topics  . Smoking status: Never Smoker   . Smokeless tobacco: Not on file  . Alcohol Use: 0.0 oz/week     1 glass of wine daily    OB History    Grav Para Term Preterm Abortions TAB SAB Ect Mult Living                  Review of Systems  Constitutional: Negative  for fever and chills.  HENT: Negative for neck pain and neck stiffness.   Eyes: Negative for pain.  Respiratory: Negative for shortness of breath.   Cardiovascular: Negative for chest pain.  Gastrointestinal: Negative for abdominal pain.  Genitourinary: Negative for dysuria.  Musculoskeletal: Positive for back pain.  Skin: Negative for rash.  Neurological: Negative for headaches.  All other systems reviewed and are negative.    Allergies  Cephalexin; Ciprofloxacin; Erythromycin; Lanolin; Latex; Penicillins; Propylene glycol; Shellfish allergy; Tetracycline; and Tetracyclines & related  Home Medications   Current Outpatient Rx  Name Route Sig Dispense Refill  . ASPIRIN 81 MG PO TABS Oral Take 81 mg by mouth daily.      Marland Kitchen CALCIUM CARB-CHOLECALCIFEROL 600-400 MG-UNIT PO TABS Oral Take 1 tablet by mouth 2 (two) times daily.      Marland Kitchen CLOBETASOL PROPIONATE 0.05 % EX SOLN Topical Apply 1 application topically 2 (two) times daily. 50 mL 0  . COENZYME Q10 400 MG PO CAPS Oral Take 1 capsule by mouth daily.      . DESOXIMETASONE 0.25 % EX CREA Topical Apply topically 2 (two) times daily as needed.      Marland Kitchen DIPHENHYDRAMINE HCL  25 MG PO CAPS Oral Take 25 mg by mouth at bedtime.      Marland Kitchen EZETIMIBE 10 MG PO TABS Oral Take 1 tablet (10 mg total) by mouth daily. 30 tablet 3  . LEVOTHYROXINE SODIUM 100 MCG PO TABS Oral Take 1 tablet (100 mcg total) by mouth daily. 90 tablet 1  . EYE VITAMINS PO TABS Oral Take 1 tablet by mouth daily.      Marland Kitchen NITROGLYCERIN 0.4 MG SL SUBL Sublingual Place 0.4 mg under the tongue every 5 (five) minutes as needed.        BP 173/88  Pulse 83  Temp 97.7 F (36.5 C) (Oral)  Resp 16  SpO2 98%  Physical Exam  Constitutional: She is oriented to person, place, and time. She appears well-developed and well-nourished.  HENT:  Head: Normocephalic and atraumatic.  Eyes: Conjunctivae and EOM are normal. Pupils are equal, round, and reactive to light.  Neck: Trachea normal. Neck  supple. No thyromegaly present.  Cardiovascular: Normal rate, regular rhythm, S1 normal, S2 normal and normal pulses.     No systolic murmur is present   No diastolic murmur is present  Pulses:      Radial pulses are 2+ on the right side, and 2+ on the left side.  Pulmonary/Chest: Effort normal and breath sounds normal. She has no wheezes. She has no rhonchi. She has no rales. She exhibits no tenderness.  Abdominal: Soft. Normal appearance and bowel sounds are normal. There is no tenderness. There is no CVA tenderness and negative Murphy's sign.  Musculoskeletal:       Localizes discomfort to left lower lumbar region without reproducible tenderness. There is no midline deformity the lumbar spine and no rash or lesion. No lower extremity deficits with sensation to light touch intact throughout equal DTRs and equal lower history strength. Normal gait intact.  Neurological: She is alert and oriented to person, place, and time. She has normal strength. No cranial nerve deficit or sensory deficit. GCS eye subscore is 4. GCS verbal subscore is 5. GCS motor subscore is 6.  Skin: Skin is warm and dry. No rash noted. She is not diaphoretic.  Psychiatric: Her speech is normal.       Cooperative and appropriate    ED Course  Procedures (including critical care time)  Labs Reviewed  BASIC METABOLIC PANEL - Abnormal; Notable for the following:    Glucose, Bld 111 (*)     Calcium 10.6 (*)     GFR calc non Af Amer 84 (*)     All other components within normal limits  URINALYSIS, ROUTINE W REFLEX MICROSCOPIC - Abnormal; Notable for the following:    APPearance TURBID (*)     Leukocytes, UA MODERATE (*)     All other components within normal limits  URINE MICROSCOPIC-ADD ON - Abnormal; Notable for the following:    Bacteria, UA FEW (*)     All other components within normal limits  CBC  PROTIME-INR   Ct Abdomen Pelvis Wo Contrast  04/07/2012  *RADIOLOGY REPORT*  Clinical Data: Back pain radiating  down legs.  CT ABDOMEN AND PELVIS WITHOUT CONTRAST  Technique:  Multidetector CT imaging of the abdomen and pelvis was performed following the standard protocol without intravenous contrast.  Comparison: None.  Findings: Minimal atelectasis is noted at the right lung base.  The liver and spleen are unremarkable in appearance.  The gallbladder is within normal limits.  The pancreas and adrenal glands are unremarkable.  A 1.4  cm hypodensity along the upper pole of the left kidney is thought to reflect a cyst, given its attenuation.  The kidneys are otherwise unremarkable in appearance.  There is no evidence of hydronephrosis.  No renal or ureteral stones are seen.  No perinephric stranding is appreciated.  No free fluid is identified.  The small bowel is unremarkable in appearance.  The stomach is within normal limits.  No acute vascular abnormalities are seen.  Calcification is noted along the abdominal aorta and its branches.  The patient is status post appendectomy.  The colon is partially filled with stool and is unremarkable in appearance.  The bladder is mildly distended and grossly unremarkable in appearance.  The patient is status post hysterectomy; no suspicious adnexal masses are seen.  No inguinal lymphadenopathy is seen.  No acute osseous abnormalities are identified.  Vacuum phenomenon is noted along the lower thoracic spine.  IMPRESSION:  1.  No acute abnormalities seen within the abdomen or pelvis. 2.  Calcification along the abdominal aorta and its branches. 3.  Likely small left renal cyst noted.  Original Report Authenticated By: Tonia Ghent, M.D.   IV fentanyl. IV Zofran. Labs and imaging obtained and reviewed as above. No UTI symptoms.  On recheck pain is feeling better, requesting further medications which were provided. Patient requesting to be discharged home first followup with her doctor in the clinic. No red flags or indication for emergent MRI at this time MDM   Low back pain  radiates to left leg. Evaluated with imaging as above. No fracture or bony abnormality noted. No AAA on CT. pain improving emergency department. Plan prescription for Percocet, Flexeril, and Decadron and followup with primary care physician. Reliable historian verbalizes understanding back pain precautions.         Sunnie Nielsen, MD 04/07/12 [phone]

## 2012-04-07 NOTE — ED Notes (Signed)
Pt started with back pain down legs at 3 pm today; since then increasingly worse; no relief with advil.  No known injury; pt unable to sit still

## 2012-04-09 ENCOUNTER — Encounter: Payer: Self-pay | Admitting: Internal Medicine

## 2012-04-09 ENCOUNTER — Ambulatory Visit (INDEPENDENT_AMBULATORY_CARE_PROVIDER_SITE_OTHER): Payer: Medicare Other | Admitting: Internal Medicine

## 2012-04-09 ENCOUNTER — Ambulatory Visit: Payer: Medicare Other | Admitting: Internal Medicine

## 2012-04-09 VITALS — BP 172/98 | HR 102 | Temp 97.0°F | Ht 63.5 in

## 2012-04-09 DIAGNOSIS — M5417 Radiculopathy, lumbosacral region: Secondary | ICD-10-CM

## 2012-04-09 DIAGNOSIS — IMO0002 Reserved for concepts with insufficient information to code with codable children: Secondary | ICD-10-CM | POA: Diagnosis not present

## 2012-04-09 DIAGNOSIS — M5416 Radiculopathy, lumbar region: Secondary | ICD-10-CM

## 2012-04-09 MED ORDER — DIAZEPAM 5 MG PO TABS: 5.0000 mg | ORAL_TABLET | Freq: Two times a day (BID) | ORAL | Status: DC | PRN

## 2012-04-09 MED ORDER — OXYCODONE-ACETAMINOPHEN 5-325 MG PO TABS: 2.0000 | ORAL_TABLET | Freq: Four times a day (QID) | ORAL | Status: DC | PRN

## 2012-04-09 NOTE — Patient Instructions (Signed)
It was good to see you today. Use Valium for sleep and muscle relaxer in place of flexeril - Your prescription(s) have been given to you to submit to your pharmacy. Please take as directed and contact our office if you believe you are having problem(s) with the medication(s). we'll make referral for urgent MRI back, then back surgeon as needed  . Our office will contact you regarding appointment(s) once made.

## 2012-04-09 NOTE — Progress Notes (Signed)
Subjective:    Patient ID: Victoria [name], female    DOB: 1941/05/01, 71 y.o.   MRN: [id number]  Back Pain This is a new problem. The current episode started in the past 7 days (ongoing since 02/2012, severe in past 4 days (04/06/12)). The problem occurs constantly. The problem has been gradually worsening since onset. The pain is present in the gluteal and lumbar spine. The quality of the pain is described as aching and burning. The pain radiates to the left knee and left thigh. The pain is at a severity of 10/10. The pain is severe. The pain is the same all the time. The symptoms are aggravated by standing, sitting and lying down. Stiffness is present all day. Associated symptoms include leg pain, numbness and tingling. Pertinent negatives include no abdominal pain, bladder incontinence, fever or pelvic pain. Risk factors include history of osteoporosis and menopause. She has tried analgesics, home exercises and walking for the symptoms. The treatment provided no relief.    Past Medical History  Diagnosis Date  . CAD (coronary artery disease)     unspecified site with NSTEMI 2009. Small OM treated with PCA  . Atopic eczema   . Osteoporosis     hx tailbone fx; bisphos >17yr tx, stopped 2011  . Hyperlipidemia     intol statin  . Hypothyroidism   . Essential hypertension, benign     white coat     Review of Systems  Constitutional: Positive for fatigue. Negative for fever and unexpected weight change.  Gastrointestinal: Negative for abdominal pain.  Genitourinary: Negative for bladder incontinence and pelvic pain.  Musculoskeletal: Positive for back pain.  Neurological: Positive for tingling and numbness.       Objective:   Physical Exam BP 172/98  Pulse 102  Temp 97 F (36.1 C) (Oral)  Ht 5' 3.5" (1.613 m)  SpO2 99% Constitutional: She appears well-developed and well-nourished. Tearful and distressed due to LLE pain - uncomfortable standing/shifting weight constantly. spouse  at side Cardiovascular: Normal rate, regular rhythm and normal heart sounds.  No murmur heard. No BLE edema. Pulmonary/Chest: Effort normal and breath sounds normal. No respiratory distress. She has no wheezes.  Musculoskeletal: Back: full range of motion of thoracic and lumbar spine. tender to palpation. Positive ipsilateral LLE straight leg raise. DTR's are diminished L patellar. Sensation intact in all dermatomes of the lower extremities. Diminished strength to manual muscle testing  In L quad. patient is able to heel toe walk without difficulty but ambulates with antalgic gait. L knee with Normal range of motion, no joint effusions. No gross deformities Skin: Skin is warm and dry. No rash noted. No erythema.   Lab Results  Component Value Date   WBC 10.5 04/07/2012   HGB 13.7 04/07/2012   HCT 39.5 04/07/2012   PLT 262 04/07/2012   GLUCOSE 111* 04/07/2012   CHOL 192 11/04/2011   TRIG 107.0 11/04/2011   HDL 44.60 11/04/2011   LDLCALC 126* 11/04/2011   ALT 17 11/04/2011   AST 17 11/04/2011   NA 136 04/07/2012   K 4.0 04/07/2012   CL 101 04/07/2012   CREATININE 0.72 04/07/2012   BUN 15 04/07/2012   CO2 23 04/07/2012   TSH 0.03* 03/12/2012   INR 0.89 04/07/2012   HGBA1C  Value: 5.4 (NOTE)   The ADA recommends the following therapeutic goals for glycemic   control related to Hgb A1C measurement:   Goal of Therapy:   < 7.0% Hgb A1C   Action Suggested:  >  8.0% Hgb A1C   Ref:  Diabetes Care, 22, Suppl. 1, 1999 11/10/2007    Ct Abdomen Pelvis Wo Contrast  04/07/2012  *RADIOLOGY REPORT*  Clinical Data: Back pain radiating down legs.  CT ABDOMEN AND PELVIS WITHOUT CONTRAST  Technique:  Multidetector CT imaging of the abdomen and pelvis was performed following the standard protocol without intravenous contrast.  Comparison: None.  Findings: Minimal atelectasis is noted at the right lung base.  The liver and spleen are unremarkable in appearance.  The gallbladder is within normal limits.  The pancreas and adrenal glands are  unremarkable.  A 1.4 cm hypodensity along the upper pole of the left kidney is thought to reflect a cyst, given its attenuation.  The kidneys are otherwise unremarkable in appearance.  There is no evidence of hydronephrosis.  No renal or ureteral stones are seen.  No perinephric stranding is appreciated.  No free fluid is identified.  The small bowel is unremarkable in appearance.  The stomach is within normal limits.  No acute vascular abnormalities are seen.  Calcification is noted along the abdominal aorta and its branches.  The patient is status post appendectomy.  The colon is partially filled with stool and is unremarkable in appearance.  The bladder is mildly distended and grossly unremarkable in appearance.  The patient is status post hysterectomy; no suspicious adnexal masses are seen.  No inguinal lymphadenopathy is seen.  No acute osseous abnormalities are identified.  Vacuum phenomenon is noted along the lower thoracic spine.  IMPRESSION:  1.  No acute abnormalities seen within the abdomen or pelvis. 2.  Calcification along the abdominal aorta and its branches. 3.  Likely small left renal cyst noted.  Original Report Authenticated By: Tonia Ghent, M.D.        Assessment & Plan:  L3-4 left lumbar radiculopathy - hx and exam consistent with ruptured disc No relief with oxycodone - intol of steroids/NSAIDs Change flexeril to valium for muscle relaxer Arrange for MRI urgently and refer to Nsurg when done

## 2012-04-09 NOTE — Addendum Note (Signed)
Addended by: Deatra James on: 04/09/2012 10:21 AM   Modules accepted: Orders

## 2012-04-10 ENCOUNTER — Ambulatory Visit
Admission: RE | Admit: 2012-04-10 | Discharge: 2012-04-10 | Disposition: A | Discharge status: Home / Self Care | Payer: Medicare Other | Source: Ambulatory Visit | Attending: Internal Medicine | Admitting: Internal Medicine

## 2012-04-10 ENCOUNTER — Telehealth: Payer: Self-pay | Admitting: Internal Medicine

## 2012-04-10 DIAGNOSIS — M5416 Radiculopathy, lumbar region: Secondary | ICD-10-CM

## 2012-04-10 DIAGNOSIS — M25559 Pain in unspecified hip: Secondary | ICD-10-CM | POA: Diagnosis not present

## 2012-04-10 DIAGNOSIS — M5126 Other intervertebral disc displacement, lumbar region: Secondary | ICD-10-CM | POA: Diagnosis not present

## 2012-04-10 DIAGNOSIS — M47817 Spondylosis without myelopathy or radiculopathy, lumbosacral region: Secondary | ICD-10-CM | POA: Diagnosis not present

## 2012-04-10 NOTE — Telephone Encounter (Signed)
Dr Felicity Coyer pt is scheduled to see Dr Venetia Maxon [facility]) on 04/11/12  @ 9am

## 2012-04-10 NOTE — Addendum Note (Signed)
Addended by: Rene Paci A on: 04/10/2012 02:12 PM   Modules accepted: Orders

## 2012-04-11 ENCOUNTER — Other Ambulatory Visit: Payer: Self-pay | Admitting: Neurosurgery

## 2012-04-11 ENCOUNTER — Encounter (HOSPITAL_COMMUNITY): Payer: Self-pay | Admitting: Pharmacy Technician

## 2012-04-11 ENCOUNTER — Other Ambulatory Visit: Payer: Self-pay | Admitting: *Deleted

## 2012-04-11 DIAGNOSIS — IMO0002 Reserved for concepts with insufficient information to code with codable children: Secondary | ICD-10-CM | POA: Diagnosis not present

## 2012-04-11 DIAGNOSIS — M47817 Spondylosis without myelopathy or radiculopathy, lumbosacral region: Secondary | ICD-10-CM | POA: Diagnosis not present

## 2012-04-11 DIAGNOSIS — M5126 Other intervertebral disc displacement, lumbar region: Secondary | ICD-10-CM | POA: Diagnosis not present

## 2012-04-11 DIAGNOSIS — M5137 Other intervertebral disc degeneration, lumbosacral region: Secondary | ICD-10-CM | POA: Diagnosis not present

## 2012-04-11 DIAGNOSIS — M545 Low back pain: Secondary | ICD-10-CM | POA: Diagnosis not present

## 2012-04-11 MED ORDER — OXYCODONE-ACETAMINOPHEN 5-325 MG PO TABS: 1.0000 | ORAL_TABLET | ORAL | Status: AC | PRN

## 2012-04-11 NOTE — Telephone Encounter (Signed)
Noted thanks °

## 2012-04-11 NOTE — Telephone Encounter (Signed)
Pt's spouse called regarding Oxycodone rx. They state that pt has appointment for surgery next Tuesday and has about 12 pills left. They are concerned that the pills will not last until Tuesday and are asking for refill-please advise.

## 2012-04-11 NOTE — Telephone Encounter (Signed)
Ok to refill - after surgery, any needed pain pills will come from Dr Venetia Maxon

## 2012-04-11 NOTE — Telephone Encounter (Signed)
Left message for pt's spouse to callback office-rx placed upfront in cabinet ready for pickup.

## 2012-04-12 NOTE — Telephone Encounter (Signed)
Pt's spouse informed of rx and of MD's advisement regarding future refills.

## 2012-04-16 ENCOUNTER — Encounter (HOSPITAL_COMMUNITY)
Admission: RE | Admit: 2012-04-16 | Discharge: 2012-04-16 | Disposition: A | Discharge status: Home / Self Care | Payer: Medicare Other | Source: Ambulatory Visit | Attending: Neurosurgery | Admitting: Neurosurgery

## 2012-04-16 ENCOUNTER — Encounter (HOSPITAL_COMMUNITY): Payer: Self-pay

## 2012-04-16 DIAGNOSIS — Z01811 Encounter for preprocedural respiratory examination: Secondary | ICD-10-CM | POA: Diagnosis not present

## 2012-04-16 DIAGNOSIS — M47817 Spondylosis without myelopathy or radiculopathy, lumbosacral region: Secondary | ICD-10-CM | POA: Diagnosis not present

## 2012-04-16 DIAGNOSIS — M5137 Other intervertebral disc degeneration, lumbosacral region: Secondary | ICD-10-CM | POA: Diagnosis not present

## 2012-04-16 DIAGNOSIS — Z01812 Encounter for preprocedural laboratory examination: Secondary | ICD-10-CM | POA: Diagnosis not present

## 2012-04-16 DIAGNOSIS — M5126 Other intervertebral disc displacement, lumbar region: Secondary | ICD-10-CM | POA: Diagnosis not present

## 2012-04-16 LAB — CBC
MCH: 31.1 pg (ref 26.0–34.0)
MCHC: 34.1 g/dL (ref 30.0–36.0)
MCV: 91.4 fL (ref 78.0–100.0)
Platelets: 251 10*3/uL (ref 150–400)
RBC: 4.53 MIL/uL (ref 3.87–5.11)
RDW: 12.7 % (ref 11.5–15.5)

## 2012-04-16 LAB — BASIC METABOLIC PANEL
Calcium: 10.4 mg/dL (ref 8.4–10.5)
Creatinine, Ser: 0.74 mg/dL (ref 0.50–1.10)
GFR calc Af Amer: >90 mL/min (ref >90)
GFR calc non Af Amer: 84 mL/min — ABNORMAL LOW (ref >90)
Sodium: 141 mEq/L (ref 135–145)

## 2012-04-16 LAB — SURGICAL PCR SCREEN: Staphylococcus aureus: NEGATIVE

## 2012-04-16 MED ORDER — VANCOMYCIN HCL IN DEXTROSE 1-5 GM/200ML-% IV SOLN
1000.0000 mg | INTRAVENOUS | Status: DC
  Filled 2012-04-16: qty 200

## 2012-04-16 NOTE — H&P (Signed)
NEUROSURGICAL CONSULTATION   Victoria [name]   DOB: 07/02/1941 #[id number]    April 11, 2012   HISTORY:     Victoria [name] is a 71 year old retired woman who presents today at the request of Dr. Rene Paci for severe low back and left leg pain.  She currently describes her pain as 10/10 in severity. She says that it goes down her left leg and has been going on for the last five days and has become essentially unbearable.  She tried taking Oxycodone, but had nausea and vomiting with that medication. She has been using Valium 5 mg. twice daily. She had a Prednisone taper ordered, but did not start that due to problems with insomnia.  She had coughing last month and at that point developed left leg pain.    REVIEW OF SYSTEMS:   A detailed Review of Systems sheet was reviewed with the patient.  Pertinent positives include under eyes - she wears glasses, under cardiovascular - she notes chest pain, angina, and high cholesterol, under gastrointestinal - she notes nausea and vomiting with Oxycodone, under musculoskeletal - she notes back pain and leg pain, under neurologic - she notes problems with her memory, under integumentary - she notes skin disease, under allergic - she notes a shellfish allergy.  All other systems are negative; this includes Constitutional symptoms, Ears, nose, mouth, throat, Endocrine, Respiratory, Genitourinary, Breast, Psychiatric, Hematologic/Lymphatic, and Immunologic.    PAST MEDICAL HISTORY:      Current Medical Conditions:    She has a history of myocardial infarction in 2009 and Dr. Tonny Bollman is her cardiologist.  She also had a staph infection in the 1990s.  She has occasional angina and hypothyroidism, and has a history of osteoporosis. She stopped the Fosamax.      Prior Operations and Hospitalizations:   She had a hysterectomy in the 1990s.  She had a failed stent attempt in 2009.      Medications and Allergies:  Her current medications include Levothyroxine  110 mcg. daily, Zetia once daily, multivitamin once daily, CoQ10 400 mg. daily, and Aspirin 81 mg. daily.  She is ALLERGIC TO PENICILLIN, E-MYCIN, AND CIPRO.      Height and Weight:     She is 5'4" tall, 145 lbs.    FAMILY HISTORY:    Both parents are deceased, cause unspecified.    SOCIAL HISTORY:    She denies tobacco or drug use.  She is a social drinker of alcoholic beverages.    DIAGNOSTIC STUDIES:   An MRI was reviewed in the office today which shows a free fragment of herniation of lumbar disc material at the level of the L5-S1 disc, with significant left-sided caudally migrated free fragment causing significant mass effect on the left S1 nerve root.    Radiographs show well aligned lumbar vertebrae without abnormal motion on flexion and extension views.    PHYSICAL EXAMINATION:      General Appearance:   On examination today, [name] is a pleasant and cooperative woman in no acute distress.      Blood Pressure, Pulse:     Her blood pressure is 130/82.  Heart rate is 70 and regular.  Respiratory rate is 18.      HEENT - normocephalic, atraumatic.  The pupils are equal, round and reactive to light.  The extraocular muscles are intact.  Sclerae - white.  Conjunctiva - pink.  Oropharynx benign.  Uvula midline.     Neck - there are no masses,  meningismus, deformities, tracheal deviation, jugular vein distention or carotid bruits.  There is normal cervical range of motion.  Spurlings' test is negative without reproducible radicular pain turning the patient's head to either side.  Lhermitte's sign is not present with axial compression.      Respiratory - there is normal respiratory effort with good intercostal function.  Lungs are clear to auscultation.  There are no rales, rhonchi or wheezes.      Cardiovascular - the heart has regular rate and rhythm to auscultation.  No murmurs are appreciated.  There is no extremity edema, cyanosis or clubbing.  There are palpable pedal pulses.       Abdomen - soft, nontender, no hepatosplenomegaly appreciated or masses.  There are active bowel sounds.  No guarding or rebound.      Musculoskeletal Examination - she is barely able to stand on her left leg and complains of severe unrelenting pain, and describes it as 10/10.  She cannot bear full weight on her left leg. She has a positive straight leg raise at 25 degrees on the left.      NEUROLOGICAL EXAMINATION: The patient is oriented to time, person and place and has good recall of both recent and remote memory with normal attention span and concentration.  The patient speaks with clear and fluent speech and exhibits normal language function and appropriate fund of knowledge.      Cranial Nerve Examination - pupils are equal, round and reactive to light.  Extraocular movements are full.  Visual fields are full to confrontational testing.  Facial sensation and facial movement are symmetric and intact.  Hearing is intact to finger rub.  Palate is upgoing.  Shoulder shrug is symmetric.  Tongue protrudes in the midline.      Motor Examination - motor strength is 5/5 in the bilateral deltoids, biceps, triceps, handgrips, wrist extensors, interosseous.  In the lower extremities motor strength is 5/5 in hip flexion, extension, quadriceps, hamstrings, dorsiflexion and extensor hallucis longus, with the exception of some mild plantar flexion weakness.      Sensory Examination - normal to light touch and pinprick sensation in the upper and lower extremities.     Deep Tendon Reflexes - 2 in the biceps, triceps, and brachioradialis, 2 in the knees, 2 in the right ankle, absent at the left ankle.  The great toes are downgoing to plantar stimulation.      Cerebellar Examination - normal coordination in upper and lower extremities and normal rapid alternating movements.  Romberg test is negative.    IMPRESSION AND RECOMMENDATIONS: Victoria [name] is a 71 year old woman with a large free fragment disc  herniation at L5-S1 on the left.  She is unable to bear weight and is quite miserable due to pain.    I spoke with Dr. Excell Seltzer today on the phone who reviewed her cardiology treatments and says that he thought she was at acceptable risk to go ahead with surgery. I do not believe there is a nonsurgical option for Mrs. Barrell and a microdiscectomy should help relieve her left leg pain. We plan on doing so on 04/17/2012.  Risks and benefits were discussed with the patient.  Patient education was performed with my nurse, Georgiann Cocker, RN, who reviewed models and discussed the details of the hospital stay, and the expectations around surgery. She wishes to proceed.     [facility]    Danae Orleans. Venetia Maxon, M.D.  JDS:aft cc: Dr. Rene Paci

## 2012-04-16 NOTE — Consult Note (Signed)
Anesthesia Chart Review:  Patient is a 71 year old female scheduled for left L5-S1 microdiskectomy on 04/17/12.  Her PAT appointment ws on 04/16/12.  (She was just evaluated by Dr. Venetia Maxon on 04/11/12.)   History includes non-smoker, CAD, NSTEMI '09 s/p angioplasty Cx, HLD, HTN, hypothyroidism, osteoporosis.  PCP is Dr. Rene Paci.    Her Cardiologist is Dr. Excell Seltzer.  Her last visit with him was on 11/08/11.  She described episode of chest pain that had both typical and atypical features.  Since she had had a normal stress test within the past year, he recommended continued medical management at that time with consideration of cardiac cath if progressive symptoms.  I called and spoke with Ms. Hiegel.  She reports that since February she has had her levothyroxine dose decreased twice, and has not any chest pain since her last adjustment over 6 weeks ago.  Shanda Bumps from [facility] and the patient both confirm that Dr. Venetia Maxon called Dr. Excell Seltzer last week and got verbal clearance for this procedure.  Shanda Bumps to fax Dr. Fredrich Birks office note to Short Stay.)  Her exercise stress test on 06/10/11 showed no evidence of ischemia by ST analysis.     Cardiac cath on 11/11/07 showed:  1. Severe (95%) second obtuse marginal stenosis with successful percutaneous transluminal coronary angioplasty.  2. Moderate diffuse left anterior descending coronary artery stenosis.  (The proximal LAD first septal perforator had  a calcified 50-60% stenosis. Just beyond this area at the origin of the first diagonal branch there was a second 50% stenosis that extends to the second diagonal branch.) 3. Mild (20% proximal RCA) right coronary artery stenosis.  4. Focal lateral wall akinesis with preserved overall left ventricular function.  EF 60-65%.  There was no acute abnormality on 04/16/12 CXR.  Labs noted.    If no acute changes, anticipate she can proceed as planned.  Shonna Chock, PA-C

## 2012-04-16 NOTE — Pre-Procedure Instructions (Signed)
20 Victoria [name]  04/16/2012   Your procedure is scheduled on:  04/17/12  Report to [facility] at 1200 pm  Call this number if you have problems the morning of surgery: [phone]   Remember:   Do not eat food:After Midnight.  May have clear liquids:until Midnight .  Clear liquids include soda, tea, black coffee, apple or grape juice, broth.  Take these medicines the morning of surgery with A SIP OF WATER: valum,synthroid,percocet,nitrostat   Do not wear jewelry, make-up or nail polish.  Do not wear lotions, powders, or perfumes. You may wear deodorant.  Do not shave 48 hours prior to surgery. Men may shave face and neck.  Do not bring valuables to the hospital.  Contacts, dentures or bridgework may not be worn into surgery.  Leave suitcase in the car. After surgery it may be brought to your room.  For patients admitted to the hospital, checkout time is 11:00 AM the day of discharge.   Patients discharged the day of surgery will not be allowed to drive home.  Name and phone number of your driver: family  Special Instructions: CHG Shower Use Special Wash: 1/2 bottle night before surgery and 1/2 bottle morning of surgery.   Please read over the following fact sheets that you were given: Pain Booklet, Coughing and Deep Breathing, MRSA Information and Surgical Site Infection Prevention

## 2012-04-17 ENCOUNTER — Inpatient Hospital Stay (HOSPITAL_COMMUNITY): Payer: Medicare Other | Admitting: Vascular Surgery

## 2012-04-17 ENCOUNTER — Ambulatory Visit (HOSPITAL_COMMUNITY)
Admission: RE | Admit: 2012-04-17 | Discharge: 2012-04-17 | Disposition: A | Discharge status: Home / Self Care | Payer: Medicare Other | Source: Ambulatory Visit | Attending: Neurosurgery | Admitting: Neurosurgery

## 2012-04-17 ENCOUNTER — Encounter (HOSPITAL_COMMUNITY): Payer: Self-pay | Admitting: Vascular Surgery

## 2012-04-17 ENCOUNTER — Encounter (HOSPITAL_COMMUNITY)
Admission: RE | Disposition: A | Discharge status: Home / Self Care | Payer: Self-pay | Source: Ambulatory Visit | Attending: Neurosurgery

## 2012-04-17 ENCOUNTER — Inpatient Hospital Stay (HOSPITAL_COMMUNITY): Payer: Medicare Other

## 2012-04-17 ENCOUNTER — Encounter (HOSPITAL_COMMUNITY): Payer: Self-pay | Admitting: *Deleted

## 2012-04-17 DIAGNOSIS — M51379 Other intervertebral disc degeneration, lumbosacral region without mention of lumbar back pain or lower extremity pain: Secondary | ICD-10-CM | POA: Insufficient documentation

## 2012-04-17 DIAGNOSIS — E039 Hypothyroidism, unspecified: Secondary | ICD-10-CM

## 2012-04-17 DIAGNOSIS — IMO0002 Reserved for concepts with insufficient information to code with codable children: Secondary | ICD-10-CM | POA: Diagnosis not present

## 2012-04-17 DIAGNOSIS — Z01812 Encounter for preprocedural laboratory examination: Secondary | ICD-10-CM | POA: Insufficient documentation

## 2012-04-17 DIAGNOSIS — M5126 Other intervertebral disc displacement, lumbar region: Secondary | ICD-10-CM | POA: Diagnosis not present

## 2012-04-17 DIAGNOSIS — E785 Hyperlipidemia, unspecified: Secondary | ICD-10-CM | POA: Diagnosis not present

## 2012-04-17 DIAGNOSIS — M47817 Spondylosis without myelopathy or radiculopathy, lumbosacral region: Secondary | ICD-10-CM | POA: Insufficient documentation

## 2012-04-17 DIAGNOSIS — M5137 Other intervertebral disc degeneration, lumbosacral region: Secondary | ICD-10-CM | POA: Insufficient documentation

## 2012-04-17 DIAGNOSIS — M519 Unspecified thoracic, thoracolumbar and lumbosacral intervertebral disc disorder: Secondary | ICD-10-CM | POA: Diagnosis not present

## 2012-04-17 HISTORY — PX: LUMBAR LAMINECTOMY/DECOMPRESSION MICRODISCECTOMY: SHX5026

## 2012-04-17 SURGERY — LUMBAR LAMINECTOMY/DECOMPRESSION MICRODISCECTOMY 1 LEVEL
Anesthesia: General | Site: Back | Laterality: Left | Wound class: Clean

## 2012-04-17 MED ORDER — GLYCOPYRROLATE 0.2 MG/ML IJ SOLN
INTRAMUSCULAR | Status: DC | PRN
  Administered 2012-04-17: .5 mg via INTRAVENOUS

## 2012-04-17 MED ORDER — SODIUM CHLORIDE 0.9 % IJ SOLN: 3.0000 mL | Freq: Two times a day (BID) | INTRAMUSCULAR | Status: DC

## 2012-04-17 MED ORDER — LACTATED RINGERS IV SOLN
INTRAVENOUS | Status: DC | PRN
  Administered 2012-04-17: 13:00:00 via INTRAVENOUS

## 2012-04-17 MED ORDER — SODIUM CHLORIDE 0.9 % IV SOLN
INTRAVENOUS | Status: AC
  Filled 2012-04-17: qty 500

## 2012-04-17 MED ORDER — CALCIUM CARB-CHOLECALCIFEROL 600-400 MG-UNIT PO TABS: 1.0000 | ORAL_TABLET | Freq: Two times a day (BID) | ORAL | Status: DC

## 2012-04-17 MED ORDER — DICLOFENAC SODIUM 75 MG PO TBEC
75.0000 mg | DELAYED_RELEASE_TABLET | Freq: Two times a day (BID) | ORAL | Status: DC
  Filled 2012-04-17: qty 1

## 2012-04-17 MED ORDER — COENZYME Q10 400 MG PO CAPS: 1.0000 | ORAL_CAPSULE | Freq: Every day | ORAL | Status: DC

## 2012-04-17 MED ORDER — PROSIGHT PO TABS
1.0000 | ORAL_TABLET | Freq: Every day | ORAL | Status: DC
  Filled 2012-04-17: qty 1

## 2012-04-17 MED ORDER — BACITRACIN 50000 UNITS IM SOLR
INTRAMUSCULAR | Status: AC
  Filled 2012-04-17: qty 1

## 2012-04-17 MED ORDER — SENNOSIDES-DOCUSATE SODIUM 8.6-50 MG PO TABS: 1.0000 | ORAL_TABLET | Freq: Every evening | ORAL | Status: DC | PRN

## 2012-04-17 MED ORDER — NEOSTIGMINE METHYLSULFATE 1 MG/ML IJ SOLN
INTRAMUSCULAR | Status: DC | PRN
  Administered 2012-04-17: 3 mg via INTRAVENOUS

## 2012-04-17 MED ORDER — SENNA 8.6 MG PO TABS
1.0000 | ORAL_TABLET | Freq: Two times a day (BID) | ORAL | Status: DC
  Filled 2012-04-17: qty 1

## 2012-04-17 MED ORDER — BUPIVACAINE HCL (PF) 0.5 % IJ SOLN
INTRAMUSCULAR | Status: DC | PRN
  Administered 2012-04-17: 5 mL

## 2012-04-17 MED ORDER — ONDANSETRON HCL 4 MG/2ML IJ SOLN: 4.0000 mg | Freq: Once | INTRAMUSCULAR | Status: DC | PRN

## 2012-04-17 MED ORDER — SUFENTANIL CITRATE 50 MCG/ML IV SOLN
INTRAVENOUS | Status: DC | PRN
  Administered 2012-04-17: 20 ug via INTRAVENOUS

## 2012-04-17 MED ORDER — ASPIRIN 81 MG PO CHEW: 81.0000 mg | CHEWABLE_TABLET | Freq: Every day | ORAL | Status: DC

## 2012-04-17 MED ORDER — LIDOCAINE HCL (CARDIAC) 20 MG/ML IV SOLN
INTRAVENOUS | Status: DC | PRN
  Administered 2012-04-17: 40 mg via INTRAVENOUS

## 2012-04-17 MED ORDER — FLEET ENEMA 7-19 GM/118ML RE ENEM: 1.0000 | ENEMA | Freq: Once | RECTAL | Status: DC | PRN

## 2012-04-17 MED ORDER — SODIUM CHLORIDE 0.9 % IR SOLN
Status: DC | PRN
  Administered 2012-04-17: 14:00:00

## 2012-04-17 MED ORDER — CLOBETASOL PROPIONATE 0.05 % EX SOLN: 1.0000 "application " | Freq: Two times a day (BID) | CUTANEOUS | Status: DC

## 2012-04-17 MED ORDER — DIPHENHYDRAMINE HCL 25 MG PO CAPS: 25.0000 mg | ORAL_CAPSULE | Freq: Every day | ORAL | Status: DC

## 2012-04-17 MED ORDER — OXYCODONE-ACETAMINOPHEN 5-325 MG PO TABS
ORAL_TABLET | ORAL | Status: AC
  Filled 2012-04-17: qty 2

## 2012-04-17 MED ORDER — ROCURONIUM BROMIDE 100 MG/10ML IV SOLN
INTRAVENOUS | Status: DC | PRN
  Administered 2012-04-17: 50 mg via INTRAVENOUS

## 2012-04-17 MED ORDER — SODIUM CHLORIDE 0.9 % IJ SOLN: 3.0000 mL | INTRAMUSCULAR | Status: DC | PRN

## 2012-04-17 MED ORDER — HEMOSTATIC AGENTS (NO CHARGE) OPTIME
TOPICAL | Status: DC | PRN
  Administered 2012-04-17: 1 via TOPICAL

## 2012-04-17 MED ORDER — FENTANYL CITRATE 0.05 MG/ML IJ SOLN
INTRAMUSCULAR | Status: AC
  Filled 2012-04-17: qty 2

## 2012-04-17 MED ORDER — CALCIUM CARBONATE 1250 (500 CA) MG PO TABS
1.0000 | ORAL_TABLET | Freq: Two times a day (BID) | ORAL | Status: DC
  Filled 2012-04-17: qty 1

## 2012-04-17 MED ORDER — ONDANSETRON HCL 4 MG/2ML IJ SOLN
INTRAMUSCULAR | Status: DC | PRN
  Administered 2012-04-17: 4 mg via INTRAVENOUS

## 2012-04-17 MED ORDER — DIAZEPAM 5 MG PO TABS: 5.0000 mg | ORAL_TABLET | Freq: Two times a day (BID) | ORAL | Status: DC | PRN

## 2012-04-17 MED ORDER — MIDAZOLAM HCL 5 MG/5ML IJ SOLN
INTRAMUSCULAR | Status: DC | PRN
  Administered 2012-04-17: 2 mg via INTRAVENOUS

## 2012-04-17 MED ORDER — FLUOCINONIDE 0.05 % EX CREA
TOPICAL_CREAM | Freq: Two times a day (BID) | CUTANEOUS | Status: DC
  Filled 2012-04-17: qty 30

## 2012-04-17 MED ORDER — EYE VITAMINS PO TABS: 1.0000 | ORAL_TABLET | Freq: Every day | ORAL | Status: DC

## 2012-04-17 MED ORDER — KCL IN DEXTROSE-NACL 20-5-0.45 MEQ/L-%-% IV SOLN
INTRAVENOUS | Status: DC
  Filled 2012-04-17: qty 1000

## 2012-04-17 MED ORDER — NITROGLYCERIN 0.4 MG SL SUBL: 0.4000 mg | SUBLINGUAL_TABLET | SUBLINGUAL | Status: DC | PRN

## 2012-04-17 MED ORDER — HYDROMORPHONE HCL PF 1 MG/ML IJ SOLN
INTRAMUSCULAR | Status: AC
  Filled 2012-04-17: qty 1

## 2012-04-17 MED ORDER — THROMBIN 5000 UNITS EX SOLR
CUTANEOUS | Status: DC | PRN
  Administered 2012-04-17 (×2): 5000 [IU] via TOPICAL

## 2012-04-17 MED ORDER — LEVOTHYROXINE SODIUM 100 MCG PO TABS
100.0000 ug | ORAL_TABLET | Freq: Every day | ORAL | Status: DC
  Filled 2012-04-17: qty 1

## 2012-04-17 MED ORDER — PHENOL 1.4 % MT LIQD: 1.0000 | OROMUCOSAL | Status: DC | PRN

## 2012-04-17 MED ORDER — FENTANYL CITRATE 0.05 MG/ML IJ SOLN
INTRAMUSCULAR | Status: DC | PRN
  Administered 2012-04-17: 100 ug via INTRAVENOUS

## 2012-04-17 MED ORDER — ZOLPIDEM TARTRATE 5 MG PO TABS: 5.0000 mg | ORAL_TABLET | Freq: Every evening | ORAL | Status: DC | PRN

## 2012-04-17 MED ORDER — LIDOCAINE-EPINEPHRINE 1 %-1:100000 IJ SOLN
INTRAMUSCULAR | Status: DC | PRN
  Administered 2012-04-17: 5 mL

## 2012-04-17 MED ORDER — OXYCODONE-ACETAMINOPHEN 5-325 MG PO TABS: 1.0000 | ORAL_TABLET | ORAL | Status: DC | PRN

## 2012-04-17 MED ORDER — ACETAMINOPHEN 325 MG PO TABS: 650.0000 mg | ORAL_TABLET | ORAL | Status: DC | PRN

## 2012-04-17 MED ORDER — ACETAMINOPHEN 10 MG/ML IV SOLN: 1000.0000 mg | Freq: Once | INTRAVENOUS | Status: DC | PRN

## 2012-04-17 MED ORDER — ASPIRIN 81 MG PO TABS: 81.0000 mg | ORAL_TABLET | Freq: Every day | ORAL | Status: DC

## 2012-04-17 MED ORDER — ACETAMINOPHEN 650 MG RE SUPP: 650.0000 mg | RECTAL | Status: DC | PRN

## 2012-04-17 MED ORDER — METHYLPREDNISOLONE ACETATE 80 MG/ML IJ SUSP
INTRAMUSCULAR | Status: DC | PRN
  Administered 2012-04-17: 80 mg

## 2012-04-17 MED ORDER — ONDANSETRON HCL 4 MG/2ML IJ SOLN: 4.0000 mg | INTRAMUSCULAR | Status: DC | PRN

## 2012-04-17 MED ORDER — VANCOMYCIN HCL 1000 MG IV SOLR
1000.0000 mg | INTRAVENOUS | Status: DC | PRN
  Administered 2012-04-17: 1000 mg via INTRAVENOUS

## 2012-04-17 MED ORDER — OXYCODONE-ACETAMINOPHEN 5-325 MG PO TABS
1.0000 | ORAL_TABLET | ORAL | Status: DC | PRN
  Administered 2012-04-17: 1 via ORAL

## 2012-04-17 MED ORDER — DOCUSATE SODIUM 100 MG PO CAPS: 100.0000 mg | ORAL_CAPSULE | Freq: Two times a day (BID) | ORAL | Status: DC

## 2012-04-17 MED ORDER — BISACODYL 10 MG RE SUPP: 10.0000 mg | Freq: Every day | RECTAL | Status: DC | PRN

## 2012-04-17 MED ORDER — PROPOFOL 10 MG/ML IV EMUL
INTRAVENOUS | Status: DC | PRN
  Administered 2012-04-17: 120 mg via INTRAVENOUS

## 2012-04-17 MED ORDER — CHOLECALCIFEROL 10 MCG (400 UNIT) PO TABS
400.0000 [IU] | ORAL_TABLET | Freq: Two times a day (BID) | ORAL | Status: DC
  Filled 2012-04-17: qty 1

## 2012-04-17 MED ORDER — HYDROMORPHONE HCL PF 1 MG/ML IJ SOLN
0.2500 mg | INTRAMUSCULAR | Status: DC | PRN
  Administered 2012-04-17 (×2): 0.5 mg via INTRAVENOUS

## 2012-04-17 MED ORDER — HYDROCODONE-ACETAMINOPHEN 5-325 MG PO TABS: 1.0000 | ORAL_TABLET | ORAL | Status: DC | PRN

## 2012-04-17 MED ORDER — MENTHOL 3 MG MT LOZG: 1.0000 | LOZENGE | OROMUCOSAL | Status: DC | PRN

## 2012-04-17 MED ORDER — SODIUM CHLORIDE 0.9 % IV SOLN: 250.0000 mL | INTRAVENOUS | Status: DC

## 2012-04-17 MED ORDER — EZETIMIBE 10 MG PO TABS
10.0000 mg | ORAL_TABLET | Freq: Every day | ORAL | Status: DC
  Filled 2012-04-17: qty 1

## 2012-04-17 MED ORDER — MORPHINE SULFATE 2 MG/ML IJ SOLN: 1.0000 mg | INTRAMUSCULAR | Status: DC | PRN

## 2012-04-17 SURGICAL SUPPLY — 58 items
ADH SKN CLS APL DERMABOND .7 (GAUZE/BANDAGES/DRESSINGS) ×1
APL SKNCLS STERI-STRIP NONHPOA (GAUZE/BANDAGES/DRESSINGS)
BAG DECANTER FOR FLEXI CONT (MISCELLANEOUS) ×2 IMPLANT
BENZOIN TINCTURE PRP APPL 2/3 (GAUZE/BANDAGES/DRESSINGS) IMPLANT
BIT DRILL NEURO 2X3.1 SFT TUCH (MISCELLANEOUS) ×1 IMPLANT
BLADE SURG ROTATE 9660 (MISCELLANEOUS) IMPLANT
BUR ROUND FLUTED 5 RND (BURR) ×2 IMPLANT
CANISTER SUCTION 2500CC (MISCELLANEOUS) ×2 IMPLANT
CLOTH BEACON ORANGE TIMEOUT ST (SAFETY) ×2 IMPLANT
CONT SPEC 4OZ CLIKSEAL STRL BL (MISCELLANEOUS) ×2 IMPLANT
DERMABOND ADVANCED (GAUZE/BANDAGES/DRESSINGS) ×1
DERMABOND ADVANCED .7 DNX12 (GAUZE/BANDAGES/DRESSINGS) ×1 IMPLANT
DRAPE LAPAROTOMY 100X72X124 (DRAPES) ×2 IMPLANT
DRAPE MICROSCOPE LEICA (MISCELLANEOUS) ×2 IMPLANT
DRAPE POUCH INSTRU U-SHP 10X18 (DRAPES) ×2 IMPLANT
DRAPE SURG 17X23 STRL (DRAPES) ×2 IMPLANT
DRESSING TELFA 8X3 (GAUZE/BANDAGES/DRESSINGS) IMPLANT
DRILL NEURO 2X3.1 SOFT TOUCH (MISCELLANEOUS) ×2
DURAPREP 26ML APPLICATOR (WOUND CARE) ×2 IMPLANT
ELECT REM PT RETURN 9FT ADLT (ELECTROSURGICAL) ×2
ELECTRODE REM PT RTRN 9FT ADLT (ELECTROSURGICAL) ×1 IMPLANT
GAUZE SPONGE 4X4 16PLY XRAY LF (GAUZE/BANDAGES/DRESSINGS) IMPLANT
GLOVE BIO SURGEON STRL SZ8 (GLOVE) ×2 IMPLANT
GLOVE BIOGEL PI IND STRL 8 (GLOVE) ×1 IMPLANT
GLOVE BIOGEL PI IND STRL 8.5 (GLOVE) ×1 IMPLANT
GLOVE BIOGEL PI INDICATOR 8 (GLOVE) ×1
GLOVE BIOGEL PI INDICATOR 8.5 (GLOVE) ×1
GLOVE ECLIPSE 8.0 STRL XLNG CF (GLOVE) IMPLANT
GLOVE EXAM NITRILE LRG STRL (GLOVE) IMPLANT
GLOVE EXAM NITRILE MD LF STRL (GLOVE) IMPLANT
GLOVE EXAM NITRILE XL STR (GLOVE) IMPLANT
GLOVE EXAM NITRILE XS STR PU (GLOVE) IMPLANT
GLOVE SURG SS PI 7.0 STRL IVOR (GLOVE) ×6 IMPLANT
GLOVE SURG SS PI 8.5 STRL IVOR (GLOVE) ×2
GLOVE SURG SS PI 8.5 STRL STRW (GLOVE) ×2 IMPLANT
GOWN BRE IMP SLV AUR LG STRL (GOWN DISPOSABLE) IMPLANT
GOWN BRE IMP SLV AUR XL STRL (GOWN DISPOSABLE) ×6 IMPLANT
GOWN STRL REIN 2XL LVL4 (GOWN DISPOSABLE) ×2 IMPLANT
KIT BASIN OR (CUSTOM PROCEDURE TRAY) ×2 IMPLANT
KIT ROOM TURNOVER OR (KITS) ×2 IMPLANT
NEEDLE HYPO 18GX1.5 BLUNT FILL (NEEDLE) IMPLANT
NEEDLE HYPO 25X1 1.5 SAFETY (NEEDLE) ×2 IMPLANT
NS IRRIG 1000ML POUR BTL (IV SOLUTION) ×2 IMPLANT
PACK LAMINECTOMY NEURO (CUSTOM PROCEDURE TRAY) ×2 IMPLANT
PAD ARMBOARD 7.5X6 YLW CONV (MISCELLANEOUS) ×6 IMPLANT
RUBBERBAND STERILE (MISCELLANEOUS) ×4 IMPLANT
SPONGE GAUZE 4X4 12PLY (GAUZE/BANDAGES/DRESSINGS) IMPLANT
SPONGE SURGIFOAM ABS GEL SZ50 (HEMOSTASIS) ×2 IMPLANT
STRIP CLOSURE SKIN 1/2X4 (GAUZE/BANDAGES/DRESSINGS) IMPLANT
SUT VIC AB 0 CT1 18XCR BRD8 (SUTURE) ×1 IMPLANT
SUT VIC AB 0 CT1 8-18 (SUTURE) ×2
SUT VIC AB 2-0 CT1 18 (SUTURE) ×2 IMPLANT
SUT VIC AB 3-0 SH 8-18 (SUTURE) ×2 IMPLANT
SYR 20ML ECCENTRIC (SYRINGE) ×2 IMPLANT
SYR 5ML LL (SYRINGE) IMPLANT
TOWEL OR 17X24 6PK STRL BLUE (TOWEL DISPOSABLE) ×2 IMPLANT
TOWEL OR 17X26 10 PK STRL BLUE (TOWEL DISPOSABLE) ×2 IMPLANT
WATER STERILE IRR 1000ML POUR (IV SOLUTION) ×2 IMPLANT

## 2012-04-17 NOTE — Interval H&P Note (Signed)
History and Physical Interval Note:  04/17/2012 6:45 AM  Victoria [name]  has presented today for surgery, with the diagnosis of Lumbar hnp without myelopathy, Lumbar spondylosis, Lumbar degenerative disc disease, Lumbar radiculopathy  The various methods of treatment have been discussed with the patient and family. After consideration of risks, benefits and other options for treatment, the patient has consented to  Procedure(s) (LRB): LUMBAR LAMINECTOMY/DECOMPRESSION MICRODISCECTOMY 1 LEVEL (Left) as a surgical intervention .  The patient's history has been reviewed, patient examined, no change in status, stable for surgery.  I have reviewed the patients' chart and labs.  Questions were answered to the patient's satisfaction.     Dmetrius Ambs D  Date of Initial H&P: 04/11/2012  History reviewed, patient examined, no change in status, stable for surgery.

## 2012-04-17 NOTE — Anesthesia Preprocedure Evaluation (Signed)
Anesthesia Evaluation  Patient identified by MRN, date of birth, ID band Patient awake    Reviewed: Allergy & Precautions, H&P , NPO status , Patient's Chart, lab work & pertinent test results  Airway Mallampati: II      Dental  (+) Teeth Intact   Pulmonary  breath sounds clear to auscultation        Cardiovascular Rhythm:Regular Rate:Normal     Neuro/Psych    GI/Hepatic   Endo/Other    Renal/GU      Musculoskeletal   Abdominal   Peds  Hematology   Anesthesia Other Findings   Reproductive/Obstetrics                           Anesthesia Physical Anesthesia Plan  ASA: III  Anesthesia Plan: General   Post-op Pain Management:    Induction: Intravenous  Airway Management Planned: Oral ETT  Additional Equipment:   Intra-op Plan:   Post-operative Plan: Extubation in OR  Informed Consent: I have reviewed the patients History and Physical, chart, labs and discussed the procedure including the risks, benefits and alternatives for the proposed anesthesia with the patient or authorized representative who has indicated his/her understanding and acceptance.   Dental advisory given  Plan Discussed with:   Anesthesia Plan Comments: (HNP L5-S1 CAD S/P MI 2009, Neg stress test 9.7/12 felt to be OK for surgery per Dr. Excell Seltzer Multiple allergies Hypothyroidism H/O Post-op N/V  Plan GA  Kipp Brood, MD )        Anesthesia Quick Evaluation

## 2012-04-17 NOTE — Progress Notes (Signed)
Patient awake, alert, conversant.  Full strength bilateral lower extremities with resolved left leg pain.  No numbness.  Doing well post op.

## 2012-04-17 NOTE — Op Note (Signed)
04/17/2012  2:39 PM  PATIENT:  Victoria [name]  71 y.o. female  PRE-OPERATIVE DIAGNOSIS:  Lumbar hnp without myelopathy, Lumbar spondylosis, Lumbar degenerative disc disease, Lumbar radiculopathy L5/S1 Left  POST-OPERATIVE DIAGNOSIS: Lumbar hnp without myelopathy, Lumbar spondylosis, Lumbar degenerative disc disease, Lumbar radiculopathy L5/S1 Left  PROCEDURE:  Procedure(s) (LRB): LUMBAR LAMINECTOMY/DECOMPRESSION MICRODISCECTOMY 1 LEVEL (Left) L5/S1 with microdissection  SURGEON:  Surgeon(s) and Role:    * Maeola Harman, MD - Primary    * Karn Cassis, MD - Assisting  PHYSICIAN ASSISTANT:   ASSISTANTS: Poteat, RN   ANESTHESIA:   general  EBL:  Total I/O In: 600 [I.V.:600] Out: -   BLOOD ADMINISTERED:none  DRAINS: none   LOCAL MEDICATIONS USED:  LIDOCAINE   SPECIMEN:  No Specimen  DISPOSITION OF SPECIMEN:  N/A  COUNTS:  YES  TOURNIQUET:  * No tourniquets in log *  DICTATION: DICTATION: Patient has a large L5/S1disc rupture on the left with significant leftt leg weakness and pain. It was elected to take her to surgery for leftt L5/S1 microdiscectomy.  Procedure: Patient was brought to the operating room and following the smooth and uncomplicated induction of general endotracheal anesthesia she was placed in a prone position on the Wilson frame. Low back was prepped and draped in the usual sterile fashion with DuraPrep. Area of planned incision was infiltrated with local lidocaine. Incision was made in the midline and carried to the lumbodorsal fascia which was incised on the left side of midline. Subperiosteal dissection was performed exposing what was felt to be L5/S1 level. Intraoperative x-ray demonstrated marker probeat the correct level. A hemi-semi-laminectomy of L5 was performed a high-speed drill and completed with Kerrison rongeurs and a generous foraminotomy was performed overlying the superior aspect of the S1 lamina. Ligamentum flavum was detached and  removed in a piecemeal fashion and the S1 nerve root was decompressed laterally with removal of the superior aspect of the facet and ligamentum causing nerve root compression. The microscope was brought into the field and the S1 nerve root was mobilized medially. This exposed a large amount of soft disc material and a free fragment of herniated disc material. Multiple fragments were removed. Ball dissector probes were passed along the floor of the spinal canal without difficulty.  At this point it was felt that all neural elements were well decompressed and there was no evidence of residual loose disc material. It was elected not to incise the interspace. Hemostasis was assured with bipolar electrocautery and the interspace was irrigated with Depo-Medrol and fentanyl. The lumbodorsal fascia was closed with 0 Vicryl sutures the subcutaneous tissues reapproximated 2-0 Vicryl inverted sutures and the skin edges were reapproximated with 3-0 Vicryl subcuticular stitch. The wound is dressed with Dermabond. Patient was extubated in the operating room and taken to recovery in stable and satisfactory condition having tolerated his operation well counts were correct at the end of the case.  PLAN OF CARE: Admit for overnight observation  PATIENT DISPOSITION:  PACU - hemodynamically stable.   Delay start of Pharmacological VTE agent (>24hrs) due to surgical blood loss or risk of bleeding: yes

## 2012-04-17 NOTE — Discharge Summary (Signed)
Physician Discharge Summary  Patient ID: Victoria [name] MRN: [id number] DOB/AGE: 71-Feb-1942 71 y.o.  Admit date: 04/17/2012 Discharge date: 04/17/2012  Admission Diagnoses:Left L5/S1 disc herniation with radiculopathy  Discharge Diagnoses: Same Active Problems:  * No active hospital problems. *    Discharged Condition: good  Hospital Course: Uncomplicated Left L5/S1 microdiscectomy  Consults: None  Significant Diagnostic Studies: None  Treatments: surgery: Left L5/S1 microdiscectomy  Discharge Exam: Blood pressure 158/74, pulse 76, temperature 97.2 F (36.2 C), temperature source Oral, resp. rate 11, SpO2 100.00%. Neurologic: Alert and oriented X 3, normal strength and tone. Normal symmetric reflexes. Normal coordination and gait Wound:CDI  Disposition: Home   Medication List  As of 04/17/2012  5:17 PM   ASK your doctor about these medications         aspirin 81 MG tablet   Take 81 mg by mouth daily.      BENADRYL 25 mg capsule   Generic drug: diphenhydrAMINE   Take 25 mg by mouth at bedtime.      Calcium Carb-Cholecalciferol 600-400 MG-UNIT Tabs   Take 1 tablet by mouth 2 (two) times daily.      clobetasol 0.05 % external solution   Commonly known as: TEMOVATE   Apply 1 application topically 2 (two) times daily.      Coenzyme Q10 400 MG Caps   Take 1 capsule by mouth daily.      desoximetasone 0.25 % cream   Commonly known as: TOPICORT   Apply 1 application topically 2 (two) times daily as needed. For itching      diazepam 5 MG tablet   Commonly known as: VALIUM   Take 5 mg by mouth every 12 (twelve) hours as needed.      diclofenac 75 MG EC tablet   Commonly known as: VOLTAREN   Take 75 mg by mouth 2 (two) times daily.      Eye Vitamins Tabs   Take 1 tablet by mouth daily.      ezetimibe 10 MG tablet   Commonly known as: ZETIA   Take 1 tablet (10 mg total) by mouth daily.      levothyroxine 100 MCG tablet   Commonly known as: SYNTHROID,  LEVOTHROID   Take 1 tablet (100 mcg total) by mouth daily.      nitroGLYCERIN 0.4 MG SL tablet   Commonly known as: NITROSTAT   Place 0.4 mg under the tongue every 5 (five) minutes as needed. For chest pain      oxyCODONE-acetaminophen 5-325 MG per tablet   Commonly known as: PERCOCET/ROXICET   Take 1 tablet by mouth every 4 (four) hours as needed for pain.             Signed: Dorian Heckle, MD 04/17/2012, 5:17 PM

## 2012-04-17 NOTE — Progress Notes (Signed)
Patient doing well.  Minimal back pain.  D/C home if ambulating well.

## 2012-04-17 NOTE — Progress Notes (Signed)
Pt walked through the unit with me, no pain, no numbness. Pt has been stressed to get out from here. She has been little hypertensive with stress. No other concerns, took her down in a wheel chair.

## 2012-04-17 NOTE — Preoperative (Signed)
Beta Blockers   Reason not to administer Beta Blockers:Not Applicable 

## 2012-04-17 NOTE — Progress Notes (Signed)
PHARMACIST - PHYSICIAN ORDER COMMUNICATION  CONCERNING: P&T Medication Policy on Herbal Medications  DESCRIPTION:  This patient's order for:  Coenzyme Q10 has been noted.  This product(s) is classified as an "herbal" or natural product. Due to a lack of definitive safety studies or FDA approval, nonstandard manufacturing practices, plus the potential risk of unknown drug-drug interactions while on inpatient medications, the Pharmacy and Therapeutics Committee does not permit the use of "herbal" or natural products of this type within [facility].   ACTION TAKEN: The pharmacy department is unable to verify this order at this time and your patient has been informed of this safety policy. Please reevaluate patient's clinical condition at discharge and address if the herbal or natural product(s) should be resumed at that time.   Juliette Alcide, PharmD, BCPS.  Pager: [phone] 04/17/2012 6:27 PM

## 2012-04-17 NOTE — Transfer of Care (Signed)
Immediate Anesthesia Transfer of Care Note  Patient: Victoria [name]  Procedure(s) Performed: Procedure(s) (LRB): LUMBAR LAMINECTOMY/DECOMPRESSION MICRODISCECTOMY 1 LEVEL (Left)  Patient Location: PACU  Anesthesia Type: General  Level of Consciousness: awake  Airway & Oxygen Therapy: Patient Spontanous Breathing and Patient connected to nasal cannula oxygen  Post-op Assessment: Report given to PACU RN, Post -op Vital signs reviewed and stable and Patient moving all extremities  Post vital signs: Reviewed and stable  Complications: No apparent anesthesia complications

## 2012-04-17 NOTE — Anesthesia Postprocedure Evaluation (Signed)
  Anesthesia Post-op Note  Patient: Victoria [name]  Procedure(s) Performed: Procedure(s) (LRB): LUMBAR LAMINECTOMY/DECOMPRESSION MICRODISCECTOMY 1 LEVEL (Left)  Patient Location: PACU  Anesthesia Type: General  Level of Consciousness: awake, alert  and oriented  Airway and Oxygen Therapy: Patient Spontanous Breathing and Patient connected to nasal cannula oxygen  Post-op Pain: mild  Post-op Assessment: Post-op Vital signs reviewed and Patient's Cardiovascular Status Stable  Post-op Vital Signs: stable  Complications: No apparent anesthesia complications

## 2012-04-18 ENCOUNTER — Encounter (HOSPITAL_COMMUNITY): Payer: Self-pay | Admitting: Neurosurgery

## 2012-05-07 ENCOUNTER — Other Ambulatory Visit: Payer: Self-pay | Admitting: Cardiovascular Disease

## 2012-06-08 ENCOUNTER — Ambulatory Visit (INDEPENDENT_AMBULATORY_CARE_PROVIDER_SITE_OTHER): Payer: Medicare Other | Admitting: Cardiovascular Disease

## 2012-06-08 ENCOUNTER — Encounter: Payer: Self-pay | Admitting: Cardiovascular Disease

## 2012-06-08 VITALS — BP 148/90 | HR 77 | Ht 63.0 in | Wt 143.8 lb

## 2012-06-08 DIAGNOSIS — I2510 Atherosclerotic heart disease of native coronary artery without angina pectoris: Secondary | ICD-10-CM | POA: Diagnosis not present

## 2012-06-08 DIAGNOSIS — I10 Essential (primary) hypertension: Secondary | ICD-10-CM | POA: Diagnosis not present

## 2012-06-08 DIAGNOSIS — E785 Hyperlipidemia, unspecified: Secondary | ICD-10-CM

## 2012-06-08 MED ORDER — NITROGLYCERIN 0.4 MG SL SUBL: 0.4000 mg | SUBLINGUAL_TABLET | SUBLINGUAL | Status: DC | PRN

## 2012-06-08 NOTE — Assessment & Plan Note (Signed)
Will repeat lipids and LFTs in February 2014. If she has lab work with Dr. Felicity Coyer her lipids could be drawn at that time as well.

## 2012-06-08 NOTE — Assessment & Plan Note (Signed)
The patient has white coat hypertension. Her home blood pressures are within normal limits

## 2012-06-08 NOTE — Progress Notes (Signed)
HPI:  71 year old woman presenting for followup evaluation. The patient has coronary artery disease. She presented with non-ST elevation infarction 2009 and was noted to have small vessel disease involving the branch of the left circumflex. She was treated with balloon angioplasty. She was noted to have preserved left ventricular function. Her last functional study was a stress echocardiogram in August 2012 and this showed no ischemic changes.  The patient is doing very well. She feels much better off of her statin drug and feels that cognitively she is much better. Her Synthroid dose has been lowered and she thinks this has helped a lot as well. She has occasional sharp chest pains but no exertional symptoms at present. She does not have dyspnea, edema, or palpitations. She's had a lumbar microdiscectomy since I have seen her and she did not have any perioperative problems.  Outpatient Encounter Prescriptions as of 06/08/2012  Medication Sig Dispense Refill  . aspirin 81 MG tablet Take 81 mg by mouth daily.        . Calcium Carb-Cholecalciferol 600-400 MG-UNIT TABS Take 1 tablet by mouth 2 (two) times daily.        . clobetasol (TEMOVATE) 0.05 % external solution Apply 1 application topically 2 (two) times daily.  50 mL  0  . Coenzyme Q10 400 MG CAPS Take 1 capsule by mouth daily.        Marland Kitchen desoximetasone (TOPICORT) 0.25 % cream Apply 1 application topically 2 (two) times daily as needed. For itching      . diphenhydrAMINE (BENADRYL) 25 mg capsule Take 25 mg by mouth at bedtime.        Marland Kitchen levothyroxine (SYNTHROID, LEVOTHROID) 100 MCG tablet Take 1 tablet (100 mcg total) by mouth daily.  90 tablet  1  . Multiple Vitamins-Minerals (EYE VITAMINS) TABS Take 1 tablet by mouth daily.        . nitroGLYCERIN (NITROSTAT) 0.4 MG SL tablet Place 0.4 mg under the tongue every 5 (five) minutes as needed. For chest pain      . ZETIA 10 MG tablet TAKE 1 TABLET BY MOUTH DAILY.  30 tablet  9  . DISCONTD: diclofenac  (VOLTAREN) 75 MG EC tablet Take 75 mg by mouth 2 (two) times daily.        Allergies  Allergen Reactions  . Lanolin Itching  . Latex Other (See Comments)    Unknown allergy testing showed allergic  . Shellfish Allergy Hives  . Cephalexin Itching and Rash  . Ciprofloxacin Rash  . Erythromycin Itching and Rash  . Penicillins Itching and Rash  . Propylene Glycol Itching and Rash  . Tetracycline Itching and Rash  . Tetracyclines & Related Itching and Rash    Past Medical History  Diagnosis Date  . CAD (coronary artery disease)     unspecified site with NSTEMI 2009. Small OM treated with PCA  . Atopic eczema   . Osteoporosis     hx tailbone fx; bisphos >25yr tx, stopped 2011  . Hyperlipidemia     intol statin  . Hypothyroidism   . Essential hypertension, benign     white coat     dr Jamil Castillo  . Myocardial infarction     2009    ROS: Negative except as per HPI  BP 148/90  Pulse 77  Ht 5\' 3"  (1.6 m)  Wt 65.227 kg (143 lb 12.8 oz)  BMI 25.47 kg/m2  PHYSICAL EXAM: Pt is alert and oriented, NAD HEENT: normal Neck: JVP - normal, carotids  2+= without bruits Lungs: CTA bilaterally CV: RRR without murmur or gallop Abd: soft, NT, Positive BS, no hepatomegaly Ext: no C/C/E, distal pulses intact and equal Skin: warm/dry no rash  EKG:  Normal sinus rhythm 77 beats per minute, left axis deviation, otherwise within normal limits the  ASSESSMENT AND PLAN:

## 2012-06-08 NOTE — Assessment & Plan Note (Signed)
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 Will continue her current medical program. I would like to see her back in 12 months for followup.

## 2012-06-08 NOTE — Patient Instructions (Addendum)
Your physician recommends that you continue on your current medications as directed. Please refer to the Current Medication list given to you today.  A refill on your Nitroglycerin has been sent in today.   Your physician recommends that you return for fasting lab work in: February 2014  Your physician wants you to follow-up in: 1 year with Dr. Excell Seltzer.  You will receive a reminder letter in the mail two months in advance. If you don't receive a letter, please call our office to schedule the follow-up appointment.

## 2012-08-03 DIAGNOSIS — H43399 Other vitreous opacities, unspecified eye: Secondary | ICD-10-CM | POA: Diagnosis not present

## 2012-08-03 DIAGNOSIS — H40019 Open angle with borderline findings, low risk, unspecified eye: Secondary | ICD-10-CM | POA: Diagnosis not present

## 2012-08-03 DIAGNOSIS — H2510 Age-related nuclear cataract, unspecified eye: Secondary | ICD-10-CM | POA: Diagnosis not present

## 2012-08-03 DIAGNOSIS — H353190 Nonexudative age-related macular degeneration, unspecified eye, stage unspecified: Secondary | ICD-10-CM | POA: Diagnosis not present

## 2012-08-23 ENCOUNTER — Encounter: Payer: Self-pay | Admitting: Internal Medicine

## 2012-08-23 DIAGNOSIS — Z23 Encounter for immunization: Secondary | ICD-10-CM | POA: Diagnosis not present

## 2012-09-07 ENCOUNTER — Other Ambulatory Visit: Payer: Self-pay | Admitting: Internal Medicine

## 2012-11-23 ENCOUNTER — Other Ambulatory Visit: Payer: Self-pay | Admitting: Internal Medicine

## 2013-01-07 ENCOUNTER — Ambulatory Visit (INDEPENDENT_AMBULATORY_CARE_PROVIDER_SITE_OTHER): Payer: Medicare Other | Admitting: Internal Medicine

## 2013-01-07 ENCOUNTER — Other Ambulatory Visit (INDEPENDENT_AMBULATORY_CARE_PROVIDER_SITE_OTHER): Payer: Medicare Other

## 2013-01-07 ENCOUNTER — Encounter: Payer: Self-pay | Admitting: Internal Medicine

## 2013-01-07 VITALS — BP 134/72 | HR 93 | Temp 97.4°F | Ht 63.0 in | Wt 145.2 lb

## 2013-01-07 DIAGNOSIS — I2510 Atherosclerotic heart disease of native coronary artery without angina pectoris: Secondary | ICD-10-CM | POA: Diagnosis not present

## 2013-01-07 DIAGNOSIS — E039 Hypothyroidism, unspecified: Secondary | ICD-10-CM

## 2013-01-07 DIAGNOSIS — M810 Age-related osteoporosis without current pathological fracture: Secondary | ICD-10-CM | POA: Diagnosis not present

## 2013-01-07 DIAGNOSIS — Z1211 Encounter for screening for malignant neoplasm of colon: Secondary | ICD-10-CM

## 2013-01-07 DIAGNOSIS — Z23 Encounter for immunization: Secondary | ICD-10-CM | POA: Diagnosis not present

## 2013-01-07 DIAGNOSIS — Z0000 Encounter for general adult medical examination without abnormal findings: Secondary | ICD-10-CM | POA: Diagnosis not present

## 2013-01-07 LAB — LIPID PANEL: VLDL: 17.4 mg/dL (ref 0.0–40.0)

## 2013-01-07 LAB — TSH: TSH: 0.07 u[IU]/mL — ABNORMAL LOW (ref 0.35–5.50)

## 2013-01-07 MED ORDER — LEVOTHYROXINE SODIUM 100 MCG PO TABS: 100.0000 ug | ORAL_TABLET | Freq: Every day | ORAL | Status: DC

## 2013-01-07 MED ORDER — CLOBETASOL PROPIONATE 0.05 % EX SOLN: Freq: Two times a day (BID) | CUTANEOUS | Status: DC

## 2013-01-07 NOTE — Assessment & Plan Note (Signed)
Lab Results  Component Value Date   TSH 0.03* 03/12/2012  reduced synthroid 125>>112 on 11/2011 Recheck now

## 2013-01-07 NOTE — Patient Instructions (Signed)
It was good to see you today. We have reviewed your prior records including labs and tests today Health Maintenance reviewed - tetanus booster updated today- all other recommended immunizations and age-appropriate screenings are up-to-date. Test(s) ordered today. Your results will be released to MyChart (or called to you) after review, usually within 72hours after test completion. If any changes need to be made, you will be notified at that same time. Medications reviewed and updated, no changes at this time. we'll make referral to Dr Matthias Hughs for your colonoscopy screening. Our office will contact you regarding appointment(s) once made. Please schedule followup in 12 months for annual labs and review, call sooner if problems.  Health Maintenance, Females A healthy lifestyle and preventative care can promote health and wellness.  Maintain regular health, dental, and eye exams.  Eat a healthy diet. Foods like vegetables, fruits, whole grains, low-fat dairy products, and lean protein foods contain the nutrients you need without too many calories. Decrease your intake of foods high in solid fats, added sugars, and salt. Get information about a proper diet from your caregiver, if necessary.  Regular physical exercise is one of the most important things you can do for your health. Most adults should get at least 150 minutes of moderate-intensity exercise (any activity that increases your heart rate and causes you to sweat) each week. In addition, most adults need muscle-strengthening exercises on 2 or more days a week.   Maintain a healthy weight. The body mass index (BMI) is a screening tool to identify possible weight problems. It provides an estimate of body fat based on height and weight. Your caregiver can help determine your BMI, and can help you achieve or maintain a healthy weight. For adults 20 years and older:  A BMI below 18.5 is considered underweight.  A BMI of 18.5 to 24.9 is  normal.  A BMI of 25 to 29.9 is considered overweight.  A BMI of 30 and above is considered obese.  Maintain normal blood lipids and cholesterol by exercising and minimizing your intake of saturated fat. Eat a balanced diet with plenty of fruits and vegetables. Blood tests for lipids and cholesterol should begin at age 14 and be repeated every 5 years. If your lipid or cholesterol levels are high, you are over 50, or you are a high risk for heart disease, you may need your cholesterol levels checked more frequently.Ongoing high lipid and cholesterol levels should be treated with medicines if diet and exercise are not effective.  If you smoke, find out from your caregiver how to quit. If you do not use tobacco, do not start.  If you are pregnant, do not drink alcohol. If you are breastfeeding, be very cautious about drinking alcohol. If you are not pregnant and choose to drink alcohol, do not exceed 1 drink per day. One drink is considered to be 12 ounces (355 mL) of beer, 5 ounces (148 mL) of wine, or 1.5 ounces (44 mL) of liquor.  Avoid use of street drugs. Do not share needles with anyone. Ask for help if you need support or instructions about stopping the use of drugs.  High blood pressure causes heart disease and increases the risk of stroke. Blood pressure should be checked at least every 1 to 2 years. Ongoing high blood pressure should be treated with medicines, if weight loss and exercise are not effective.  If you are 28 to 72 years old, ask your caregiver if you should take aspirin to prevent strokes.  Diabetes screening involves taking a blood sample to check your fasting blood sugar level. This should be done once every 3 years, after age 21, if you are within normal weight and without risk factors for diabetes. Testing should be considered at a younger age or be carried out more frequently if you are overweight and have at least 1 risk factor for diabetes.  Breast cancer screening is  essential preventative care for women. You should practice "breast self-awareness." This means understanding the normal appearance and feel of your breasts and may include breast self-examination. Any changes detected, no matter how small, should be reported to a caregiver. Women in their 68s and 30s should have a clinical breast exam (CBE) by a caregiver as part of a regular health exam every 1 to 3 years. After age 54, women should have a CBE every year. Starting at age 66, women should consider having a mammogram (breast X-ray) every year. Women who have a family history of breast cancer should talk to their caregiver about genetic screening. Women at a high risk of breast cancer should talk to their caregiver about having an MRI and a mammogram every year.  The Pap test is a screening test for cervical cancer. Women should have a Pap test starting at age 13. Between ages 82 and 74, Pap tests should be repeated every 2 years. Beginning at age 28, you should have a Pap test every 3 years as long as the past 3 Pap tests have been normal. If you had a hysterectomy for a problem that was not cancer or a condition that could lead to cancer, then you no longer need Pap tests. If you are between ages 69 and 69, and you have had normal Pap tests going back 10 years, you no longer need Pap tests. If you have had past treatment for cervical cancer or a condition that could lead to cancer, you need Pap tests and screening for cancer for at least 20 years after your treatment. If Pap tests have been discontinued, risk factors (such as a new sexual partner) need to be reassessed to determine if screening should be resumed. Some women have medical problems that increase the chance of getting cervical cancer. In these cases, your caregiver may recommend more frequent screening and Pap tests.  The human papillomavirus (HPV) test is an additional test that may be used for cervical cancer screening. The HPV test looks for the  virus that can cause the cell changes on the cervix. The cells collected during the Pap test can be tested for HPV. The HPV test could be used to screen women aged 69 years and older, and should be used in women of any age who have unclear Pap test results. After the age of 22, women should have HPV testing at the same frequency as a Pap test.  Colorectal cancer can be detected and often prevented. Most routine colorectal cancer screening begins at the age of 34 and continues through age 39. However, your caregiver may recommend screening at an earlier age if you have risk factors for colon cancer. On a yearly basis, your caregiver may provide home test kits to check for hidden blood in the stool. Use of a small camera at the end of a tube, to directly examine the colon (sigmoidoscopy or colonoscopy), can detect the earliest forms of colorectal cancer. Talk to your caregiver about this at age 35, when routine screening begins. Direct examination of the colon should be repeated every 5  to 10 years through age 70, unless early forms of pre-cancerous polyps or small growths are found.  Hepatitis C blood testing is recommended for all people born from [birth year] through 1965 and any individual with known risks for hepatitis C.  Practice safe sex. Use condoms and avoid high-risk sexual practices to reduce the spread of sexually transmitted infections (STIs). Sexually active women aged 11 and younger should be checked for Chlamydia, which is a common sexually transmitted infection. Older women with new or multiple partners should also be tested for Chlamydia. Testing for other STIs is recommended if you are sexually active and at increased risk.  Osteoporosis is a disease in which the bones lose minerals and strength with aging. This can result in serious bone fractures. The risk of osteoporosis can be identified using a bone density scan. Women ages 20 and over and women at risk for fractures or osteoporosis should  discuss screening with their caregivers. Ask your caregiver whether you should be taking a calcium supplement or vitamin D to reduce the rate of osteoporosis.  Menopause can be associated with physical symptoms and risks. Hormone replacement therapy is available to decrease symptoms and risks. You should talk to your caregiver about whether hormone replacement therapy is right for you.  Use sunscreen with a sun protection factor (SPF) of 30 or greater. Apply sunscreen liberally and repeatedly throughout the day. You should seek shade when your shadow is shorter than you. Protect yourself by wearing long sleeves, pants, a wide-brimmed hat, and sunglasses year round, whenever you are outdoors.  Notify your caregiver of new moles or changes in moles, especially if there is a change in shape or color. Also notify your caregiver if a mole is larger than the size of a pencil eraser.  Stay current with your immunizations. Document Released: 04/04/2011 Document Revised: 12/12/2011 Document Reviewed: 04/04/2011 [facility] Patient Information 2013 [place], [place]. Osteoporosis and Fractures Osteoporosis is a disease of the bones that makes them weaker and prone to break (fracture). By their mid-30s, most people begin to gradually lose bone strength. If this is severe enough, osteoporosis may occur. Bone fractures from osteoporosis (especially hip and spine fractures) are a major cause of hospitalization, loss of independence, and can lead to life-threatening complications. Your goals are to prevent falls and to prevent or treat weakening bones. ROLE OF FALLS Fractures occur in only a small percentage of older persons who fall, even though at least 30% to 40% of elderly people have osteoporosis. However, a fall of just a few feet may cause a fracture in a person with low bone density. FALL PREVENTION  If you are unsteady on your feet, use a cane, walker, or walk with someone's help.   Remove loose rugs or  electrical cords from your home.   Keep your home well lit at night. Use glasses if you need them.   Avoid icy streets and wet or waxed floors.   Hold the railing when using stairs.   Watch out for your pets.   Install grab bars in your bathroom.   Exercise. Physical activity, especially weight-bearing exercise, helps strengthen bones. Strength and balance exercise, such as tai chi, helps prevent falls.   Alcohol and some medicines can make you more likely to fall. Discuss alcohol use with your caregiver. Ask your caregiver if any of your medicines might increase your risk for falling. Ask if safer alternatives are available.  HOME CARE INSTRUCTIONS   Try to prevent and avoid falls.  To pick up objects, bend at the knees. Do not bend with your back.   Do not smoke. If you smoke, ask for help to stop.   Have adequate calcium and vitamin D in your diet. Talk with your caregiver about amounts.   Before exercising, ask your caregiver what exercises will be good for you.   Only take over-the-counter or prescription medicines for pain, discomfort, or fever as directed by your caregiver.   Some medical conditions (menopause, kidney disease, steroid treatment) can weaken your bones. Talk about bone health with your caregiver. Discuss any medicines you take and whether they can weaken your bones.   If you have osteoporosis or osteopenia, discuss treatment options with your caregiver.  SEEK MEDICAL CARE IF:   You have had a fracture and your pain is not controlled.   You have had a fracture and you are not able to return to activities as expected.   You are reinjured.   You develop side effects from medicines, especially stomach pain or trouble swallowing.   You develop new, unexplained problems.  SEEK IMMEDIATE MEDICAL CARE IF:   You develop sudden, severe pain in your back.   You develop pain after an injury or fall.  Document Released: 08/12/2004 Document Revised: 09/08/2011  Document Reviewed: 01/20/2010 [facility] Patient Information 2012 [place], [place].

## 2013-01-07 NOTE — Assessment & Plan Note (Signed)
Last DEXA reviewed 04/2009: -2.8 L fem - also hx coccyx fx On fosamax for >54yr - took drug holiday and stopped med summer 2011 due to concern for brittle bone recheck DEXA 11/2011 - no significant change declines Prolia - reconsider fosamax if future decline Reviewed need for adequate Vit D and Ca intake - check vit D now

## 2013-01-07 NOTE — Assessment & Plan Note (Signed)
Status post balloon angioplasty to OM 2009 following NSTEMI Intolerant of statins, no anginal symptoms Follows with cardiology annually The current medical regimen is effective;  continue present plan and medications.

## 2013-01-07 NOTE — Progress Notes (Signed)
Subjective:    Patient ID: Victoria [name], female    DOB: 1941/03/19, 72 y.o.   MRN: [id number]  HPI  Here for medicare wellness  Diet: heart healthy  Physical activity: active Depression/mood screen: negative Hearing: intact to whispered voice Visual acuity: grossly normal, performs annual eye exam  ADLs: capable Fall risk: none Home safety: good Cognitive evaluation: intact to orientation, naming, recall and repetition EOL planning: adv directives, full code/ I agree  I have personally reviewed and have noted 1. The patient's medical and social history 2. Their use of alcohol, tobacco or illicit drugs 3. Their current medications and supplements 4. The patient's functional ability including ADL's, fall risks, home safety risks and hearing or visual impairment. 5. Diet and physical activities 6. Evidence for depression or mood disorders   Also reviewed chronic medical issues:  Osteoporosis - history of coccyx fracture, on bisphosphonate for greater than 10 years, stopped in 2011 due to concern for possible brittle bone side effects - involves weightbearing exercise and takes calcium plus vitamin D regularly - last DEXA 12/2011 reviewed, not interested in Prolia or fosamax  Hypothyroid - recent labs reviewed - no recent dose changes in Synthroid replacement - denies weight changes, fatigue bowel changes or skin/hair changes  Dyslipidemia. Intolerant of statins. Takes zetia and fish oil for same. Follows annually with cardiology and follows a low-fat diet with regular exercise as tolerated  Whitecoat hypertension - never on prescription medications for same  Past Medical History  Diagnosis Date  . CAD (coronary artery disease)     unspecified site with NSTEMI 2009. Small OM treated with PCA  . Atopic eczema   . Osteoporosis     hx tailbone fx; bisphos >26yr tx, stopped 2011  . Hyperlipidemia     intol statin  . Hypothyroidism   . Essential hypertension, benign      white coat     dr cooper  . Myocardial infarction     2009  . Acute lumbar radiculopathy 04/2012    L L5-S1 s/p decompression surg   Family History  Problem Relation Age of Onset  . Colon cancer Mother 80    1st degree  . Stroke Father 68  . Stroke      Grandfather- grandmother    History  Substance Use Topics  . Smoking status: Never Smoker   . Smokeless tobacco: Not on file  . Alcohol Use: 0.0 oz/week     Comment: 1 glass of wine daily   Review of Systems  Constitutional: Positive for fatigue. Negative for fever and unexpected weight change.  Respiratory: Negative for cough and shortness of breath.   Cardiovascular: Negative for chest pain and leg swelling.  Gastrointestinal: Negative for abdominal pain, no bowel changes.  Musculoskeletal: Negative for gait problem or joint swelling.  Skin: Negative for rash.  Neurological: Negative for dizziness or headache.  No other specific complaints in a complete review of systems (except as listed in HPI above).      Objective:   Physical Exam  BP 134/72  Pulse 93  Temp(Src) 97.4 F (36.3 C) (Oral)  Ht 5\' 3"  (1.6 m)  Wt 145 lb 4 oz (65.885 kg)  BMI 25.74 kg/m2  SpO2 96% Wt Readings from Last 3 Encounters:  01/07/13 145 lb 4 oz (65.885 kg)  06/08/12 143 lb 12.8 oz (65.227 kg)  04/16/12 146 lb 6.4 oz (66.407 kg)   Constitutional: She appears well-developed and well-nourished. No distress.  Neck: Normal range of motion.  Neck supple. No JVD present. No thyromegaly present.  Cardiovascular: Normal rate, regular rhythm and normal heart sounds.  No murmur heard. No BLE edema. Pulmonary/Chest: Effort normal and breath sounds normal. No respiratory distress. She has no wheezes.  MSkel: Back: full range of motion of thoracic and lumbar spine. Non tender to palpation. Negative straight leg raise. DTR's are symmetrically intact. Sensation intact in all dermatomes of the lower extremities. Full strength to manual muscle testing.  patient is able to heel toe walk without difficulty and ambulates with antalgic gait. Neurological: She is alert and oriented to person, place, and time. No cranial nerve deficit. Coordination, gait, balance, speech and recall are normal.  Psychiatric: She has a normal mood and affect. Her behavior is normal. Judgment and thought content normal.   Lab Results  Component Value Date   WBC 7.5 04/16/2012   HGB 14.1 04/16/2012   HCT 41.4 04/16/2012   PLT 251 04/16/2012   GLUCOSE 87 04/16/2012   CHOL 192 11/04/2011   TRIG 107.0 11/04/2011   HDL 44.60 11/04/2011   LDLCALC 126* 11/04/2011   ALT 17 11/04/2011   AST 17 11/04/2011   NA 141 04/16/2012   K 3.9 04/16/2012   CL 105 04/16/2012   CREATININE 0.74 04/16/2012   BUN 13 04/16/2012   CO2 27 04/16/2012   TSH 0.03* 03/12/2012   INR 0.89 04/07/2012   HGBA1C  Value: 5.4 (NOTE)   The ADA recommends the following therapeutic goals for glycemic   control related to Hgb A1C measurement:   Goal of Therapy:   < 7.0% Hgb A1C   Action Suggested:  > 8.0% Hgb A1C   Ref:  Diabetes Care, 22, Suppl. 1, 1999 11/10/2007       Assessment & Plan:  AWV/CPX/v70.0 - Today patient counseled on age appropriate routine health concerns for screening and prevention, each reviewed and up to date or declined. Immunizations reviewed and up to date or declined. Labs/ECG reviewed. Risk factors for depression reviewed and negative. Hearing function and visual acuity are intact. ADLs screened and addressed as needed. Functional ability and level of safety reviewed and appropriate. Education, counseling and referrals performed based on assessed risks today. Patient provided with a copy of personalized plan for preventive services.  Also see problem list. Medications and labs reviewed today.

## 2013-02-23 ENCOUNTER — Other Ambulatory Visit: Payer: Self-pay | Admitting: Cardiovascular Disease

## 2013-06-04 ENCOUNTER — Other Ambulatory Visit: Payer: Self-pay | Admitting: Internal Medicine

## 2013-08-08 ENCOUNTER — Other Ambulatory Visit: Payer: Self-pay

## 2013-10-06 DIAGNOSIS — Z23 Encounter for immunization: Secondary | ICD-10-CM | POA: Diagnosis not present

## 2013-11-08 ENCOUNTER — Encounter: Payer: Self-pay | Admitting: Nurse Practitioner

## 2013-11-08 ENCOUNTER — Ambulatory Visit
Admission: RE | Admit: 2013-11-08 | Discharge: 2013-11-08 | Disposition: A | Discharge status: Home / Self Care | Payer: Medicare Other | Source: Ambulatory Visit | Attending: Nurse Practitioner | Admitting: Nurse Practitioner

## 2013-11-08 ENCOUNTER — Ambulatory Visit (INDEPENDENT_AMBULATORY_CARE_PROVIDER_SITE_OTHER): Payer: Medicare Other | Admitting: Nurse Practitioner

## 2013-11-08 ENCOUNTER — Telehealth: Payer: Self-pay | Admitting: Cardiovascular Disease

## 2013-11-08 ENCOUNTER — Other Ambulatory Visit: Payer: Self-pay | Admitting: *Deleted

## 2013-11-08 VITALS — BP 130/78 | HR 92 | Ht 62.0 in | Wt 148.8 lb

## 2013-11-08 DIAGNOSIS — R079 Chest pain, unspecified: Secondary | ICD-10-CM

## 2013-11-08 DIAGNOSIS — I2510 Atherosclerotic heart disease of native coronary artery without angina pectoris: Secondary | ICD-10-CM

## 2013-11-08 DIAGNOSIS — I252 Old myocardial infarction: Secondary | ICD-10-CM

## 2013-11-08 DIAGNOSIS — R0602 Shortness of breath: Secondary | ICD-10-CM | POA: Diagnosis not present

## 2013-11-08 MED ORDER — EZETIMIBE 10 MG PO TABS: ORAL_TABLET | ORAL | Status: DC

## 2013-11-08 MED ORDER — METOPROLOL TARTRATE 25 MG PO TABS: 25.0000 mg | ORAL_TABLET | Freq: Two times a day (BID) | ORAL | Status: DC

## 2013-11-08 MED ORDER — NITROGLYCERIN 0.4 MG SL SUBL: 0.4000 mg | SUBLINGUAL_TABLET | SUBLINGUAL | Status: DC | PRN

## 2013-11-08 MED ORDER — PREDNISONE 20 MG PO TABS: ORAL_TABLET | ORAL | Status: DC

## 2013-11-08 MED ORDER — PREDNISONE 20 MG PO TABS: 20.0000 mg | ORAL_TABLET | ORAL | Status: DC

## 2013-11-08 NOTE — Telephone Encounter (Signed)
PT  AWARE   THAT WILL  NEED TO  TAKE  PREDNISONE  AS  WELL AS  BENADRYL  DIRECTIONS   ARE ON   PRESCRIPTION .Adonis Housekeeper

## 2013-11-08 NOTE — Progress Notes (Signed)
Victoria [name] Date of Birth: 07/06/1941 Medical Record [id number]  History of Present Illness: Victoria [name] is seen back today for a follow up visit. Seen for Dr. Burt Knack. She is a 73 year old female with known CAD - she had NSTEMI in 2009 and noted to have small vessel disease involving the branch of the LCX. She did have balloon PCI at that time. Last functional study back in August of 2012 showed no ischemia. Other issues include hypothyroidism, HLD with statin intolerance - on Zetia, osteoporosis and HTN - with white coat syndrome.   Last seen in September of 2013 - seemed to be doing well.   Comes back today. Here alone. Did not get her recall letter for 2014 visit for Dr. Burt Knack. She is here because she has not felt well since November. Something "is not right'. She has had headaches. She is profoundly fatigued - now having to take a nap which is very unusual for her. Having chest tightness - every couple of days. Has had spells where she has woken up at night with her left arm hurting. She has had some indigestion off and on and her diet has not changed. Her chest tightness is a similar to her prior chest pain syndrome. She has used NTG on occasion with prompt relief but gets a terrible headache. Usually taking aspirin for her symptoms. She had stopped exercising but tried to get on her treadmill earlier this week and had to stop due to discomfort. Noted today some vision issue where "the molding on the door was vibrating". She has had some associated dyspnea.   Current Outpatient Prescriptions  Medication Sig Dispense Refill  . aspirin 81 MG tablet Take 81 mg by mouth daily.        . Calcium Carb-Cholecalciferol 600-400 MG-UNIT TABS Take 1 tablet by mouth 2 (two) times daily.        . clobetasol (TEMOVATE) 0.05 % external solution APPLY TO THE AFFECTED TOPICAL AREA 2 TIMES DAILY.  50 mL  0  . Coenzyme Q10 (CO Q-10) 300 MG CAPS Take 1 capsule by mouth daily.      Marland Kitchen desoximetasone  (TOPICORT) 0.25 % cream Apply 1 application topically 2 (two) times daily as needed. For itching      . diphenhydrAMINE (BENADRYL) 25 mg capsule Take 25 mg by mouth every 6 (six) hours as needed.       Marland Kitchen levothyroxine (SYNTHROID, LEVOTHROID) 100 MCG tablet Take 1 tablet (100 mcg total) by mouth daily before breakfast.  90 tablet  3  . Multiple Vitamins-Minerals (EYE VITAMINS) TABS Take 1 tablet by mouth daily.        . nitroGLYCERIN (NITROSTAT) 0.4 MG SL tablet Place 1 tablet (0.4 mg total) under the tongue every 5 (five) minutes as needed. For chest pain  25 tablet  11  . ZETIA 10 MG tablet TAKE 1 TABLET BY MOUTH DAILY.  30 tablet  9   No current facility-administered medications for this visit.    Allergies  Allergen Reactions  . Lanolin Itching  . Latex Other (See Comments)    Unknown allergy testing showed allergic  . Shellfish Allergy Hives  . Cephalexin Itching and Rash  . Ciprofloxacin Rash  . Erythromycin Itching and Rash  . Penicillins Itching and Rash  . Propylene Glycol Itching and Rash  . Tetracycline Itching and Rash  . Tetracyclines & Related Itching and Rash    Past Medical History  Diagnosis Date  . CAD (coronary  artery disease)     unspecified site with NSTEMI 2009. Small OM treated with PCA  . Atopic eczema   . Osteoporosis     hx tailbone fx; bisphos >91yr tx, stopped 2011  . Hyperlipidemia     intol statin  . Hypothyroidism   . Essential hypertension, benign     white coat     dr cooper  . Myocardial infarction     2009  . Acute lumbar radiculopathy 04/2012    L L5-S1 s/p decompression surg    Past Surgical History  Procedure Laterality Date  . Appendectomy    . Tonsillectomy    . Carpal tunnel release      bilateral, dorsal release  . Hip surgery      in the 1980s with a staph infection postoperatively  . Laparoscopic hysterectomy    . Cardiac catheterization      2009  . Lumbar laminectomy/decompression microdiscectomy  04/17/2012     Procedure: LUMBAR LAMINECTOMY/DECOMPRESSION MICRODISCECTOMY 1 LEVEL;  Surgeon: Erline Levine, MD;  Location: [facility];  Service: Neurosurgery;  Laterality: Left;  Left Lumbar five-Sacral one Microdiskectomy    History  Smoking status  . Never Smoker   Smokeless tobacco  . Not on file    History  Alcohol Use  . 0.0 oz/week    Comment: 1 glass of wine daily    Family History  Problem Relation Age of Onset  . Colon cancer Mother 63    1st degree  . Stroke Father 41  . Stroke      Grandfather- grandmother    Review of Systems: The review of systems is per the HPI.  All other systems were reviewed and are negative.  Physical Exam: BP 130/78  Pulse 92  Ht 5\' 2"  (1.575 m)  Wt 148 lb 12.8 oz (67.495 kg)  BMI 27.21 kg/m2  SpO2 99% Patient is very pleasant and in no acute distress. Little anxious. Skin is warm and dry. Color is normal.  HEENT is unremarkable. Normocephalic/atraumatic. PERRL. Sclera are nonicteric. Neck is supple. No masses. No JVD. Lungs are clear. Cardiac exam shows a regular rate and rhythm. Abdomen is soft. Extremities are without edema. Gait and ROM are intact. No gross neurologic deficits noted.  Wt Readings from Last 3 Encounters:  11/08/13 148 lb 12.8 oz (67.495 kg)  01/07/13 145 lb 4 oz (65.885 kg)  06/08/12 143 lb 12.8 oz (65.227 kg)     LABORATORY DATA: EKG today shows sinus rhythm. Old inferior MI.   Lab Results  Component Value Date   WBC 7.5 04/16/2012   HGB 14.1 04/16/2012   HCT 41.4 04/16/2012   PLT 251 04/16/2012   GLUCOSE 87 04/16/2012   CHOL 192 01/07/2013   TRIG 87.0 01/07/2013   HDL 45.50 01/07/2013   LDLCALC 129* 01/07/2013   ALT 17 11/04/2011   AST 17 11/04/2011   NA 141 04/16/2012   K 3.9 04/16/2012   CL 105 04/16/2012   CREATININE 0.74 04/16/2012   BUN 13 04/16/2012   CO2 27 04/16/2012   TSH 0.07* 01/07/2013   INR 0.89 04/07/2012   HGBA1C  Value: 5.4 (NOTE)   The ADA recommends the following therapeutic goals for glycemic   control related to  Hgb A1C measurement:   Goal of Therapy:   < 7.0% Hgb A1C   Action Suggested:  > 8.0% Hgb A1C   Ref:  Diabetes Care, 22, Suppl. 1, 1999 11/10/2007   CORONARY ANGIOGRAPHY: Left mainstem: The left  mainstem is moderately  calcified. There is minimal plaque throughout the body of the left  main. There is minor tapering at the distal left main just at the  bifurcation of the LAD and left circumflex. There is an approximate 20%  stenosis at the distal left main bifurcation.  LAD: The LAD is a heavily calcified vessel. It is a large vessel that  wraps around the LV apex. The proximal LAD first septal perforator has  a calcified 50-60% stenosis. Just beyond this area at the origin of the  first diagonal branch there is a second 50% stenosis that extends to the  second diagonal branch. The remaining portions of the vessel have mild  nonobstructive plaque, but no significant angiographic stenosis.  Left circumflex: The left circumflex is a moderate size vessel  proximally. After the first two marginal branches, the vessel tapers  into a much smaller vessel. The first OM branch is a small vessel and  has a 70% proximal stenosis. The second OM branch is also a small  vessel, but divides into two branches that supply the lateral wall.  There is a 95% stenosis just before the bifurcation of those two  branches. There is TIMI III flow in the circumflex. The circumflex is  also moderately calcified.  Right coronary artery: The right coronary artery has mild diffuse  calcification. There is 20% stenosis throughout the proximal portion.  The mid and distal right coronary artery have no significant  angiographic stenosis. Distally the vessel bifurcates into a small PDA  and a larger posterolateral branch that have no significant stenosis.  Ventriculography was performed in the RAO and LAO projection. In the  RAO projection left ventricular wall segments are all normal. In the  LAO projection there is clearly  of focal akinetic segment of the lateral  wall. The overall LVEF is preserved at 60-65%. There is no mitral  regurgitation.  ASSESSMENT:  1. Severe second obtuse marginal stenosis with successful percutaneous  transluminal coronary angioplasty.  2. Moderate diffuse left anterior descending coronary artery stenosis.  3. Mild right coronary artery stenosis.  4. Focal lateral wall akinesis with preserved overall left ventricular  function.  PLAN: Victoria [name] should continue on aspirin and Plavix for 12 months  in the setting of her non ST elevation MI. Will institute medical  therapy for her residual CAD. As noted above, PTCA alone was performed  because the OM-2 was too small for a stent. She has moderate LAD  stenosis that I think will respond well to medical therapy.  Juanda Bond. Burt Knack, MD  Electronically Signed  MDC/MEDQ D: 11/11/2007 T: 11/12/2007 Job: [id number]   Assessment / Plan: 1. Known CAD with prior NSTEMI with balloon PCI to the LCX back in 2009 - negative stress echo in 2012 - known residual disease as well - would refer on for repeat cardiac cath. Add low dose lopressor as well. Check labs and send for CXr today as well. The procedure, risks, benefits have been reviewed and she is willing to proceed. Patient has been seen by Dr. Burt Knack today who is in agreement with this plan. Procedure set for 11/18/2013  2. HTN/white coat syndrome - BP ok today.    3. HLD - statin intolerance - only on Zetia  4. Vision disturbance/headache - will need carotid doppler study.   Patient is agreeable to this plan and will call if any problems develop in the interim.   Burtis Junes, RN, [facility]  [address] [place], [place]  [id number] [phone]

## 2013-11-08 NOTE — Telephone Encounter (Signed)
New message     Pt is having a cardiac cath---however, she is allergic to shellfish. Pt want to know what is the plan for the dye that is supposed to be used.

## 2013-11-08 NOTE — Patient Instructions (Addendum)
Stay on your current medicines but I am adding Lopressor 25 mg two times a day  I have refilled your Zetia and your NTG  Use your NTG under your tongue for recurrent chest pain. May take one tablet every 5 minutes. If you are still having discomfort after 3 tablets in 15 minutes, call 911.  We will arrange for a carotid doppler study  We are arranging a heart catheterization with Dr. Burt Knack  We need to check labs next week and send you for a CXR today  Please go to [facility] [place] on the first floor for a chest Xray - you may walk in.   You are scheduled for a cardiac catheterization on Monday, February 16th at 10:30 amwith Dr. Burt Knack or associate.  Go to [facility] 2nd Floor Short Stay on Monday, February 16th at 8:30a.  Enter thru the [employer] entrance A No food or drink after midnight on Sunday. You may take your medications with a sip of water on the day of your procedure.    Call the [place] office at [phone] if you have any questions, problems or concerns.    Coronary Angiography Coronary angiography is an X-ray procedure used to look at the arteries in the heart. In this procedure, a dye (contrast dye) is injected through a long, hollow tube (catheter). The catheter is about the size of a piece of cooked spaghetti and is inserted through your groin, wrist, or arm. The dye is injected into each artery, and X-rays are then taken to show if there is a blockage in the arteries of your heart. LET [facility] CARE PROVIDER KNOW ABOUT:  Any allergies you have, including allergies to shellfish or contrast dye.   All medicines you are taking, including vitamins, herbs, eye drops, creams, and over-the-counter medicines.   Previous problems you or members of your family have had with the use of anesthetics.   Any blood disorders you have.   Previous surgeries you have had.  History of kidney problems or failure.    Other medical conditions you have. RISKS AND COMPLICATIONS  Generally, coronary angiography is a safe procedure. However, as with any procedure, complications can occur. Possible complications include:  Allergic reaction to the dye.  Bleeding from the access site or other locations.  Kidney injury, especially in people with impaired kidney function.  Stroke (rare).  Heart attack (rare). BEFORE THE PROCEDURE   Do not eat or drink anything after midnight the night before the procedure, or as directed by your health care provider.   Ask your health care provider if it is okay to take any needed medicines with a sip of water.  PROCEDURE  You may be given a medicine to help you relax (sedative) before the procedure. This medicine is given through an intravenous (IV) access tube that is inserted into one of your veins.   The area where the catheter will be inserted is washed and shaved. This is usually done in the groin but may be done in the fold of your arm (near your elbow) or in the wrist.   A medicine will be given to numb the area where the catheter will be inserted (local anesthetic).   The health care provider will insert the catheter into an artery. The catheter is guided by using a special type of X-ray (fluoroscopy) of the blood vessel being examined.   A special dye is then injected into the catheter, and  X-rays are taken. The dye helps to show where any narrowing or blockages are located in the heart arteries.  AFTER THE PROCEDURE   If the procedure is done through the leg, you will be kept in bed lying flat for several hours. You will be instructed to not bend or cross your legs.  The insertion site will be checked frequently.   The pulse in your feet or wrist will be checked frequently.   Additional blood tests, X-rays, and an electrocardiogram may be done.   You may need to stay in the hospital overnight for observation.  Document Released:  03/26/2003 Document Revised: 05/22/2013 Document Reviewed: 02/11/2013 [facility] Patient Information 2014 [place].

## 2013-11-13 ENCOUNTER — Encounter (HOSPITAL_COMMUNITY): Payer: Self-pay | Admitting: Pharmacist

## 2013-11-14 ENCOUNTER — Ambulatory Visit (HOSPITAL_COMMUNITY): Payer: Medicare Other | Attending: Cardiology

## 2013-11-14 ENCOUNTER — Telehealth: Payer: Self-pay | Admitting: Cardiovascular Disease

## 2013-11-14 ENCOUNTER — Other Ambulatory Visit (INDEPENDENT_AMBULATORY_CARE_PROVIDER_SITE_OTHER): Payer: Medicare Other

## 2013-11-14 DIAGNOSIS — E785 Hyperlipidemia, unspecified: Secondary | ICD-10-CM | POA: Insufficient documentation

## 2013-11-14 DIAGNOSIS — I6529 Occlusion and stenosis of unspecified carotid artery: Secondary | ICD-10-CM

## 2013-11-14 DIAGNOSIS — R42 Dizziness and giddiness: Secondary | ICD-10-CM

## 2013-11-14 DIAGNOSIS — I252 Old myocardial infarction: Secondary | ICD-10-CM | POA: Diagnosis not present

## 2013-11-14 DIAGNOSIS — I2510 Atherosclerotic heart disease of native coronary artery without angina pectoris: Secondary | ICD-10-CM | POA: Diagnosis not present

## 2013-11-14 DIAGNOSIS — R079 Chest pain, unspecified: Secondary | ICD-10-CM | POA: Diagnosis not present

## 2013-11-14 DIAGNOSIS — I658 Occlusion and stenosis of other precerebral arteries: Secondary | ICD-10-CM | POA: Insufficient documentation

## 2013-11-14 DIAGNOSIS — R51 Headache: Secondary | ICD-10-CM | POA: Diagnosis not present

## 2013-11-14 DIAGNOSIS — H539 Unspecified visual disturbance: Secondary | ICD-10-CM | POA: Diagnosis not present

## 2013-11-14 LAB — BASIC METABOLIC PANEL
BUN: 17 mg/dL (ref 6–23)
CO2: 30 mEq/L (ref 19–32)
Calcium: 10.1 mg/dL (ref 8.4–10.5)
Chloride: 104 mEq/L (ref 96–112)
Creatinine, Ser: 0.7 mg/dL (ref 0.4–1.2)
GFR: 88.74 mL/min (ref >60.00)
Glucose, Bld: 102 mg/dL — ABNORMAL HIGH (ref 70–99)
Potassium: 4 mEq/L (ref 3.5–5.1)
Sodium: 139 mEq/L (ref 135–145)

## 2013-11-14 LAB — CBC
HCT: 39.4 % (ref 36.0–46.0)
Hemoglobin: 12.9 g/dL (ref 12.0–15.0)
MCHC: 32.9 g/dL (ref 30.0–36.0)
MCV: 94.8 fl (ref 78.0–100.0)
Platelets: 266 10*3/uL (ref 150.0–400.0)
RBC: 4.15 Mil/uL (ref 3.87–5.11)
RDW: 13.3 % (ref 11.5–14.6)
WBC: 7.5 10*3/uL (ref 4.5–10.5)

## 2013-11-14 LAB — APTT: aPTT: 25.4 s (ref 21.7–28.8)

## 2013-11-14 LAB — PROTIME-INR
INR: 1.1 ratio — ABNORMAL HIGH (ref 0.8–1.0)
Prothrombin Time: 11.2 s (ref 10.2–12.4)

## 2013-11-14 NOTE — Telephone Encounter (Signed)
Spoke with patient and advised of post-cath precautions:  With radial approach - no driving and guard wrist (cath site) x 24 hours and no heavy lifting x 1 week With femoral approach - no driving x 48 hours, guarded activity and no heavy lifting x 1 week.  Patient verbalized understanding and gratitude.

## 2013-11-14 NOTE — Telephone Encounter (Signed)
New message   patient calling - husband has a schedule  trip on Thursday of next week to [facility] .  Patient is asking should her husband postpone his trip due to care she might need after her procedure on Monday.

## 2013-11-15 ENCOUNTER — Telehealth: Payer: Self-pay | Admitting: Cardiovascular Disease

## 2013-11-15 NOTE — Telephone Encounter (Signed)
New message    Calling for test results  

## 2013-11-15 NOTE — Telephone Encounter (Signed)
Pt made aware of lab and echo results.  She is for heart cath on monday

## 2013-11-18 ENCOUNTER — Ambulatory Visit (HOSPITAL_COMMUNITY)
Admission: RE | Admit: 2013-11-18 | Discharge: 2013-11-18 | Disposition: A | Discharge status: Home / Self Care | Payer: Medicare Other | Source: Ambulatory Visit | Attending: Cardiovascular Disease | Admitting: Cardiovascular Disease

## 2013-11-18 ENCOUNTER — Encounter (HOSPITAL_COMMUNITY)
Admission: RE | Disposition: A | Discharge status: Home / Self Care | Payer: Self-pay | Source: Ambulatory Visit | Attending: Cardiovascular Disease

## 2013-11-18 DIAGNOSIS — I10 Essential (primary) hypertension: Secondary | ICD-10-CM | POA: Diagnosis not present

## 2013-11-18 DIAGNOSIS — Z7982 Long term (current) use of aspirin: Secondary | ICD-10-CM | POA: Diagnosis not present

## 2013-11-18 DIAGNOSIS — I2510 Atherosclerotic heart disease of native coronary artery without angina pectoris: Secondary | ICD-10-CM | POA: Insufficient documentation

## 2013-11-18 DIAGNOSIS — I252 Old myocardial infarction: Secondary | ICD-10-CM | POA: Insufficient documentation

## 2013-11-18 DIAGNOSIS — R079 Chest pain, unspecified: Secondary | ICD-10-CM

## 2013-11-18 DIAGNOSIS — M810 Age-related osteoporosis without current pathological fracture: Secondary | ICD-10-CM | POA: Diagnosis not present

## 2013-11-18 DIAGNOSIS — I209 Angina pectoris, unspecified: Secondary | ICD-10-CM | POA: Insufficient documentation

## 2013-11-18 DIAGNOSIS — Z9861 Coronary angioplasty status: Secondary | ICD-10-CM | POA: Insufficient documentation

## 2013-11-18 DIAGNOSIS — E785 Hyperlipidemia, unspecified: Secondary | ICD-10-CM | POA: Diagnosis not present

## 2013-11-18 DIAGNOSIS — E039 Hypothyroidism, unspecified: Secondary | ICD-10-CM | POA: Diagnosis not present

## 2013-11-18 DIAGNOSIS — H539 Unspecified visual disturbance: Secondary | ICD-10-CM | POA: Diagnosis not present

## 2013-11-18 HISTORY — PX: LEFT HEART CATHETERIZATION WITH CORONARY ANGIOGRAM: SHX5451

## 2013-11-18 LAB — POCT ACTIVATED CLOTTING TIME
Activated Clotting Time: 177 seconds
Activated Clotting Time: 221 seconds

## 2013-11-18 SURGERY — LEFT HEART CATHETERIZATION WITH CORONARY ANGIOGRAM
Anesthesia: LOCAL

## 2013-11-18 MED ORDER — VERAPAMIL HCL 2.5 MG/ML IV SOLN
INTRAVENOUS | Status: AC
  Filled 2013-11-18: qty 2

## 2013-11-18 MED ORDER — SODIUM CHLORIDE 0.9 % IJ SOLN: 3.0000 mL | Freq: Two times a day (BID) | INTRAMUSCULAR | Status: DC

## 2013-11-18 MED ORDER — NITROGLYCERIN 0.2 MG/ML ON CALL CATH LAB
INTRAVENOUS | Status: AC
  Filled 2013-11-18: qty 1

## 2013-11-18 MED ORDER — OXYCODONE-ACETAMINOPHEN 5-325 MG PO TABS: 1.0000 | ORAL_TABLET | ORAL | Status: DC | PRN

## 2013-11-18 MED ORDER — ISOSORBIDE MONONITRATE ER 30 MG PO TB24: 15.0000 mg | ORAL_TABLET | Freq: Every day | ORAL | Status: DC

## 2013-11-18 MED ORDER — SODIUM CHLORIDE 0.9 % IV SOLN: 1.0000 mL/kg/h | INTRAVENOUS | Status: DC

## 2013-11-18 MED ORDER — DIAZEPAM 5 MG PO TABS
10.0000 mg | ORAL_TABLET | ORAL | Status: AC
  Administered 2013-11-18: 10 mg via ORAL
  Filled 2013-11-18: qty 2

## 2013-11-18 MED ORDER — MIDAZOLAM HCL 2 MG/2ML IJ SOLN
INTRAMUSCULAR | Status: AC
Start: 2013-11-18 — End: 2013-11-18
  Filled 2013-11-18: qty 2

## 2013-11-18 MED ORDER — ACETAMINOPHEN 325 MG PO TABS: 650.0000 mg | ORAL_TABLET | ORAL | Status: DC | PRN

## 2013-11-18 MED ORDER — FENTANYL CITRATE 0.05 MG/ML IJ SOLN
INTRAMUSCULAR | Status: AC
  Filled 2013-11-18: qty 2

## 2013-11-18 MED ORDER — SODIUM CHLORIDE 0.9 % IJ SOLN: 3.0000 mL | INTRAMUSCULAR | Status: DC | PRN

## 2013-11-18 MED ORDER — SODIUM CHLORIDE 0.9 % IV SOLN
INTRAVENOUS | Status: DC
  Administered 2013-11-18: 09:00:00 via INTRAVENOUS

## 2013-11-18 MED ORDER — LIDOCAINE HCL (PF) 1 % IJ SOLN
INTRAMUSCULAR | Status: AC
  Filled 2013-11-18: qty 30

## 2013-11-18 MED ORDER — HEPARIN SODIUM (PORCINE) 1000 UNIT/ML IJ SOLN
INTRAMUSCULAR | Status: AC
Start: 2013-11-18 — End: 2013-11-18
  Filled 2013-11-18: qty 1

## 2013-11-18 MED ORDER — ONDANSETRON HCL 4 MG/2ML IJ SOLN: 4.0000 mg | Freq: Four times a day (QID) | INTRAMUSCULAR | Status: DC | PRN

## 2013-11-18 MED ORDER — HEPARIN (PORCINE) IN NACL 2-0.9 UNIT/ML-% IJ SOLN
INTRAMUSCULAR | Status: AC
  Filled 2013-11-18: qty 1000

## 2013-11-18 MED ORDER — SODIUM CHLORIDE 0.9 % IV SOLN: 250.0000 mL | INTRAVENOUS | Status: DC | PRN

## 2013-11-18 MED ORDER — ASPIRIN 81 MG PO CHEW
81.0000 mg | CHEWABLE_TABLET | ORAL | Status: AC
  Administered 2013-11-18: 81 mg via ORAL
  Filled 2013-11-18: qty 1

## 2013-11-18 MED ORDER — ADENOSINE 12 MG/4ML IV SOLN
12.0000 mL | Freq: Once | INTRAVENOUS | Status: DC
  Filled 2013-11-18: qty 12

## 2013-11-18 NOTE — CV Procedure (Signed)
Cardiac Catheterization Procedure Note  Name: Victoria [name] MRN: [id number] DOB: May 01, 1941  Procedure: Left Heart Cath, Selective Coronary Angiography, LV angiography, FFR of the LAD  Indication: CCS Class 3 angina   Procedural Details: The right wrist was prepped, draped, and anesthetized with 1% lidocaine. Using the modified Seldinger technique, a 5/6 French sheath was introduced into the right radial artery. 3 mg of verapamil was administered through the sheath, weight-based unfractionated heparin was administered intravenously. Standard Judkins catheters were used for selective coronary angiography and left ventriculography. Catheter exchanges were performed over an exchange length guidewire.   Following the diagnostic procedure I elected to perform pressure wire analysis of the LAD. The patient had moderate stenosis in the mid LAD with heavy calcification. I compared her films to her previous study from 2009. There is some progression of disease and considering her anginal symptoms I felt this should be further interrogated. She was given additional IV heparin. A therapeutic ACT was achieved. A 6 French XB LAD 3.5 cm guide catheter was inserted. Intra-coronary nitroglycerin was administered. The pressure wire was zeroed and normalized at the tip of the guide catheter. The wire was advanced beyond the lesion in the mid LAD. Intravenous adenosine was administered. The FFR at peak hyperemia was 0.80. Considering the length of the lesion and heavy calcification with a borderline FFR, I elected to treat her medically as an initial approach. There were no immediate procedural complications. A TR band was used for radial hemostasis at the completion of the procedure.  The patient was transferred to the post catheterization recovery area for further monitoring.  Procedural Findings: Hemodynamics: AO 126/68 with a mean of 94 LV 124/8  Coronary angiography: Coronary dominance: right  Left  mainstem: The left main is moderately calcified. The vessel has 30% distal stenosis. There is no high-grade disease noted.  Left anterior descending (LAD): The LAD is heavily calcified. The ostium has 20% stenosis. The proximal vessel has heavy calcification with no significant stenosis. At the first septal perforator there is 40-50% stenosis. The second diagonal branch there is 60-70% stenosis. There is a long area of segmental disease from the first perforators through the second diagonal branch. The mid and distal LAD have no significant obstructive disease beyond the second diagonal.  Left circumflex (LCx): The left circumflex is patent. The obtuse marginal at the site of previous balloon angioplasty is patent without significant residual stenosis. There are 4 OM branches with no significant disease the there is moderate circumflex calcification as well  Right coronary artery (RCA): The RCA is moderately calcified. There is mild nonobstructive plaque in the proximal vessel. The mid and distal vessel had no significant disease. The PDA and PLA branches are patent without significant disease.  Left ventriculography: Left ventricular systolic function is normal, LVEF is estimated at 55-65%, there is no significant mitral regurgitation   Final Conclusions:   1. Moderate diffuse mid LAD stenosis with borderline FFR of 0.80 2. Minor nonobstructive disease in the left circumflex and right coronary arteries 3. Normal LV systolic function  Recommendations: The patient has some progression of LAD stenosis compared to her 2009 cath. Angiographically there is moderate effused disease through the midsegment. By pressure wire analysis there is borderline hemodynamic significance. I think medical therapy is indicated for an initial approach. She was just recently started on metoprolol 25 mg 3 times a day. I'm going to add Imdur 15 mg daily. She's had some sensitivity to medication in the past. We'll bring  her  back for early followup in medicine titration with Truitt Merle. If she fails medical therapy, PCI for LAD would be reasonable.  [name] 11/18/2013, 11:51 AM

## 2013-11-18 NOTE — Discharge Instructions (Signed)

## 2013-11-18 NOTE — Progress Notes (Signed)
Bleeding present at the site of the radial band. 6cc air replaced into the TR Band to achieve hemostasis.

## 2013-11-18 NOTE — H&P (View-Only) (Signed)
Victoria [name] Date of Birth: [birth year] Medical Record [id number]  History of Present Illness: Victoria [name] is seen back today for a follow up visit. Seen for Dr. Burt Knack. She is a 73 year old female with known CAD - she had NSTEMI in 2009 and noted to have small vessel disease involving the branch of the LCX. She did have balloon PCI at that time. Last functional study back in August of 2012 showed no ischemia. Other issues include hypothyroidism, HLD with statin intolerance - on Zetia, osteoporosis and HTN - with white coat syndrome.   Last seen in September of 2013 - seemed to be doing well.   Comes back today. Here alone. Did not get her recall letter for 2014 visit for Dr. Burt Knack. She is here because she has not felt well since November. Something "is not right'. She has had headaches. She is profoundly fatigued - now having to take a nap which is very unusual for her. Having chest tightness - every couple of days. Has had spells where she has woken up at night with her left arm hurting. She has had some indigestion off and on and her diet has not changed. Her chest tightness is a similar to her prior chest pain syndrome. She has used NTG on occasion with prompt relief but gets a terrible headache. Usually taking aspirin for her symptoms. She had stopped exercising but tried to get on her treadmill earlier this week and had to stop due to discomfort. Noted today some vision issue where "the molding on the door was vibrating". She has had some associated dyspnea.   Current Outpatient Prescriptions  Medication Sig Dispense Refill  . aspirin 81 MG tablet Take 81 mg by mouth daily.        . Calcium Carb-Cholecalciferol 600-400 MG-UNIT TABS Take 1 tablet by mouth 2 (two) times daily.        . clobetasol (TEMOVATE) 0.05 % external solution APPLY TO THE AFFECTED TOPICAL AREA 2 TIMES DAILY.  50 mL  0  . Coenzyme Q10 (CO Q-10) 300 MG CAPS Take 1 capsule by mouth daily.      Marland Kitchen desoximetasone  (TOPICORT) 0.25 % cream Apply 1 application topically 2 (two) times daily as needed. For itching      . diphenhydrAMINE (BENADRYL) 25 mg capsule Take 25 mg by mouth every 6 (six) hours as needed.       Marland Kitchen levothyroxine (SYNTHROID, LEVOTHROID) 100 MCG tablet Take 1 tablet (100 mcg total) by mouth daily before breakfast.  90 tablet  3  . Multiple Vitamins-Minerals (EYE VITAMINS) TABS Take 1 tablet by mouth daily.        . nitroGLYCERIN (NITROSTAT) 0.4 MG SL tablet Place 1 tablet (0.4 mg total) under the tongue every 5 (five) minutes as needed. For chest pain  25 tablet  11  . ZETIA 10 MG tablet TAKE 1 TABLET BY MOUTH DAILY.  30 tablet  9   No current facility-administered medications for this visit.    Allergies  Allergen Reactions  . Lanolin Itching  . Latex Other (See Comments)    Unknown allergy testing showed allergic  . Shellfish Allergy Hives  . Cephalexin Itching and Rash  . Ciprofloxacin Rash  . Erythromycin Itching and Rash  . Penicillins Itching and Rash  . Propylene Glycol Itching and Rash  . Tetracycline Itching and Rash  . Tetracyclines & Related Itching and Rash    Past Medical History  Diagnosis Date  . CAD (coronary  artery disease)     unspecified site with NSTEMI 2009. Small OM treated with PCA  . Atopic eczema   . Osteoporosis     hx tailbone fx; bisphos >91yr tx, stopped 2011  . Hyperlipidemia     intol statin  . Hypothyroidism   . Essential hypertension, benign     white coat     dr cooper  . Myocardial infarction     2009  . Acute lumbar radiculopathy 04/2012    L L5-S1 s/p decompression surg    Past Surgical History  Procedure Laterality Date  . Appendectomy    . Tonsillectomy    . Carpal tunnel release      bilateral, dorsal release  . Hip surgery      in the 1980s with a staph infection postoperatively  . Laparoscopic hysterectomy    . Cardiac catheterization      2009  . Lumbar laminectomy/decompression microdiscectomy  04/17/2012     Procedure: LUMBAR LAMINECTOMY/DECOMPRESSION MICRODISCECTOMY 1 LEVEL;  Surgeon: Erline Levine, MD;  Location: [facility];  Service: Neurosurgery;  Laterality: Left;  Left Lumbar five-Sacral one Microdiskectomy    History  Smoking status  . Never Smoker   Smokeless tobacco  . Not on file    History  Alcohol Use  . 0.0 oz/week    Comment: 1 glass of wine daily    Family History  Problem Relation Age of Onset  . Colon cancer Mother 63    1st degree  . Stroke Father 41  . Stroke      Grandfather- grandmother    Review of Systems: The review of systems is per the HPI.  All other systems were reviewed and are negative.  Physical Exam: BP 130/78  Pulse 92  Ht 5\' 2"  (1.575 m)  Wt 148 lb 12.8 oz (67.495 kg)  BMI 27.21 kg/m2  SpO2 99% Patient is very pleasant and in no acute distress. Little anxious. Skin is warm and dry. Color is normal.  HEENT is unremarkable. Normocephalic/atraumatic. PERRL. Sclera are nonicteric. Neck is supple. No masses. No JVD. Lungs are clear. Cardiac exam shows a regular rate and rhythm. Abdomen is soft. Extremities are without edema. Gait and ROM are intact. No gross neurologic deficits noted.  Wt Readings from Last 3 Encounters:  11/08/13 148 lb 12.8 oz (67.495 kg)  01/07/13 145 lb 4 oz (65.885 kg)  06/08/12 143 lb 12.8 oz (65.227 kg)     LABORATORY DATA: EKG today shows sinus rhythm. Old inferior MI.   Lab Results  Component Value Date   WBC 7.5 04/16/2012   HGB 14.1 04/16/2012   HCT 41.4 04/16/2012   PLT 251 04/16/2012   GLUCOSE 87 04/16/2012   CHOL 192 01/07/2013   TRIG 87.0 01/07/2013   HDL 45.50 01/07/2013   LDLCALC 129* 01/07/2013   ALT 17 11/04/2011   AST 17 11/04/2011   NA 141 04/16/2012   K 3.9 04/16/2012   CL 105 04/16/2012   CREATININE 0.74 04/16/2012   BUN 13 04/16/2012   CO2 27 04/16/2012   TSH 0.07* 01/07/2013   INR 0.89 04/07/2012   HGBA1C  Value: 5.4 (NOTE)   The ADA recommends the following therapeutic goals for glycemic   control related to  Hgb A1C measurement:   Goal of Therapy:   < 7.0% Hgb A1C   Action Suggested:  > 8.0% Hgb A1C   Ref:  Diabetes Care, 22, Suppl. 1, 1999 11/10/2007   CORONARY ANGIOGRAPHY: Left mainstem: The left  mainstem is moderately  calcified. There is minimal plaque throughout the body of the left  main. There is minor tapering at the distal left main just at the  bifurcation of the LAD and left circumflex. There is an approximate 20%  stenosis at the distal left main bifurcation.  LAD: The LAD is a heavily calcified vessel. It is a large vessel that  wraps around the LV apex. The proximal LAD first septal perforator has  a calcified 50-60% stenosis. Just beyond this area at the origin of the  first diagonal branch there is a second 50% stenosis that extends to the  second diagonal branch. The remaining portions of the vessel have mild  nonobstructive plaque, but no significant angiographic stenosis.  Left circumflex: The left circumflex is a moderate size vessel  proximally. After the first two marginal branches, the vessel tapers  into a much smaller vessel. The first OM branch is a small vessel and  has a 70% proximal stenosis. The second OM branch is also a small  vessel, but divides into two branches that supply the lateral wall.  There is a 95% stenosis just before the bifurcation of those two  branches. There is TIMI III flow in the circumflex. The circumflex is  also moderately calcified.  Right coronary artery: The right coronary artery has mild diffuse  calcification. There is 20% stenosis throughout the proximal portion.  The mid and distal right coronary artery have no significant  angiographic stenosis. Distally the vessel bifurcates into a small PDA  and a larger posterolateral branch that have no significant stenosis.  Ventriculography was performed in the RAO and LAO projection. In the  RAO projection left ventricular wall segments are all normal. In the  LAO projection there is clearly  of focal akinetic segment of the lateral  wall. The overall LVEF is preserved at 60-65%. There is no mitral  regurgitation.  ASSESSMENT:  1. Severe second obtuse marginal stenosis with successful percutaneous  transluminal coronary angioplasty.  2. Moderate diffuse left anterior descending coronary artery stenosis.  3. Mild right coronary artery stenosis.  4. Focal lateral wall akinesis with preserved overall left ventricular  function.  PLAN: Ms. Ikner should continue on aspirin and Plavix for 12 months  in the setting of her non ST elevation MI. Will institute medical  therapy for her residual CAD. As noted above, PTCA alone was performed  because the OM-2 was too small for a stent. She has moderate LAD  stenosis that I think will respond well to medical therapy.  Juanda Bond. Burt Knack, MD  Electronically Signed  MDC/MEDQ D: 11/11/2007 T: 11/12/2007 Job: [id number]   Assessment / Plan: 1. Known CAD with prior NSTEMI with balloon PCI to the LCX back in 2009 - negative stress echo in 2012 - known residual disease as well - would refer on for repeat cardiac cath. Add low dose lopressor as well. Check labs and send for CXr today as well. The procedure, risks, benefits have been reviewed and she is willing to proceed. Patient has been seen by Dr. Burt Knack today who is in agreement with this plan. Procedure set for 11/18/2013  2. HTN/white coat syndrome - BP ok today.    3. HLD - statin intolerance - only on Zetia  4. Vision disturbance/headache - will need carotid doppler study.   Patient is agreeable to this plan and will call if any problems develop in the interim.   Burtis Junes, RN, [place] [facility]  [address] [place], [place]  [address] [phone]

## 2013-11-18 NOTE — Interval H&P Note (Signed)
History and Physical Interval Note:  11/18/2013 10:42 AM  Victoria [name]  has presented today for surgery, with the diagnosis of cad/unstable angina  The various methods of treatment have been discussed with the patient and family. After consideration of risks, benefits and other options for treatment, the patient has consented to  Procedure(s): LEFT HEART CATHETERIZATION WITH CORONARY ANGIOGRAM (N/A) as a surgical intervention .  The patient's history has been reviewed, patient examined, no change in status, stable for surgery.  I have reviewed the patient's chart and labs.  Questions were answered to the patient's satisfaction.    Cath Lab Visit (complete for each Cath Lab visit)  Clinical Evaluation Leading to the Procedure:   ACS: no  Non-ACS:    Anginal Classification: CCS III  Anti-ischemic medical therapy: Minimal Therapy (1 class of medications)  Non-Invasive Test Results: No non-invasive testing performed  Prior CABG: No previous CABG       Victoria [name]

## 2013-12-06 ENCOUNTER — Ambulatory Visit (INDEPENDENT_AMBULATORY_CARE_PROVIDER_SITE_OTHER): Payer: Medicare Other | Admitting: Nurse Practitioner

## 2013-12-06 ENCOUNTER — Encounter: Payer: Self-pay | Admitting: Nurse Practitioner

## 2013-12-06 VITALS — BP 178/111 | HR 97 | Ht 62.0 in | Wt 147.0 lb

## 2013-12-06 DIAGNOSIS — R079 Chest pain, unspecified: Secondary | ICD-10-CM

## 2013-12-06 DIAGNOSIS — I2510 Atherosclerotic heart disease of native coronary artery without angina pectoris: Secondary | ICD-10-CM

## 2013-12-06 DIAGNOSIS — I10 Essential (primary) hypertension: Secondary | ICD-10-CM

## 2013-12-06 LAB — HEPATIC FUNCTION PANEL
ALT: 22 U/L (ref 0–35)
AST: 20 U/L (ref 0–37)
Albumin: 4.1 g/dL (ref 3.5–5.2)
Alkaline Phosphatase: 67 U/L (ref 39–117)
Bilirubin, Direct: 0.1 mg/dL (ref 0.0–0.3)
Total Bilirubin: 0.6 mg/dL (ref 0.3–1.2)
Total Protein: 7.5 g/dL (ref 6.0–8.3)

## 2013-12-06 LAB — BASIC METABOLIC PANEL
BUN: 15 mg/dL (ref 6–23)
CO2: 27 mEq/L (ref 19–32)
Calcium: 10.6 mg/dL — ABNORMAL HIGH (ref 8.4–10.5)
Chloride: 106 mEq/L (ref 96–112)
Creatinine, Ser: 0.7 mg/dL (ref 0.4–1.2)
GFR: 83.13 mL/min (ref >60.00)
Glucose, Bld: 81 mg/dL (ref 70–99)
Potassium: 3.7 mEq/L (ref 3.5–5.1)
Sodium: 139 mEq/L (ref 135–145)

## 2013-12-06 LAB — LIPID PANEL
Cholesterol: 198 mg/dL (ref 0–200)
HDL: 45.6 mg/dL (ref >39.00)
LDL Cholesterol: 129 mg/dL — ABNORMAL HIGH (ref 0–99)
Total CHOL/HDL Ratio: 4
Triglycerides: 118 mg/dL (ref 0.0–149.0)
VLDL: 23.6 mg/dL (ref 0.0–40.0)

## 2013-12-06 NOTE — Patient Instructions (Addendum)
Stay on your current medicines - try to start the Imdur as directed  We will check labs today  Monitor your blood pressure at home - goal is to be below 140/90 - call me if this is not the case - please check this afternoon  See me in 3 weeks on a day that Dr. Burt Knack is here  Call the [place] office at [phone] if you have any questions, problems or concerns.

## 2013-12-06 NOTE — Progress Notes (Signed)
Victoria [name] Date of Birth: 01-01-1941 Medical Record [id number]  History of Present Illness: Victoria [name] is seen back today for a post cath visit. Seen for Dr. Burt Knack. She is a 73 year old female with known CAD - she had NSTEMI in 2009 and noted to have small vessel disease involving the branch of the LCX. She did have balloon PCI at that time. Last functional study back in August of 2012 showed no ischemia.   Other issues include hypothyroidism, HLD with statin intolerance - on Zetia, osteoporosis, mild carotid disease and HTN - with white coat syndrome.   Seen last month with symptoms felt to be consistent with angina. Referred for cardiac cath. Results noted below.   Comes back today. Here with her husband. She says she is doing fine but in the next breath - still having chest pain. Says "but everything was normal". Does not understand what her cath showed. Says she was too "bedridden" from the metoprolol so she stopped.  Has had chest pain yesterday - took 2 aspirins. Stopped the Imdur because "she did not get enough information about it". Refused EKG today. Not exercising. "Thinking about starting a Mediterranean diet". No recent BP checks. Not able to take statins. Tells me that she had not had her lipids checked in over 2 years - which is not the case.   Current Outpatient Prescriptions  Medication Sig Dispense Refill  . aspirin 81 MG tablet Take 81 mg by mouth daily.        . Calcium Carb-Cholecalciferol 600-400 MG-UNIT TABS Take 1 tablet by mouth daily.       . clobetasol (TEMOVATE) 0.05 % external solution Apply 1 application topically daily as needed. to scalp.      . Coenzyme Q10 (CO Q-10) 300 MG CAPS Take 1 capsule by mouth daily.      . Desoximetasone (TOPICORT) 0.25 % ointment Apply 1 application topically 2 (two) times daily as needed (itching.). Apply to hands.      . diphenhydrAMINE (BENADRYL) 25 mg capsule Take 25 mg by mouth every 6 (six) hours as needed for  allergies.       Marland Kitchen ezetimibe (ZETIA) 10 MG tablet Take 10 mg by mouth daily.      Marland Kitchen levothyroxine (SYNTHROID, LEVOTHROID) 100 MCG tablet Take 1 tablet (100 mcg total) by mouth daily before breakfast.  90 tablet  3  . Multiple Vitamins-Minerals (EYE VITAMINS) TABS Take 1 tablet by mouth daily.        . nitroGLYCERIN (NITROSTAT) 0.4 MG SL tablet Place 1 tablet (0.4 mg total) under the tongue every 5 (five) minutes as needed. For chest pain  25 tablet  11  . isosorbide mononitrate (IMDUR) 30 MG 24 hr tablet Take 0.5 tablets (15 mg total) by mouth daily.  30 tablet  6  . metoprolol tartrate (LOPRESSOR) 25 MG tablet Take 1 tablet (25 mg total) by mouth 2 (two) times daily.  60 tablet  11   No current facility-administered medications for this visit.    Allergies  Allergen Reactions  . Shellfish Allergy Hives and Shortness Of Breath  . Lanolin Itching  . Latex Other (See Comments)    Unknown allergy testing showed allergic  . Cephalexin Itching and Rash  . Ciprofloxacin Rash  . Erythromycin Itching and Rash  . Penicillins Itching and Rash  . Propylene Glycol Itching and Rash  . Tetracycline Itching and Rash  . Tetracyclines & Related Itching and Rash    Past  Medical History  Diagnosis Date  . CAD (coronary artery disease)     unspecified site with NSTEMI 2009. Small OM treated with PCA  . Atopic eczema   . Osteoporosis     hx tailbone fx; bisphos >6yr tx, stopped 2011  . Hyperlipidemia     intol statin  . Hypothyroidism   . Essential hypertension, benign     white coat     dr cooper  . Myocardial infarction     2009  . Acute lumbar radiculopathy 04/2012    L L5-S1 s/p decompression surg    Past Surgical History  Procedure Laterality Date  . Appendectomy    . Tonsillectomy    . Carpal tunnel release      bilateral, dorsal release  . Hip surgery      in the 1980s with a staph infection postoperatively  . Laparoscopic hysterectomy    . Cardiac catheterization      2009  .  Lumbar laminectomy/decompression microdiscectomy  04/17/2012    Procedure: LUMBAR LAMINECTOMY/DECOMPRESSION MICRODISCECTOMY 1 LEVEL;  Surgeon: Erline Levine, MD;  Location: [facility];  Service: Neurosurgery;  Laterality: Left;  Left Lumbar five-Sacral one Microdiskectomy    History  Smoking status  . Never Smoker   Smokeless tobacco  . Not on file    History  Alcohol Use  . 0.0 oz/week    Comment: 1 glass of wine daily    Family History  Problem Relation Age of Onset  . Colon cancer Mother 86    1st degree  . Stroke Father 87  . Stroke      Grandfather- grandmother    Review of Systems: The review of systems is per the HPI.  All other systems were reviewed and are negative.  Physical Exam: BP 178/111  Pulse 97  Wt 147 lb (66.679 kg) Patient is alert and in no acute distress. Skin is warm and dry. Color is normal.  HEENT is unremarkable. Normocephalic/atraumatic. PERRL. Sclera are nonicteric. Neck is supple. No masses. No JVD. Lungs are clear. Cardiac exam shows a regular rate and rhythm. Abdomen is soft. Extremities are without edema. Gait and ROM are intact. No gross neurologic deficits noted. Right wrist is ok.   LABORATORY DATA: PENDING Lab Results  Component Value Date   WBC 7.5 11/14/2013   HGB 12.9 11/14/2013   HCT 39.4 11/14/2013   PLT 266.0 11/14/2013   GLUCOSE 102* 11/14/2013   CHOL 192 01/07/2013   TRIG 87.0 01/07/2013   HDL 45.50 01/07/2013   LDLCALC 129* 01/07/2013   ALT 17 11/04/2011   AST 17 11/04/2011   NA 139 11/14/2013   K 4.0 11/14/2013   CL 104 11/14/2013   CREATININE 0.7 11/14/2013   BUN 17 11/14/2013   CO2 30 11/14/2013   TSH 0.07* 01/07/2013   INR 1.1* 11/14/2013   HGBA1C  Value: 5.4 (NOTE)   The ADA recommends the following therapeutic goals for glycemic   control related to Hgb A1C measurement:   Goal of Therapy:   < 7.0% Hgb A1C   Action Suggested:  > 8.0% Hgb A1C   Ref:  Diabetes Care, 39, Suppl. 1, 1999 11/10/2007   Coronary angiography:  Coronary  dominance: right  Left mainstem: The left main is moderately calcified. The vessel has 30% distal stenosis. There is no high-grade disease noted.  Left anterior descending (LAD): The LAD is heavily calcified. The ostium has 20% stenosis. The proximal vessel has heavy calcification with no significant stenosis. At  the first septal perforator there is 40-50% stenosis. The second diagonal branch there is 60-70% stenosis. There is a long area of segmental disease from the first perforators through the second diagonal branch. The mid and distal LAD have no significant obstructive disease beyond the second diagonal.  Left circumflex (LCx): The left circumflex is patent. The obtuse marginal at the site of previous balloon angioplasty is patent without significant residual stenosis. There are 4 OM branches with no significant disease the there is moderate circumflex calcification as well  Right coronary artery (RCA): The RCA is moderately calcified. There is mild nonobstructive plaque in the proximal vessel. The mid and distal vessel had no significant disease. The PDA and PLA branches are patent without significant disease.  Left ventriculography: Left ventricular systolic function is normal, LVEF is estimated at 55-65%, there is no significant mitral regurgitation  Final Conclusions:  1. Moderate diffuse mid LAD stenosis with borderline FFR of 0.80  2. Minor nonobstructive disease in the left circumflex and right coronary arteries  3. Normal LV systolic function  Recommendations: The patient has some progression of LAD stenosis compared to her 2009 cath. Angiographically there is moderate effused disease through the midsegment. By pressure wire analysis there is borderline hemodynamic significance. I think medical therapy is indicated for an initial approach. She was just recently started on metoprolol 25 mg 3 times a day. I'm going to add Imdur 15 mg daily. She's had some sensitivity to medication in the past.  We'll bring her back for early followup in medicine titration with Truitt Merle. If she fails medical therapy, PCI for LAD would be reasonable.  [name]  11/18/2013, 11:51 AM  Assessment / Plan: 1. Known CAD with prior NSTEMI with balloon PCI to the LCX back in 2009 - negative stress echo in 2012 - has had recurrent symptoms and has been recathed - trying to manage medically - I have explained to her the rationale for the Imdur - she is willing to try. Has already stopped the beta blocker. May need to consider Norvasc. Will see back in 3 weeks with Dr. Burt Knack.   2. HTN/white coat syndrome - BP quite high today. Recheck by me is 180/80. She will not let me add any additional medicine. I have explained to her the sequela of untreated HTN. She will monitor her BP at home.  3. HLD - statin intolerance - only on Zetia - rechecking labs today.   4. Mild carotid disease - should have follow up study in one year.   Patient is agreeable to this plan and will call if any problems develop in the interim.   Burtis Junes, RN, [place]  [address] [place]  [place], [place] [address]  [phone]

## 2013-12-11 ENCOUNTER — Telehealth: Payer: Self-pay | Admitting: Cardiovascular Disease

## 2013-12-11 NOTE — Telephone Encounter (Signed)
New message          Pt has a spot on her finger that is very tender? Is this a blood clot or vein problem?

## 2013-12-11 NOTE — Telephone Encounter (Signed)
Spoke with patient who states she has a tender spot on the side of her ring finger that first appeared when she was opening the refrigerator door a few days ago.  Patient called because she wanted to make certain this is not a blood clot or something to be concerned with.  Patient states the site is tender and swollen.  Patient denies trauma to the finger and states to the best of her knowledge it appeared suddenly.  Patient denies redness.  I advised patient that this is not the typical presentation for a blood clot and if symptoms persist or do not resolve after a few days to follow up with her PCP.  Patient verbalized understanding and agreement.

## 2013-12-31 ENCOUNTER — Ambulatory Visit (INDEPENDENT_AMBULATORY_CARE_PROVIDER_SITE_OTHER): Payer: Medicare Other | Admitting: Nurse Practitioner

## 2013-12-31 ENCOUNTER — Encounter: Payer: Self-pay | Admitting: Nurse Practitioner

## 2013-12-31 VITALS — BP 150/80 | HR 99 | Wt 150.1 lb

## 2013-12-31 DIAGNOSIS — E785 Hyperlipidemia, unspecified: Secondary | ICD-10-CM | POA: Diagnosis not present

## 2013-12-31 DIAGNOSIS — I10 Essential (primary) hypertension: Secondary | ICD-10-CM

## 2013-12-31 DIAGNOSIS — I2510 Atherosclerotic heart disease of native coronary artery without angina pectoris: Secondary | ICD-10-CM | POA: Diagnosis not present

## 2013-12-31 NOTE — Patient Instructions (Addendum)
Stop the Imdur  Will get you back to see Dr. Burt Knack for further discussion.   Call the [place] office at [phone] if you have any questions, problems or concerns.

## 2013-12-31 NOTE — Progress Notes (Signed)
Victoria [name] Date of Birth: 07-08-1941 Medical Record #[id number]  History of Present Illness: Victoria [name] is seen back today for a 3 week check. Seen for Dr. Burt Knack. She is a 73 year old female with known CAD - she had NSTEMI in 2009 and noted to have small vessel disease involving the branch of the LCX. She did have balloon PCI at that time. Last functional study back in August of 2012 showed no ischemia.   Other issues include hypothyroidism, HLD with statin intolerance - on Zetia, osteoporosis, mild carotid disease and HTN - with white coat syndrome.   Seen back in February with symptoms felt to be consistent with angina. Referred for cardiac cath. Seen back for post cath visit and very hard to sort out how she was feeling/doing. She said she was doing fine but in the next breath - still having chest pain. Says "but everything was normal". Did not understand what her cath showed. Says she was too "bedridden" from the metoprolol so she stopped taking that.  Stopped the Imdur because "she did not get enough information about it". Was not exercising. She agreed to retry her Imdur.   Comes back today. Here with her husband. She says she continues to not do well. Still in a "fog". Can't concentrate. Not "encouraged to get on a treadmill". Feels "tenderness in her heart". Some sharp pain in the collar bone. Still with significant headache, despite taking at night. More depressed. BP from home a little labile - low of 113/63 to high of 156/98.   Current Outpatient Prescriptions  Medication Sig Dispense Refill  . aspirin 81 MG tablet Take 81 mg by mouth daily.        . Calcium Carb-Cholecalciferol 600-400 MG-UNIT TABS Take 1 tablet by mouth daily.       . clobetasol (TEMOVATE) 0.05 % external solution Apply 1 application topically daily as needed. to scalp.      . Coenzyme Q10 (CO Q-10) 300 MG CAPS Take 1 capsule by mouth daily.      . Desoximetasone (TOPICORT) 0.25 % ointment Apply 1  application topically 2 (two) times daily as needed (itching.). Apply to hands.      . ezetimibe (ZETIA) 10 MG tablet Take 10 mg by mouth daily.      . isosorbide mononitrate (IMDUR) 30 MG 24 hr tablet Take 0.5 tablets (15 mg total) by mouth daily.  30 tablet  6  . levothyroxine (SYNTHROID, LEVOTHROID) 100 MCG tablet Take 1 tablet (100 mcg total) by mouth daily before breakfast.  90 tablet  3  . loratadine (CLARITIN) 10 MG tablet Take 10 mg by mouth daily.      . Multiple Vitamins-Minerals (EYE VITAMINS) TABS Take 1 tablet by mouth daily.        . nitroGLYCERIN (NITROSTAT) 0.4 MG SL tablet Place 1 tablet (0.4 mg total) under the tongue every 5 (five) minutes as needed. For chest pain  25 tablet  11   No current facility-administered medications for this visit.    Allergies  Allergen Reactions  . Shellfish Allergy Hives and Shortness Of Breath  . Lanolin Itching  . Latex Other (See Comments)    Unknown allergy testing showed allergic  . Cephalexin Itching and Rash  . Ciprofloxacin Rash  . Erythromycin Itching and Rash  . Penicillins Itching and Rash  . Propylene Glycol Itching and Rash  . Tetracycline Itching and Rash  . Tetracyclines & Related Itching and Rash    Past  Medical History  Diagnosis Date  . CAD (coronary artery disease)     unspecified site with NSTEMI 2009. Small OM treated with PCA  . Atopic eczema   . Osteoporosis     hx tailbone fx; bisphos >72yr tx, stopped 2011  . Hyperlipidemia     intol statin  . Hypothyroidism   . Essential hypertension, benign     white coat     dr cooper  . Myocardial infarction     2009  . Acute lumbar radiculopathy 04/2012    L L5-S1 s/p decompression surg    Past Surgical History  Procedure Laterality Date  . Appendectomy    . Tonsillectomy    . Carpal tunnel release      bilateral, dorsal release  . Hip surgery      in the 1980s with a staph infection postoperatively  . Laparoscopic hysterectomy    . Cardiac  catheterization      2009  . Lumbar laminectomy/decompression microdiscectomy  04/17/2012    Procedure: LUMBAR LAMINECTOMY/DECOMPRESSION MICRODISCECTOMY 1 LEVEL;  Surgeon: Erline Levine, MD;  Location: [facility];  Service: Neurosurgery;  Laterality: Left;  Left Lumbar five-Sacral one Microdiskectomy    History  Smoking status  . Never Smoker   Smokeless tobacco  . Not on file    History  Alcohol Use  . 0.0 oz/week    Comment: 1 glass of wine daily    Family History  Problem Relation Age of Onset  . Colon cancer Mother 45    1st degree  . Stroke Father 37  . Stroke      Grandfather- grandmother    Review of Systems: The review of systems is per the HPI.  All other systems were reviewed and are negative.  Physical Exam: BP 150/80  Pulse 99  Wt 150 lb 1.9 oz (68.094 kg)  SpO2 99% Patient is alert and in no acute distress. Affect is quite flat. Skin is warm and dry. Color is normal.  HEENT is unremarkable. Normocephalic/atraumatic. PERRL. Sclera are nonicteric. Neck is supple. No masses. No JVD. Lungs are clear. Cardiac exam shows a regular rate and rhythm. Abdomen is soft. Extremities are without edema. Gait and ROM are intact. No gross neurologic deficits noted.  Wt Readings from Last 3 Encounters:  12/31/13 150 lb 1.9 oz (68.094 kg)  12/06/13 147 lb (66.679 kg)  11/18/13 148 lb (67.132 kg)    LABORATORY DATA: Lab Results  Component Value Date   WBC 7.5 11/14/2013   HGB 12.9 11/14/2013   HCT 39.4 11/14/2013   PLT 266.0 11/14/2013   GLUCOSE 81 12/06/2013   CHOL 198 12/06/2013   TRIG 118.0 12/06/2013   HDL 45.60 12/06/2013   LDLCALC 129* 12/06/2013   ALT 22 12/06/2013   AST 20 12/06/2013   NA 139 12/06/2013   K 3.7 12/06/2013   CL 106 12/06/2013   CREATININE 0.7 12/06/2013   BUN 15 12/06/2013   CO2 27 12/06/2013   TSH 0.07* 01/07/2013   INR 1.1* 11/14/2013   HGBA1C  Value: 5.4 (NOTE)   The ADA recommends the following therapeutic goals for glycemic   control related to Hgb A1C  measurement:   Goal of Therapy:   < 7.0% Hgb A1C   Action Suggested:  > 8.0% Hgb A1C   Ref:  Diabetes Care, 53, Suppl. 1, 1999 11/10/2007   Coronary angiography:  Coronary dominance: right  Left mainstem: The left main is moderately calcified. The vessel has 30% distal stenosis.  There is no high-grade disease noted.  Left anterior descending (LAD): The LAD is heavily calcified. The ostium has 20% stenosis. The proximal vessel has heavy calcification with no significant stenosis. At the first septal perforator there is 40-50% stenosis. The second diagonal branch there is 60-70% stenosis. There is a long area of segmental disease from the first perforators through the second diagonal branch. The mid and distal LAD have no significant obstructive disease beyond the second diagonal.  Left circumflex (LCx): The left circumflex is patent. The obtuse marginal at the site of previous balloon angioplasty is patent without significant residual stenosis. There are 4 OM branches with no significant disease the there is moderate circumflex calcification as well  Right coronary artery (RCA): The RCA is moderately calcified. There is mild nonobstructive plaque in the proximal vessel. The mid and distal vessel had no significant disease. The PDA and PLA branches are patent without significant disease.  Left ventriculography: Left ventricular systolic function is normal, LVEF is estimated at 55-65%, there is no significant mitral regurgitation  Final Conclusions:  1. Moderate diffuse mid LAD stenosis with borderline FFR of 0.80  2. Minor nonobstructive disease in the left circumflex and right coronary arteries  3. Normal LV systolic function  Recommendations: The patient has some progression of LAD stenosis compared to her 2009 cath. Angiographically there is moderate effused disease through the midsegment. By pressure wire analysis there is borderline hemodynamic significance. I think medical therapy is indicated for an  initial approach. She was just recently started on metoprolol 25 mg 3 times a day. I'm going to add Imdur 15 mg daily. She's had some sensitivity to medication in the past. We'll bring her back for early followup in medicine titration with Truitt Merle. If she fails medical therapy, PCI for LAD would be reasonable.  [name]  11/18/2013, 11:51 AM   Assessment / Plan:  1. Known CAD with prior NSTEMI with balloon PCI to the LCX back in 2009 - negative stress echo in 2012 - has had recurrent symptoms and has been recathed - trying to manage medically - she is not tolerating her current regimen due to side effects. She is subsequently seen with Dr. Burt Knack - will proceed with attempt at PCI. Stopping Imdur as well. She would need to start Plavix along with her aspirin. With her many medication intolerances, she will need to be on Plavix for at least a month to make sure she is going to tolerate. She needs to understand that she will probably be left with some symptoms. We are not convinced that all of her symptoms are cardiac.   2. HTN/white coat syndrome - fair control at home - needs to get her cuff checked for correlation.   3. HLD - statin intolerance - only on Zetia.   4. Mild carotid disease - should have follow up study in one year.   Will get her back to see Dr. Burt Knack for further discussion. Stop the Imdur today. She is to bring her cuff in for correlation.   Patient is agreeable to this plan and will call if any problems develop in the interim.   Burtis Junes, RN, [place]  [address] [place]  [place], [place] [address]  [phone]

## 2014-01-01 ENCOUNTER — Telehealth: Payer: Self-pay | Admitting: Cardiovascular Disease

## 2014-01-01 NOTE — Telephone Encounter (Signed)
New Message:  Pt is requesting an appt w/ Dr. Burt Knack... Pt would like a call back from the nurse knowing when she can be worked in.

## 2014-01-01 NOTE — Telephone Encounter (Signed)
Routing note to Silas [place], Dr. Antionette Char scheduler for an appointment on 4/17 with Dr. Burt Knack.

## 2014-01-15 ENCOUNTER — Ambulatory Visit (INDEPENDENT_AMBULATORY_CARE_PROVIDER_SITE_OTHER): Payer: Medicare Other | Admitting: Cardiovascular Disease

## 2014-01-15 ENCOUNTER — Encounter: Payer: Self-pay | Admitting: Cardiovascular Disease

## 2014-01-15 VITALS — BP 149/79 | HR 82 | Ht 62.0 in | Wt 151.0 lb

## 2014-01-15 DIAGNOSIS — I2510 Atherosclerotic heart disease of native coronary artery without angina pectoris: Secondary | ICD-10-CM | POA: Diagnosis not present

## 2014-01-15 DIAGNOSIS — I10 Essential (primary) hypertension: Secondary | ICD-10-CM

## 2014-01-15 MED ORDER — AMLODIPINE BESYLATE 5 MG PO TABS: 5.0000 mg | ORAL_TABLET | Freq: Every day | ORAL | Status: DC

## 2014-01-15 NOTE — Patient Instructions (Signed)
Your physician has recommended you make the following change in your medication: START Amlodipine 5mg  take one by mouth daily  You have been referred to the [place] physician wants you to follow-up in: 6 MONTHS with Dr Burt Knack.  You will receive a reminder letter in the mail two months in advance. If you don't receive a letter, please call our office to schedule the follow-up appointment.

## 2014-01-15 NOTE — Progress Notes (Signed)
HPI:  73 year-old woman returning for further discussion of coronary artery disease, chronic angina, hypertension, and hyperlipidemia. The patient had a non-ST MI in 2009 and was noted to have small vessel disease. She did have balloon angioplasty of a small circumflex branch. Her last functional study in 2012 showed no ischemia. She underwent cardiac catheterization this year for progressive anginal symptoms. This demonstrated moderate diffuse mid LAD stenosis. FFR analysis was borderline. She was treated medically. She has been unable to tolerate long-acting nitrates or metoprolol. She also cannot tolerate statin drugs.  She notes that her chest pain has improved. She has not had much trouble in the past few weeks. She denies shortness of breath, edema, or palpitations.  Outpatient Encounter Prescriptions as of 01/15/2014  Medication Sig  . aspirin 81 MG tablet Take 81 mg by mouth daily.    . Calcium Carb-Cholecalciferol 600-400 MG-UNIT TABS Take 1 tablet by mouth daily.   . clobetasol (TEMOVATE) 0.05 % external solution Apply 1 application topically daily as needed. to scalp.  . Coenzyme Q10 (CO Q-10) 300 MG CAPS Take 1 capsule by mouth daily.  . Desoximetasone (TOPICORT) 0.25 % ointment Apply 1 application topically 2 (two) times daily as needed (itching.). Apply to hands.  . ezetimibe (ZETIA) 10 MG tablet Take 10 mg by mouth daily.  Marland Kitchen levothyroxine (SYNTHROID, LEVOTHROID) 100 MCG tablet Take 1 tablet (100 mcg total) by mouth daily before breakfast.  . loratadine (CLARITIN) 10 MG tablet Take 10 mg by mouth daily.  . Multiple Vitamins-Minerals (EYE VITAMINS) TABS Take 1 tablet by mouth daily.    . nitroGLYCERIN (NITROSTAT) 0.4 MG SL tablet Place 1 tablet (0.4 mg total) under the tongue every 5 (five) minutes as needed. For chest pain  . amLODipine (NORVASC) 5 MG tablet Take 1 tablet (5 mg total) by mouth daily.    Allergies  Allergen Reactions  . Shellfish Allergy Hives and Shortness Of  Breath  . Lanolin Itching  . Latex Other (See Comments)    Unknown allergy testing showed allergic  . Cephalexin Itching and Rash  . Ciprofloxacin Rash  . Erythromycin Itching and Rash  . Penicillins Itching and Rash  . Propylene Glycol Itching and Rash  . Tetracycline Itching and Rash  . Tetracyclines & Related Itching and Rash    Past Medical History  Diagnosis Date  . CAD (coronary artery disease)     unspecified site with NSTEMI 2009. Small OM treated with PCA  . Atopic eczema   . Osteoporosis     hx tailbone fx; bisphos >47yr tx, stopped 2011  . Hyperlipidemia     intol statin  . Hypothyroidism   . Essential hypertension, benign     white coat     dr Omir Cooprider  . Myocardial infarction     2009  . Acute lumbar radiculopathy 04/2012    L L5-S1 s/p decompression surg   ROS: Negative except as per HPI  BP 149/79  Pulse 82  Ht 5\' 2"  (1.575 m)  Wt 151 lb (68.493 kg)  BMI 27.61 kg/m2  PHYSICAL EXAM: Physical exam was deferred today as our time was spent in discussion  ASSESSMENT AND PLAN: 1. Coronary artery disease, native vessel with class II angina, typical and atypical symptoms. 2. Hyperlipidemia 3. Hypertension  I spent over 25 minutes with the patient and her husband today. We reviewed her catheterization films in detail. We also reviewed her recent lipid panel. We discussed the pros and cons of PCI. She  does have moderately severe mid LAD stenosis. However, I suspect there is also a component of microvascular angina. Her treatment has been complicated by multiple medication intolerances. She cannot tolerate a statin drug even at low doses because of cognitive dysfunction. She has also had problems with beta blockers and nitrates. She understands that PCI could potentially offer her some improvement in anginal symptoms, but would not reduce her risk of MI or change her cardiac mortality in any way. As her symptoms have improved, she prefers to avoid PCI at this time. She  will return for followup in 6 months. She has agreed to start amlodipine 5 mg daily for better control of her blood pressure. She has also agreed to a lipid clinic referral for further management of her hyperlipidemia. She has been able to tolerate zetia, but her LDL is still above goal.  [name] 01/15/2014 1:54 PM

## 2014-01-17 ENCOUNTER — Ambulatory Visit: Payer: Medicare Other | Admitting: Cardiovascular Disease

## 2014-01-21 ENCOUNTER — Ambulatory Visit (INDEPENDENT_AMBULATORY_CARE_PROVIDER_SITE_OTHER): Payer: Medicare Other | Admitting: Pharmacist

## 2014-01-21 VITALS — Wt 150.0 lb

## 2014-01-21 DIAGNOSIS — I2510 Atherosclerotic heart disease of native coronary artery without angina pectoris: Secondary | ICD-10-CM

## 2014-01-21 DIAGNOSIS — E785 Hyperlipidemia, unspecified: Secondary | ICD-10-CM

## 2014-01-21 DIAGNOSIS — Z79899 Other long term (current) drug therapy: Secondary | ICD-10-CM

## 2014-01-21 MED ORDER — PITAVASTATIN CALCIUM 2 MG PO TABS: 2.0000 mg | ORAL_TABLET | Freq: Every day | ORAL | Status: DC

## 2014-01-21 MED ORDER — VITAMIN D 50 MCG (2000 UT) PO CAPS: 2000.0000 [IU] | ORAL_CAPSULE | Freq: Every day | ORAL | Status: AC

## 2014-01-21 NOTE — Patient Instructions (Addendum)
1.  Start taking vitamin D 2,000 units once daily in addition to what you take now. 2.  Start taking Livalo 1 mg (1/2 of 2 mg pill).  Take this in the evening. 3.  I would like you to start using your treadmill, but wait for at least a month after you start Livalo.   4.  Diet - limit fried and processed foods.   5.  Recheck cholesterol 3 months (04/22/14) and see Ysidro Evert two days later 04/24/14 at 9:00 am.

## 2014-01-21 NOTE — Assessment & Plan Note (Addendum)
Discussed therapeutic options with patient given her h/o CAD.  Given she has failed 3 statins so far, will have her start with Livalo 1 mg qd (gave samples of Livalo 2 mg to start).  She will call if she develops side effects with this, at which time we could try pravastatin 10 mg qhs.  We did discuss PCSK-9 inhibitor / study today as well.  If she can't tolerate either of these 2 statin options, will try to get her enrolled.  Her MI was > 5 years ago, and non-obstructive disease + elevated CPR or low HDL may also get her in.  May need to check a CRP in future if want to try and get her into study.  Given her Vitamin D was barely at goal in the past, will add Vitamin D 2,000 units daily to hopefully improve likelihood of tolerating statin.  She is to start using treadmill 4 weeks after starting statin as well.  Recheck lipid/liver in 3 months and see me 2 days later. Plan: 1.  Start taking vitamin D 2,000 units once daily in addition to what you take now. 2.  Start taking Livalo 1 mg (1/2 of 2 mg pill).  Take this in the evening. 3.  I would like you to start using your treadmill, but wait for at least a month after you start Livalo.   4.  Diet - limit fried and processed foods.   5.  Recheck cholesterol 3 months (04/22/14) and see Ysidro Evert two days later 04/24/14 at 9:00 am.

## 2014-01-21 NOTE — Progress Notes (Signed)
Patient is a pleasant 73 y.o. WF referred to lipid clinic by Dr. Burt Knack given h/o CAD and failed multiple statins.  She has taken lipitor, Zocor, and Crestor in the past, and failed all due to side effects.  She is currently taking Zetia 10 mg qd without complaint.  She had an MI in 2009, and recent cath in 11/2013 showed non-obstructive CAD of 2 vessels.  Her most recent LDL was 129 mg/dL while on Zetia daily.  Patient eats a relatively healthy diet, and does all her daily chores, but doesn't do any regular exercise.  She tells me she was told she had fibromyalgia in the past, but she hasn't been sore in years, and is curious if this was ever a real diagnosis.  Her Vitamin D in 03/2012 was 39.  She is taking Ca+D and a MVI.    RF:  CAD, HTN, age, low HDL - LDL goal < 70, non-HDL goal < 100 Meds:  Zetia 10 mg qd, Co-Q 10 300 mg qd Intolerant:  Crestor 5-10 mg qd (memory impairment); Lipitor (muscle aches); Zocor (muscle aches)  Social history:  Drinks 1 glass of wine per night.  Doesn't use tobacco products.   Diet:  Eats a relatively low fat diet already.  Breakfast is typically cereal.  Lunch is a sandwich.  Dinner is typically a vegetable, salad, and meat.  Eats out maybe twice weekly.  Eats red meat twice per week.  Eats a banana or fruit for dessert in the evenings.  Drinks hot tea daily.  Doesn't drink soda. Exercise:  Patient does all her own yard work.  She doesn't do any regular aerobic exercise currently.  She has a treadmill and is willing to start using it.  Labs:   12/2013:  TC 198, LDL 129, HDL 46, TG 118 (Zetia 10 mg qd only)  Current Outpatient Prescriptions  Medication Sig Dispense Refill  . amLODipine (NORVASC) 5 MG tablet Take 1 tablet (5 mg total) by mouth daily.  90 tablet  3  . aspirin 81 MG tablet Take 81 mg by mouth daily.        . Calcium Carb-Cholecalciferol 600-400 MG-UNIT TABS Take 1 tablet by mouth daily.       . clobetasol (TEMOVATE) 0.05 % external solution Apply 1  application topically daily as needed. to scalp.      . Coenzyme Q10 (CO Q-10) 300 MG CAPS Take 1 capsule by mouth daily.      . Desoximetasone (TOPICORT) 0.25 % ointment Apply 1 application topically 2 (two) times daily as needed (itching.). Apply to hands.      . ezetimibe (ZETIA) 10 MG tablet Take 10 mg by mouth daily.      Marland Kitchen levothyroxine (SYNTHROID, LEVOTHROID) 100 MCG tablet Take 1 tablet (100 mcg total) by mouth daily before breakfast.  90 tablet  3  . loratadine (CLARITIN) 10 MG tablet Take 10 mg by mouth daily.      . Multiple Vitamins-Minerals (EYE VITAMINS) TABS Take 1 tablet by mouth daily.        . nitroGLYCERIN (NITROSTAT) 0.4 MG SL tablet Place 1 tablet (0.4 mg total) under the tongue every 5 (five) minutes as needed. For chest pain  25 tablet  11   No current facility-administered medications for this visit.   Allergies  Allergen Reactions  . Shellfish Allergy Hives and Shortness Of Breath  . Lanolin Itching  . Latex Other (See Comments)    Unknown allergy testing showed allergic  .  Statins     Simvastatin, Lipitor (both caused muscle aches); Crestor 5 mg and 10 mg daily (memory impairment)  . Cephalexin Itching and Rash  . Ciprofloxacin Rash  . Erythromycin Itching and Rash  . Penicillins Itching and Rash  . Propylene Glycol Itching and Rash  . Tetracycline Itching and Rash  . Tetracyclines & Related Itching and Rash   Family History  Problem Relation Age of Onset  . Colon cancer Mother 49    1st degree  . Stroke Father 52  . Stroke      Grandfather- grandmother

## 2014-01-22 ENCOUNTER — Other Ambulatory Visit: Payer: Self-pay

## 2014-01-22 DIAGNOSIS — I10 Essential (primary) hypertension: Secondary | ICD-10-CM

## 2014-01-22 MED ORDER — AMLODIPINE BESYLATE 5 MG PO TABS: 5.0000 mg | ORAL_TABLET | Freq: Every day | ORAL | Status: DC

## 2014-01-22 MED ORDER — EZETIMIBE 10 MG PO TABS: 10.0000 mg | ORAL_TABLET | Freq: Every day | ORAL | Status: DC

## 2014-01-23 ENCOUNTER — Telehealth: Payer: Self-pay | Admitting: Pharmacist

## 2014-01-23 ENCOUNTER — Encounter: Payer: Self-pay | Admitting: Internal Medicine

## 2014-01-23 ENCOUNTER — Other Ambulatory Visit (INDEPENDENT_AMBULATORY_CARE_PROVIDER_SITE_OTHER): Payer: Medicare Other

## 2014-01-23 ENCOUNTER — Ambulatory Visit (INDEPENDENT_AMBULATORY_CARE_PROVIDER_SITE_OTHER): Payer: Medicare Other | Admitting: Internal Medicine

## 2014-01-23 VITALS — BP 130/78 | HR 80 | Temp 97.6°F | Wt 150.8 lb

## 2014-01-23 DIAGNOSIS — E785 Hyperlipidemia, unspecified: Secondary | ICD-10-CM

## 2014-01-23 DIAGNOSIS — I2510 Atherosclerotic heart disease of native coronary artery without angina pectoris: Secondary | ICD-10-CM | POA: Diagnosis not present

## 2014-01-23 DIAGNOSIS — E039 Hypothyroidism, unspecified: Secondary | ICD-10-CM

## 2014-01-23 DIAGNOSIS — M810 Age-related osteoporosis without current pathological fracture: Secondary | ICD-10-CM | POA: Diagnosis not present

## 2014-01-23 DIAGNOSIS — I10 Essential (primary) hypertension: Secondary | ICD-10-CM

## 2014-01-23 DIAGNOSIS — Z0000 Encounter for general adult medical examination without abnormal findings: Secondary | ICD-10-CM | POA: Diagnosis not present

## 2014-01-23 DIAGNOSIS — Z79899 Other long term (current) drug therapy: Secondary | ICD-10-CM | POA: Diagnosis not present

## 2014-01-23 LAB — HEPATIC FUNCTION PANEL
ALT: 21 U/L (ref 0–35)
AST: 22 U/L (ref 0–37)
Albumin: 4 g/dL (ref 3.5–5.2)
Alkaline Phosphatase: 70 U/L (ref 39–117)
BILIRUBIN TOTAL: 0.7 mg/dL (ref 0.3–1.2)
Bilirubin, Direct: 0.1 mg/dL (ref 0.0–0.3)
Total Protein: 7.5 g/dL (ref 6.0–8.3)

## 2014-01-23 LAB — LIPID PANEL
CHOL/HDL RATIO: 4
Cholesterol: 171 mg/dL (ref 0–200)
HDL: 45 mg/dL (ref >39.00)
LDL CALC: 105 mg/dL — AB (ref 0–99)
Triglycerides: 107 mg/dL (ref 0.0–149.0)
VLDL: 21.4 mg/dL (ref 0.0–40.0)

## 2014-01-23 LAB — TSH: TSH: 0.04 u[IU]/mL — ABNORMAL LOW (ref 0.35–5.50)

## 2014-01-23 MED ORDER — PRAVASTATIN SODIUM 10 MG PO TABS: 10.0000 mg | ORAL_TABLET | Freq: Every day | ORAL | Status: DC

## 2014-01-23 MED ORDER — CLOBETASOL PROPIONATE 0.05 % EX SOLN: 1.0000 "application " | Freq: Every day | CUTANEOUS | Status: DC | PRN

## 2014-01-23 MED ORDER — EPINEPHRINE 0.3 MG/0.3ML IJ SOAJ: 0.3000 mg | Freq: Once | INTRAMUSCULAR | Status: DC

## 2014-01-23 NOTE — Progress Notes (Signed)
Pre visit review using our clinic review tool, if applicable. No additional management support is needed unless otherwise documented below in the visit note. 

## 2014-01-23 NOTE — Progress Notes (Signed)
Subjective:    Patient ID: Victoria [name], female    DOB: [birth year], 73 y.o.   MRN: [id number]  HPI   Here for medicare wellness  Diet: heart healthy Physical activity: sedentary Depression/mood screen: negative Hearing: intact to whispered voice Visual acuity: grossly normal, performs annual eye exam  ADLs: capable Fall risk: none Home safety: good Cognitive evaluation: intact to orientation, naming, recall and repetition EOL planning: adv directives, full code/ I agree  I have personally reviewed and have noted 1. The patient's medical and social history 2. Their use of alcohol, tobacco or illicit drugs 3. Their current medications and supplements 4. The patient's functional ability including ADL's, fall risks, home safety risks and hearing or visual impairment. 5. Diet and physical activities 6. Evidence for depression or mood disorders  Also reviewed chronic medical issues and interval medical events  Past Medical History  Diagnosis Date  . CAD (coronary artery disease)     unspecified site with NSTEMI 2009. Small OM treated with PCA  . Atopic eczema   . Osteoporosis     hx tailbone fx; bisphos >85yr tx, stopped 2011  . Hyperlipidemia     intol statin  . Hypothyroidism   . Essential hypertension, benign     white coat     dr cooper  . Myocardial infarction     2009  . Acute lumbar radiculopathy 04/2012    L L5-S1 s/p decompression surg   Family History  Problem Relation Age of Onset  . Colon cancer Mother 70    1st degree  . Stroke Father 40  . Stroke      Grandfather- grandmother   History  Substance Use Topics  . Smoking status: Never Smoker   . Smokeless tobacco: Not on file  . Alcohol Use: 0.0 oz/week     Comment: 1 glass of wine daily    Review of Systems  Constitutional: Positive for fatigue (mild). Negative for unexpected weight change.  Respiratory: Positive for shortness of breath (chronic DOE, unchanged). Negative for cough and  wheezing.   Cardiovascular: Positive for chest pain (chronic, unchanged). Negative for palpitations and leg swelling.  Gastrointestinal: Negative for nausea, abdominal pain, diarrhea and constipation.  Neurological: Negative for dizziness, weakness, light-headedness and headaches.  Psychiatric/Behavioral: Negative for dysphoric mood. The patient is not nervous/anxious.   All other systems reviewed and are negative.      Objective:   Physical Exam  BP 130/78  Pulse 80  Temp(Src) 97.6 F (36.4 C) (Oral)  Wt 150 lb 12.8 oz (68.402 kg)  SpO2 97% Wt Readings from Last 3 Encounters:  01/23/14 150 lb 12.8 oz (68.402 kg)  01/21/14 150 lb (68.04 kg)  01/15/14 151 lb (68.493 kg)   Constitutional: She appears well-developed and well-nourished. No distress.  Neck: Normal range of motion. Neck supple. No JVD present. No thyromegaly present.  Cardiovascular: Normal rate, regular rhythm and normal heart sounds.  No murmur heard. No BLE edema. Pulmonary/Chest: Effort normal and breath sounds normal. No respiratory distress. She has no wheezes.  Psychiatric: She has a normal mood and affect. Her behavior is normal. Judgment and thought content normal.   Lab Results  Component Value Date   WBC 7.5 11/14/2013   HGB 12.9 11/14/2013   HCT 39.4 11/14/2013   PLT 266.0 11/14/2013   GLUCOSE 81 12/06/2013   CHOL 198 12/06/2013   TRIG 118.0 12/06/2013   HDL 45.60 12/06/2013   LDLCALC 129* 12/06/2013   ALT 22 12/06/2013  AST 20 12/06/2013   NA 139 12/06/2013   K 3.7 12/06/2013   CL 106 12/06/2013   CREATININE 0.7 12/06/2013   BUN 15 12/06/2013   CO2 27 12/06/2013   TSH 0.07* 01/07/2013   INR 1.1* 11/14/2013   HGBA1C  Value: 5.4 (NOTE)   The ADA recommends the following therapeutic goals for glycemic   control related to Hgb A1C measurement:   Goal of Therapy:   < 7.0% Hgb A1C   Action Suggested:  > 8.0% Hgb A1C   Ref:  Diabetes Care, 22, Suppl. 1, 1999 11/10/2007    Dg Chest 2 View  11/08/2013   CLINICAL DATA:  Chest pain,  shortness of breath  EXAM: CHEST  2 VIEW  COMPARISON:  None.  FINDINGS: Chronic interstitial markings. No focal consolidation. No pleural effusion or pneumothorax.  Heart is normal in size.  Degenerative changes of the visualized thoracolumbar spine.  IMPRESSION: No evidence of acute cardiopulmonary disease.   Electronically Signed   By: Julian Hy M.D.   On: 11/08/2013 13:14       Assessment & Plan:   AWV/v70.0 - Today patient counseled on age appropriate routine health concerns for screening and prevention, each reviewed and up to date or declined. Immunizations reviewed and up to date or declined. Labs ordered and reviewed. Risk factors for depression reviewed and negative. Hearing function and visual acuity are intact. ADLs screened and addressed as needed. Functional ability and level of safety reviewed and appropriate. Education, counseling and referrals performed based on assessed risks today. Patient provided with a copy of personalized plan for preventive services.

## 2014-01-23 NOTE — Patient Instructions (Addendum)
It was good to see you today.  We have reviewed your prior records including labs and tests today  Health Maintenance reviewed - all recommended immunizations and age-appropriate screenings are up-to-date. Okay to delay on mammogram and colonoscopy screening at this time  Will arrange for a bone density scan. you will be called with these results once reviewed  Test(s) ordered today. Your results will be released to [place] (or called to you) after review, usually within 72hours after test completion. If any changes need to be made, you will be notified at that same time.  Medications reviewed and updated, no changes recommended at this time. Refill on medication(s) as discussed today.  Please schedule followup in 12 months for annual exam and labs, call sooner if problems.  Health Maintenance, Female A healthy lifestyle and preventative care can promote health and wellness.  Maintain regular health, dental, and eye exams.  Eat a healthy diet. Foods like vegetables, fruits, whole grains, low-fat dairy products, and lean protein foods contain the nutrients you need without too many calories. Decrease your intake of foods high in solid fats, added sugars, and salt. Get information about a proper diet from your caregiver, if necessary.  Regular physical exercise is one of the most important things you can do for your health. Most adults should get at least 150 minutes of moderate-intensity exercise (any activity that increases your heart rate and causes you to sweat) each week. In addition, most adults need muscle-strengthening exercises on 2 or more days a week.   Maintain a healthy weight. The body mass index (BMI) is a screening tool to identify possible weight problems. It provides an estimate of body fat based on height and weight. Your caregiver can help determine your BMI, and can help you achieve or maintain a healthy weight. For adults 20 years and older:  A BMI below 18.5 is  considered underweight.  A BMI of 18.5 to 24.9 is normal.  A BMI of 25 to 29.9 is considered overweight.  A BMI of 30 and above is considered obese.  Maintain normal blood lipids and cholesterol by exercising and minimizing your intake of saturated fat. Eat a balanced diet with plenty of fruits and vegetables. Blood tests for lipids and cholesterol should begin at age 77 and be repeated every 5 years. If your lipid or cholesterol levels are high, you are over 50, or you are a high risk for heart disease, you may need your cholesterol levels checked more frequently.Ongoing high lipid and cholesterol levels should be treated with medicines if diet and exercise are not effective.  If you smoke, find out from your caregiver how to quit. If you do not use tobacco, do not start.  Lung cancer screening is recommended for adults aged 32 80 years who are at high risk for developing lung cancer because of a history of smoking. Yearly low-dose computed tomography (CT) is recommended for people who have at least a 30-pack-year history of smoking and are a current smoker or have quit within the past 15 years. A pack year of smoking is smoking an average of 1 pack of cigarettes a day for 1 year (for example: 1 pack a day for 30 years or 2 packs a day for 15 years). Yearly screening should continue until the smoker has stopped smoking for at least 15 years. Yearly screening should also be stopped for people who develop a health problem that would prevent them from having lung cancer treatment.  If you are  pregnant, do not drink alcohol. If you are breastfeeding, be very cautious about drinking alcohol. If you are not pregnant and choose to drink alcohol, do not exceed 1 drink per day. One drink is considered to be 12 ounces (355 mL) of beer, 5 ounces (148 mL) of wine, or 1.5 ounces (44 mL) of liquor.  Avoid use of street drugs. Do not share needles with anyone. Ask for help if you need support or instructions  about stopping the use of drugs.  High blood pressure causes heart disease and increases the risk of stroke. Blood pressure should be checked at least every 1 to 2 years. Ongoing high blood pressure should be treated with medicines, if weight loss and exercise are not effective.  If you are 2 to 73 years old, ask your caregiver if you should take aspirin to prevent strokes.  Diabetes screening involves taking a blood sample to check your fasting blood sugar level. This should be done once every 3 years, after age 75, if you are within normal weight and without risk factors for diabetes. Testing should be considered at a younger age or be carried out more frequently if you are overweight and have at least 1 risk factor for diabetes.  Breast cancer screening is essential preventative care for women. You should practice "breast self-awareness." This means understanding the normal appearance and feel of your breasts and may include breast self-examination. Any changes detected, no matter how small, should be reported to a caregiver. Women in their 33s and 30s should have a clinical breast exam (CBE) by a caregiver as part of a regular health exam every 1 to 3 years. After age 65, women should have a CBE every year. Starting at age 61, women should consider having a mammogram (breast X-ray) every year. Women who have a family history of breast cancer should talk to their caregiver about genetic screening. Women at a high risk of breast cancer should talk to their caregiver about having an MRI and a mammogram every year.  Breast cancer gene (BRCA)-related cancer risk assessment is recommended for women who have family members with BRCA-related cancers. BRCA-related cancers include breast, ovarian, tubal, and peritoneal cancers. Having family members with these cancers may be associated with an increased risk for harmful changes (mutations) in the breast cancer genes BRCA1 and BRCA2. Results of the assessment  will determine the need for genetic counseling and BRCA1 and BRCA2 testing.  The Pap test is a screening test for cervical cancer. Women should have a Pap test starting at age 49. Between ages 44 and 93, Pap tests should be repeated every 2 years. Beginning at age 68, you should have a Pap test every 3 years as long as the past 3 Pap tests have been normal. If you had a hysterectomy for a problem that was not cancer or a condition that could lead to cancer, then you no longer need Pap tests. If you are between ages 43 and 19, and you have had normal Pap tests going back 10 years, you no longer need Pap tests. If you have had past treatment for cervical cancer or a condition that could lead to cancer, you need Pap tests and screening for cancer for at least 20 years after your treatment. If Pap tests have been discontinued, risk factors (such as a new sexual partner) need to be reassessed to determine if screening should be resumed. Some women have medical problems that increase the chance of getting cervical cancer. In these  cases, your caregiver may recommend more frequent screening and Pap tests.  The human papillomavirus (HPV) test is an additional test that may be used for cervical cancer screening. The HPV test looks for the virus that can cause the cell changes on the cervix. The cells collected during the Pap test can be tested for HPV. The HPV test could be used to screen women aged 25 years and older, and should be used in women of any age who have unclear Pap test results. After the age of 60, women should have HPV testing at the same frequency as a Pap test.  Colorectal cancer can be detected and often prevented. Most routine colorectal cancer screening begins at the age of 34 and continues through age 31. However, your caregiver may recommend screening at an earlier age if you have risk factors for colon cancer. On a yearly basis, your caregiver may provide home test kits to check for hidden blood  in the stool. Use of a small camera at the end of a tube, to directly examine the colon (sigmoidoscopy or colonoscopy), can detect the earliest forms of colorectal cancer. Talk to your caregiver about this at age 22, when routine screening begins. Direct examination of the colon should be repeated every 5 to 10 years through age 96, unless early forms of pre-cancerous polyps or small growths are found.  Hepatitis C blood testing is recommended for all people born from [birth year] through 1965 and any individual with known risks for hepatitis C.  Practice safe sex. Use condoms and avoid high-risk sexual practices to reduce the spread of sexually transmitted infections (STIs). Sexually active women aged 30 and younger should be checked for Chlamydia, which is a common sexually transmitted infection. Older women with new or multiple partners should also be tested for Chlamydia. Testing for other STIs is recommended if you are sexually active and at increased risk.  Osteoporosis is a disease in which the bones lose minerals and strength with aging. This can result in serious bone fractures. The risk of osteoporosis can be identified using a bone density scan. Women ages 18 and over and women at risk for fractures or osteoporosis should discuss screening with their caregivers. Ask your caregiver whether you should be taking a calcium supplement or vitamin D to reduce the rate of osteoporosis.  Menopause can be associated with physical symptoms and risks. Hormone replacement therapy is available to decrease symptoms and risks. You should talk to your caregiver about whether hormone replacement therapy is right for you.  Use sunscreen. Apply sunscreen liberally and repeatedly throughout the day. You should seek shade when your shadow is shorter than you. Protect yourself by wearing long sleeves, pants, a wide-brimmed hat, and sunglasses year round, whenever you are outdoors.  Notify your caregiver of new moles or  changes in moles, especially if there is a change in shape or color. Also notify your caregiver if a mole is larger than the size of a pencil eraser.  Stay current with your immunizations. Document Released: 04/04/2011 Document Revised: 01/14/2013 Document Reviewed: 04/04/2011 [facility] Patient Information 2014 [place].

## 2014-01-23 NOTE — Assessment & Plan Note (Signed)
History of CAD, historically intolerant of statin therapy Following with lipid clinic for same, has begun trial Livalo Continue Zetia Interval history reviewed, no changes recommended

## 2014-01-23 NOTE — Progress Notes (Signed)
[place] [id number] 01/23/2014  Chief Complaint  Patient presents with  . Follow-up    On thyroid    Subjective  HPI  Expecting letter.  follow up for annual a letter. Here for a thyroid check. Needs prescriptions renewed. She has heart disease. It is important to keep thyroid regulated. Refill for Epi pen and Clobetasol. Up to date on labs. TSH. Needs Vit D checked. [facility] to [facility]. Send a 90 day supply in the mail. Insuarance will pay 50 for 90 days. Has CAD...chronic artery disease. No palpations, yes SOB (walking up stairs exercising), occasional chest pain comes on at various times. Had a catheterization put in. Medications have caused problems. Endure=terrible ha's. Takes amlodipine for HTN...tolerated. Itchy mouth and throat with livalo for cholesterol. Due for bone density scan (DEXA). BP is lowering since amlodipine X 1 week by cardiologist. 130/78. Weight gain: 5 pounds in 6 months. No night sweats or insomnia. No tachycardia. Is having depression and fatigue.   Past Medical History  Diagnosis Date  . CAD (coronary artery disease)     unspecified site with NSTEMI 2009. Small OM treated with PCA  . Atopic eczema   . Osteoporosis     hx tailbone fx; bisphos >87yr tx, stopped 2011  . Hyperlipidemia     intol statin  . Hypothyroidism   . Essential hypertension, benign     white coat     dr cooper  . Myocardial infarction     2009  . Acute lumbar radiculopathy 04/2012    L L5-S1 s/p decompression surg    Past Surgical History  Procedure Laterality Date  . Appendectomy    . Tonsillectomy    . Carpal tunnel release      bilateral, dorsal release  . Hip surgery      in the 1980s with a staph infection postoperatively  . Laparoscopic hysterectomy    . Cardiac catheterization      2009  . Lumbar laminectomy/decompression microdiscectomy  04/17/2012    Procedure: LUMBAR LAMINECTOMY/DECOMPRESSION MICRODISCECTOMY 1 LEVEL;  Surgeon: Erline Levine,  MD;  Location: [facility];  Service: Neurosurgery;  Laterality: Left;  Left Lumbar five-Sacral one Microdiskectomy    Family History  Problem Relation Age of Onset  . Colon cancer Mother 4    1st degree  . Stroke Father 33  . Stroke      Grandfather- grandmother    History  Substance Use Topics  . Smoking status: Never Smoker   . Smokeless tobacco: Not on file  . Alcohol Use: 0.0 oz/week     Comment: 1 glass of wine daily    Current Outpatient Prescriptions on File Prior to Visit  Medication Sig Dispense Refill  . amLODipine (NORVASC) 5 MG tablet Take 1 tablet (5 mg total) by mouth daily.  90 tablet  3  . aspirin 81 MG tablet Take 81 mg by mouth daily.        . Calcium Carb-Cholecalciferol 600-400 MG-UNIT TABS Take 1 tablet by mouth daily.       . Cholecalciferol (VITAMIN D) 2000 UNITS CAPS Take 1 capsule (2,000 Units total) by mouth daily.  30 capsule    . clobetasol (TEMOVATE) 0.05 % external solution Apply 1 application topically daily as needed. to scalp.      . Coenzyme Q10 (CO Q-10) 300 MG CAPS Take 1 capsule by mouth daily.      . Desoximetasone (TOPICORT) 0.25 % ointment Apply 1 application topically 2 (  two) times daily as needed (itching.). Apply to hands.      . ezetimibe (ZETIA) 10 MG tablet Take 1 tablet (10 mg total) by mouth daily.  90 tablet  3  . levothyroxine (SYNTHROID, LEVOTHROID) 100 MCG tablet Take 1 tablet (100 mcg total) by mouth daily before breakfast.  90 tablet  3  . loratadine (CLARITIN) 10 MG tablet Take 10 mg by mouth daily.      . Multiple Vitamins-Minerals (EYE VITAMINS) TABS Take 1 tablet by mouth daily.        . nitroGLYCERIN (NITROSTAT) 0.4 MG SL tablet Place 1 tablet (0.4 mg total) under the tongue every 5 (five) minutes as needed. For chest pain  25 tablet  11  . Pitavastatin Calcium (LIVALO) 2 MG TABS Take 1 tablet (2 mg total) by mouth at bedtime.  30 tablet     No current facility-administered medications on file prior to visit.      Allergies: Allergies  Allergen Reactions  . Shellfish Allergy Hives and Shortness Of Breath  . Lanolin Itching  . Latex Other (See Comments)    Unknown allergy testing showed allergic  . Statins     Simvastatin, Lipitor (both caused muscle aches); Crestor 5 mg and 10 mg daily (memory impairment)  . Cephalexin Itching and Rash  . Ciprofloxacin Rash  . Erythromycin Itching and Rash  . Penicillins Itching and Rash  . Propylene Glycol Itching and Rash  . Tetracycline Itching and Rash  . Tetracyclines & Related Itching and Rash    ROS     Objective  Filed Vitals:   01/23/14 1106  BP: 130/78  Pulse: 80  Temp: 97.6 F (36.4 C)  TempSrc: Oral  Weight: 150 lb 12.8 oz (68.402 kg)  SpO2: 97%    Physical Exam  BP Readings from Last 3 Encounters:  01/23/14 130/78  01/15/14 149/79  12/31/13 150/80    Wt Readings from Last 3 Encounters:  01/23/14 150 lb 12.8 oz (68.402 kg)  01/21/14 150 lb (68.04 kg)  01/15/14 151 lb (68.493 kg)    Lab Results  Component Value Date   WBC 7.5 11/14/2013   HGB 12.9 11/14/2013   HCT 39.4 11/14/2013   PLT 266.0 11/14/2013   GLUCOSE 81 12/06/2013   CHOL 198 12/06/2013   TRIG 118.0 12/06/2013   HDL 45.60 12/06/2013   LDLCALC 129* 12/06/2013   ALT 22 12/06/2013   AST 20 12/06/2013   NA 139 12/06/2013   K 3.7 12/06/2013   CL 106 12/06/2013   CREATININE 0.7 12/06/2013   BUN 15 12/06/2013   CO2 27 12/06/2013   TSH 0.07* 01/07/2013   INR 1.1* 11/14/2013   HGBA1C  Value: 5.4 (NOTE)   The ADA recommends the following therapeutic goals for glycemic   control related to Hgb A1C measurement:   Goal of Therapy:   < 7.0% Hgb A1C   Action Suggested:  > 8.0% Hgb A1C   Ref:  Diabetes Care, 22, Suppl. 1, 1999 11/10/2007    Dg Chest 2 View  11/08/2013   CLINICAL DATA:  Chest pain, shortness of breath  EXAM: CHEST  2 VIEW  COMPARISON:  None.  FINDINGS: Chronic interstitial markings. No focal consolidation. No pleural effusion or pneumothorax.  Heart is normal in size.  Degenerative  changes of the visualized thoracolumbar spine.  IMPRESSION: No evidence of acute cardiopulmonary disease.   Electronically Signed   By: Julian Hy M.D.   On: 11/08/2013 13:14  Assessment and Plan  No Follow-up on file. Berenice Bouton, Student-PA

## 2014-01-23 NOTE — Assessment & Plan Note (Signed)
Last DEXAs reviewed and hx coccyx fx On fosamax for >43yr - took drug holiday and stopped med summer 2011 due to concern for brittle bone follow up DEXA 11/2011 without significant change from 2010 Recheck now declines Prolia - reconsider fosamax if future decline Reviewed need for adequate Vit D and Ca intake - check vit D now

## 2014-01-23 NOTE — Assessment & Plan Note (Signed)
Lab Results  Component Value Date   TSH 0.07* 01/07/2013  reduced synthroid 125>>112 on 11/2011 Recheck now

## 2014-01-23 NOTE — Assessment & Plan Note (Signed)
BP Readings from Last 3 Encounters:  01/23/14 130/78  01/15/14 149/79  12/31/13 150/80   Patient describes severe episodes of whitecoat hypertension Recently begun on amlodipine by cardiology because of coronary artery disease history The current medical regimen is effective;  continue present plan and medications.

## 2014-01-23 NOTE — Telephone Encounter (Signed)
Patient calls and states that she started Livalo 1 mg two days ago, and soon after she takes this pill she gets an itching sensation in her mouth and throat.  After the first night this happened, she decided to take the pill by itself at a different time of the day the following day, but the itching issue occurred again soon after taking it.  She took Benadryl both times, and soon after taking Benadryl, the itching went away.  Patient also started vitamin D two days ago.  Patient wants to know what to do now.  RF: CAD, HTN, age, low HDL - LDL goal < 70, non-HDL goal < 100  Meds: Zetia 10 mg qd, Co-Q 10 300 mg qd  Intolerant: Crestor 5-10 mg qd (memory impairment); Lipitor (muscle aches); Zocor (muscle aches), Livalo (itching in her throat)  I advised patient to discontinue Livalo since it has occurred two different times, and this was resolved by Benadryl.  Possible allergic reaction.  She is to continue Vitamin D though, and will call me back if the itching in her throat happens again this week.  Patient instructed to start pravastatin 10 mg qhs in 1 week from today as we discussed during lipid clinic visit on 01/21/14.  Patient will recheck blood work 04/2014.  If she fails pravastatin 10 mg qhs, will try to enroll her into SPIRE-2.  Patient agreeable to plan.

## 2014-01-24 LAB — VITAMIN D 25 HYDROXY (VIT D DEFICIENCY, FRACTURES): Vit D, 25-Hydroxy: 36 ng/mL (ref 30–89)

## 2014-01-24 MED ORDER — LEVOTHYROXINE SODIUM 88 MCG PO TABS: 88.0000 ug | ORAL_TABLET | Freq: Every day | ORAL | Status: DC

## 2014-01-24 NOTE — Addendum Note (Signed)
Addended by: Gwendolyn Grant A on: 01/24/2014 10:36 AM   Modules accepted: Orders

## 2014-01-29 ENCOUNTER — Telehealth: Payer: Self-pay | Admitting: *Deleted

## 2014-01-29 NOTE — Telephone Encounter (Signed)
Explained to patient that pravastatin is the same medication as pravachol that we discussed last week.  She agrees to take pravastatin 10 mg qhs and call me if she develops side effects.  Would enroll into SPIRE-2 if this occurs.

## 2014-01-29 NOTE — Telephone Encounter (Signed)
Patient called stating that she had received a 90 day supply of pravastatin and she doesn't tolerate statins. Please advise. Thanks, MI

## 2014-02-03 ENCOUNTER — Ambulatory Visit: Payer: Medicare Other | Admitting: Internal Medicine

## 2014-02-07 ENCOUNTER — Ambulatory Visit (INDEPENDENT_AMBULATORY_CARE_PROVIDER_SITE_OTHER)
Admission: RE | Admit: 2014-02-07 | Discharge: 2014-02-07 | Disposition: A | Discharge status: Home / Self Care | Payer: Medicare Other | Source: Ambulatory Visit | Attending: Internal Medicine | Admitting: Internal Medicine

## 2014-02-07 DIAGNOSIS — M810 Age-related osteoporosis without current pathological fracture: Secondary | ICD-10-CM | POA: Diagnosis not present

## 2014-02-10 ENCOUNTER — Encounter: Payer: Self-pay | Admitting: Internal Medicine

## 2014-02-25 DIAGNOSIS — H2510 Age-related nuclear cataract, unspecified eye: Secondary | ICD-10-CM | POA: Diagnosis not present

## 2014-02-25 DIAGNOSIS — H353190 Nonexudative age-related macular degeneration, unspecified eye, stage unspecified: Secondary | ICD-10-CM | POA: Diagnosis not present

## 2014-02-25 DIAGNOSIS — H40019 Open angle with borderline findings, low risk, unspecified eye: Secondary | ICD-10-CM | POA: Diagnosis not present

## 2014-02-25 DIAGNOSIS — H524 Presbyopia: Secondary | ICD-10-CM | POA: Diagnosis not present

## 2014-03-11 ENCOUNTER — Telehealth: Payer: Self-pay | Admitting: Pharmacist

## 2014-03-11 MED ORDER — ATORVASTATIN CALCIUM 10 MG PO TABS
ORAL_TABLET | ORAL | Status: DC
Start: 2014-03-11 — End: 2014-03-11

## 2014-03-11 MED ORDER — ATORVASTATIN CALCIUM 10 MG PO TABS: ORAL_TABLET | ORAL | Status: DC

## 2014-03-11 NOTE — Telephone Encounter (Signed)
Patient called to inform me that she had to stop pravastatin 10 mg qd due to a scary moment she had driving where she forgot where she was.  She thinks this is similar memory issue that she had when she took Crestor in the past.  She is scared to take anything that may impair her memory so she has stopped pravastatin.  She most recently failed Livalo due to itching in throat.  RF: CAD, HTN, age, low HDL - LDL goal < 70, non-HDL goal < 100  Meds: Zetia 10 mg qd, Co-Q 10 300 mg qd  Intolerant: Crestor 5-10 mg qd (memory impairment); Lipitor (muscle aches); Zocor (muscle aches), Livalo 1 mg (pruritis), Pravastatin 10 mg (possilbe memory issues)  I do not feel comfortable moving forward with PCSK-9 inhibitor study given memory issues, and the potential to worsen this with these agents.  She is okay trying other medications again that didn't cause memory problems for her, so will try to get her on twice weekly lipitor, as we have gotten 20% LDL reduction with this in past, and hopefully this along with Zetia can achieve a total of 40-45% LDL drop from baseline.  She will take lipitor 10 mg every Monday and Friday and see me and lab in 6 weeks.  She will call if issues with this medication.

## 2014-03-19 ENCOUNTER — Telehealth: Payer: Self-pay

## 2014-03-19 DIAGNOSIS — R239 Unspecified skin changes: Secondary | ICD-10-CM

## 2014-03-19 NOTE — Telephone Encounter (Signed)
Pt would like a referral to a dermatologist for a spot on her chest that she wants removed. Please advise

## 2014-03-19 NOTE — Telephone Encounter (Signed)
Order placed to dermatology as requested

## 2014-03-28 ENCOUNTER — Other Ambulatory Visit: Payer: Self-pay

## 2014-03-28 MED ORDER — ATORVASTATIN CALCIUM 10 MG PO TABS: ORAL_TABLET | ORAL | Status: DC

## 2014-04-22 ENCOUNTER — Ambulatory Visit (INDEPENDENT_AMBULATORY_CARE_PROVIDER_SITE_OTHER): Payer: Medicare Other | Admitting: *Deleted

## 2014-04-22 DIAGNOSIS — I10 Essential (primary) hypertension: Secondary | ICD-10-CM | POA: Diagnosis not present

## 2014-04-22 DIAGNOSIS — M545 Low back pain, unspecified: Secondary | ICD-10-CM | POA: Diagnosis not present

## 2014-04-22 DIAGNOSIS — Z6827 Body mass index (BMI) 27.0-27.9, adult: Secondary | ICD-10-CM | POA: Diagnosis not present

## 2014-04-22 DIAGNOSIS — E785 Hyperlipidemia, unspecified: Secondary | ICD-10-CM | POA: Diagnosis not present

## 2014-04-22 LAB — LIPID PANEL
CHOLESTEROL: 150 mg/dL (ref 0–200)
HDL: 47.8 mg/dL (ref >39.00)
LDL CALC: 82 mg/dL (ref 0–99)
NonHDL: 102.2
TRIGLYCERIDES: 100 mg/dL (ref 0.0–149.0)
Total CHOL/HDL Ratio: 3
VLDL: 20 mg/dL (ref 0.0–40.0)

## 2014-04-22 LAB — HEPATIC FUNCTION PANEL
ALBUMIN: 4.2 g/dL (ref 3.5–5.2)
ALT: 25 U/L (ref 0–35)
AST: 24 U/L (ref 0–37)
Alkaline Phosphatase: 73 U/L (ref 39–117)
BILIRUBIN DIRECT: 0.1 mg/dL (ref 0.0–0.3)
TOTAL PROTEIN: 7.7 g/dL (ref 6.0–8.3)
Total Bilirubin: 0.6 mg/dL (ref 0.2–1.2)

## 2014-04-22 NOTE — Progress Notes (Signed)
Quick Note:  Preliminary report reviewed by triage nurse and sent to MD desk. ______ 

## 2014-04-24 ENCOUNTER — Ambulatory Visit (INDEPENDENT_AMBULATORY_CARE_PROVIDER_SITE_OTHER): Payer: Medicare Other | Admitting: Pharmacist

## 2014-04-24 VITALS — Wt 150.0 lb

## 2014-04-24 DIAGNOSIS — Z79899 Other long term (current) drug therapy: Secondary | ICD-10-CM

## 2014-04-24 DIAGNOSIS — I2510 Atherosclerotic heart disease of native coronary artery without angina pectoris: Secondary | ICD-10-CM | POA: Diagnosis not present

## 2014-04-24 DIAGNOSIS — E785 Hyperlipidemia, unspecified: Secondary | ICD-10-CM | POA: Diagnosis not present

## 2014-04-24 MED ORDER — ATORVASTATIN CALCIUM 10 MG PO TABS: ORAL_TABLET | ORAL | Status: DC

## 2014-04-24 NOTE — Patient Instructions (Signed)
1.  Continue atorvastatin (Lipitor) 10 mg twice weekly. 2.  Continue Zetia 10 mg once daily. 3.  Recheck cholesterol and liver in 6 months (10/22/14 - fasting labs).  Will call you with results.  Call Ysidro Evert if problems at [phone]

## 2014-04-24 NOTE — Assessment & Plan Note (Signed)
LDL is the best it has been in 5 years, and she is finally able to tolerate statin, although only at twice weekly.  Her lipitor biw has dropped LDL from 129 mg/dL down to 82 mg/dL, and she is tolerating well.  Will not titrate medication further given her h/o failing multiple agents.  Will recheck blood work in 6 months, and will call with results.  Won't need to f/u with me unless she starts having problems with medication again.

## 2014-04-24 NOTE — Progress Notes (Signed)
Patient is a pleasant 73 y.o. WF referred to lipid clinic by Dr. Burt Knack given h/o CAD and failed multiple statins.  Since our last visit she failed Livalo due to itching in her throat, and pravastatin due to memory issues.  She has taken lipitor, Zocor, and Crestor in the past, and failed all due to side effects as well.  She is now on Lipitor 10 mg twice weekly, and tolerating this well.  She is also currently taking Zetia 10 mg qd without complaint.  LDL on this combination is down to 82 mg/dL which is the lowest her LDL has been in 5 years.  She had an MI in 2009, and recent cath in 11/2013 showed non-obstructive CAD of 2 vessels.  Her LDL was 129 mg/dL while on Zetia daily, and now 82 mg/dL on lipitor biw and Zetia qd.  Patient eats a relatively healthy diet, and does all her daily chores, but doesn't do any regular exercise.  She tells me she was told she had fibromyalgia in the past, but she hasn't been sore in years, and is curious if this was ever a real diagnosis.  Her Vitamin D in 03/2012 was 39.  She is taking Ca+D and a MVI.    RF:  CAD, HTN, age, low HDL - LDL goal < 70, non-HDL goal < 100 Meds:  Zetia 10 mg qd, Lipitor 10 mg biw, Co-Q 10 300 mg qd Intolerant:  Crestor 5-10 mg qd (memory impairment); Lipitor (muscle aches); Zocor (muscle aches), Livalo (throat itching), pravastatin (memory)  Social history:  Drinks 1 glass of wine per night.  Doesn't use tobacco products.   Diet:  Eats a relatively low fat diet already.  Breakfast is typically cereal.  Lunch is a sandwich.  Dinner is typically a vegetable, salad, and meat.  Eats out maybe twice weekly.  Eats red meat twice per week.  Eats a banana or fruit for dessert in the evenings.  Drinks hot tea daily.  Doesn't drink soda. Exercise:  Patient does all her own yard work.  She doesn't do any regular aerobic exercise currently.  She has a treadmill and is willing to start using it.  Labs:   04/2014:  TC 150, LDL 82, HDL 48, TG 100 (Lipitor 10  mg biw, Zetia 10 mg qd) 12/2013:  TC 198, LDL 129, HDL 46, TG 118 (Zetia 10 mg qd only)  Current Outpatient Prescriptions  Medication Sig Dispense Refill  . amLODipine (NORVASC) 5 MG tablet Take 1 tablet (5 mg total) by mouth daily.  90 tablet  3  . aspirin 81 MG tablet Take 81 mg by mouth daily.        Marland Kitchen atorvastatin (LIPITOR) 10 MG tablet Take 1 tablet daily twice weekly on Monday and Friday.  27 tablet  3  . Calcium Carb-Cholecalciferol 600-400 MG-UNIT TABS Take 1 tablet by mouth daily.       . Cholecalciferol (VITAMIN D) 2000 UNITS CAPS Take 1 capsule (2,000 Units total) by mouth daily.  30 capsule    . clobetasol (TEMOVATE) 0.05 % external solution Apply 1 application topically daily as needed. to scalp.  50 mL  1  . Coenzyme Q10 (CO Q-10) 300 MG CAPS Take 1 capsule by mouth daily.      . Desoximetasone (TOPICORT) 0.25 % ointment Apply 1 application topically 2 (two) times daily as needed (itching.). Apply to hands.      Marland Kitchen EPINEPHrine (EPIPEN) 0.3 mg/0.3 mL SOAJ injection Inject 0.3 mLs (0.3  mg total) into the muscle once.  1 Device  1  . ezetimibe (ZETIA) 10 MG tablet Take 1 tablet (10 mg total) by mouth daily.  90 tablet  3  . levothyroxine (SYNTHROID, LEVOTHROID) 88 MCG tablet Take 1 tablet (88 mcg total) by mouth daily before breakfast.  90 tablet  3  . loratadine (CLARITIN) 10 MG tablet Take 10 mg by mouth daily.      . Multiple Vitamins-Minerals (EYE VITAMINS) TABS Take 1 tablet by mouth daily.        . nitroGLYCERIN (NITROSTAT) 0.4 MG SL tablet Place 1 tablet (0.4 mg total) under the tongue every 5 (five) minutes as needed. For chest pain  25 tablet  11   No current facility-administered medications for this visit.   Allergies  Allergen Reactions  . Shellfish Allergy Hives and Shortness Of Breath  . Lanolin Itching  . Latex Other (See Comments)    Unknown allergy testing showed allergic  . Statins     Simvastatin, Lipitor (both caused muscle aches); Crestor 5 mg and 10 mg  daily (memory impairment), Livalo (throat and mouth itching), pravastatin 10 mg qd (memory)  . Cephalexin Itching and Rash  . Ciprofloxacin Rash  . Erythromycin Itching and Rash  . Penicillins Itching and Rash  . Propylene Glycol Itching and Rash  . Tetracycline Itching and Rash  . Tetracyclines & Related Itching and Rash   Family History  Problem Relation Age of Onset  . Colon cancer Mother 45    1st degree  . Stroke Father 32  . Stroke      Grandfather- grandmother

## 2014-06-13 DIAGNOSIS — L723 Sebaceous cyst: Secondary | ICD-10-CM | POA: Diagnosis not present

## 2014-06-13 DIAGNOSIS — L218 Other seborrheic dermatitis: Secondary | ICD-10-CM | POA: Diagnosis not present

## 2014-06-13 DIAGNOSIS — L2089 Other atopic dermatitis: Secondary | ICD-10-CM | POA: Diagnosis not present

## 2014-06-13 DIAGNOSIS — L821 Other seborrheic keratosis: Secondary | ICD-10-CM | POA: Diagnosis not present

## 2014-07-24 ENCOUNTER — Encounter: Payer: Self-pay | Admitting: Cardiovascular Disease

## 2014-07-24 ENCOUNTER — Ambulatory Visit (INDEPENDENT_AMBULATORY_CARE_PROVIDER_SITE_OTHER): Payer: Medicare Other | Admitting: Cardiovascular Disease

## 2014-07-24 VITALS — BP 124/78 | HR 87 | Ht 62.0 in | Wt 149.0 lb

## 2014-07-24 DIAGNOSIS — I10 Essential (primary) hypertension: Secondary | ICD-10-CM | POA: Diagnosis not present

## 2014-07-24 DIAGNOSIS — E785 Hyperlipidemia, unspecified: Secondary | ICD-10-CM | POA: Diagnosis not present

## 2014-07-24 DIAGNOSIS — I2510 Atherosclerotic heart disease of native coronary artery without angina pectoris: Secondary | ICD-10-CM

## 2014-07-24 NOTE — Progress Notes (Signed)
HPI:   73 year-old woman returning for further discussion of coronary artery disease, chronic angina, hypertension, and hyperlipidemia. The patient had a non-ST MI in 2009 and was noted to have small vessel disease. She did have balloon angioplasty of a small circumflex branch. Her last functional study in 2012 showed no ischemia. She underwent cardiac catheterization this year for progressive anginal symptoms. This demonstrated moderate diffuse mid LAD stenosis. FFR analysis was borderline. She was treated medically. She has been unable to tolerate long-acting nitrates or metoprolol.  She also has a hx of statin intolerance and she is currently able to tolerate atorvastatin one day per week. Her most recent lipids were drawn on atorvastatin twice weekly, but she developed increasing myalgias with this. Most recent lipids: Lipid Panel     Component Value Date/Time   CHOL 150 04/22/2014 0832   TRIG 100.0 04/22/2014 0832   HDL 47.80 04/22/2014 0832   CHOLHDL 3 04/22/2014 0832   VLDL 20.0 04/22/2014 0832   LDLCALC 82 04/22/2014 0832   She reports a few recent episodes of chest pain at rest. She describes aching pain in the left axillary and upper chest region. Pain occurred at rest without specific provocation. The pain resolved with 2 aspirin. She has not required nitroglycerin. She denies shortness of breath, edema, or palpitations, or other cardiac symptoms.   Outpatient Encounter Prescriptions as of 07/24/2014  Medication Sig  . amLODipine (NORVASC) 5 MG tablet Take 1 tablet (5 mg total) by mouth daily.  Marland Kitchen aspirin 81 MG tablet Take 81 mg by mouth daily.    Marland Kitchen atorvastatin (LIPITOR) 10 MG tablet Take 10 mg by mouth. Take 1 tablet daily twice weekly on Monday and Friday.  . Calcium Carb-Cholecalciferol 600-400 MG-UNIT TABS Take 1 tablet by mouth daily.   . Cholecalciferol (VITAMIN D) 2000 UNITS CAPS Take 1 capsule (2,000 Units total) by mouth daily.  . clobetasol (TEMOVATE) 0.05 % external  solution Apply 1 application topically daily as needed. to scalp.  . Coenzyme Q10 (CO Q-10) 300 MG CAPS Take 2 mLs by mouth daily.   . Desoximetasone (TOPICORT) 0.25 % ointment Apply 1 application topically 2 (two) times daily as needed (itching.). Apply to hands.  Marland Kitchen EPINEPHrine (EPIPEN) 0.3 mg/0.3 mL SOAJ injection Inject 0.3 mLs (0.3 mg total) into the muscle once.  . ezetimibe (ZETIA) 10 MG tablet Take 1 tablet (10 mg total) by mouth daily.  Marland Kitchen levothyroxine (SYNTHROID, LEVOTHROID) 88 MCG tablet Take 1 tablet (88 mcg total) by mouth daily before breakfast.  . loratadine (CLARITIN) 10 MG tablet Take 10 mg by mouth daily.  . Multiple Vitamins-Minerals (EYE VITAMINS) TABS Take 1 tablet by mouth daily.    . nitroGLYCERIN (NITROSTAT) 0.4 MG SL tablet Place 1 tablet (0.4 mg total) under the tongue every 5 (five) minutes as needed. For chest pain  . [DISCONTINUED] atorvastatin (LIPITOR) 10 MG tablet Take 1 tablet daily twice weekly on Monday and Friday.    Allergies  Allergen Reactions  . Shellfish Allergy Hives and Shortness Of Breath  . Codeine   . Lanolin Itching  . Latex Other (See Comments)    Unknown allergy testing showed allergic  . Statins     Simvastatin, Lipitor (both caused muscle aches); Crestor 5 mg and 10 mg daily (memory impairment), Livalo (throat and mouth itching), pravastatin 10 mg qd (memory)  . Cephalexin Itching and Rash  . Ciprofloxacin Rash  . Erythromycin Itching and Rash  . Penicillins Itching and Rash  .  Propylene Glycol Itching and Rash  . Tetracycline Itching and Rash  . Tetracyclines & Related Itching and Rash    Past Medical History  Diagnosis Date  . CAD (coronary artery disease)     unspecified site with NSTEMI 2009. Small OM treated with PCA  . Atopic eczema   . Osteoporosis     hx tailbone fx; bisphos >75yr tx, stopped 2011  . Hyperlipidemia     intol statin  . Hypothyroidism   . Essential hypertension, benign     white coat     dr Madisynn Plair  .  Myocardial infarction     2009  . Acute lumbar radiculopathy 04/2012    L L5-S1 s/p decompression surg    ROS: Negative except as per HPI  BP 124/78  Pulse 87  Ht 5\' 2"  (1.575 m)  Wt 149 lb (67.586 kg)  BMI 27.25 kg/m2  PHYSICAL EXAM: Pt is alert and oriented, NAD HEENT: normal Neck: JVP - normal, carotids 2+= without bruits Lungs: CTA bilaterally CV: RRR without murmur or gallop Abd: soft, NT, Positive BS, no hepatomegaly Ext: no C/C/E, distal pulses intact and equal Skin: warm/dry no rash  EKG:  Normal sinus rhythm with left axis deviation, heart rate 87 beats per minute, possible age indeterminate inferior infarct.  ASSESSMENT AND PLAN: 1. Coronary artery disease, native vessel. Overall she appears stable. I am not certain whether her occasional chest pains are cardiac related since they are nonexertional. They seem to respond quickly to aspirin and there is no crescendo pattern present. Would recommend ongoing medical therapy.   2. Essential hypertension. Blood pressure well controlled with amlodipine.   3. Hyperlipidemia. Atorvastatin dose limited by myalgias. Repeat lipids and LFTs in January.  I will see her back in followup in 6 months unless problems arise. She was instructed to take nitroglycerin if refractory chest pain and to call EMS if chest pain is unresponsive to nitroglycerin.  [name] 07/24/2014 9:05 AM

## 2014-07-24 NOTE — Patient Instructions (Signed)
Your physician recommends that you return for a FASTING LIPID and LIVER in January 2016--nothing to eat or drink after midnight, lab opens at 7:30 AM  Your physician wants you to follow-up in: 6 MONTHS with Dr Burt Knack.  You will receive a reminder letter in the mail two months in advance. If you don't receive a letter, please call our office to schedule the follow-up appointment.  Your physician recommends that you continue on your current medications as directed. Please refer to the Current Medication list given to you today.

## 2014-09-11 ENCOUNTER — Encounter (HOSPITAL_COMMUNITY): Payer: Self-pay | Admitting: Cardiovascular Disease

## 2014-09-30 ENCOUNTER — Ambulatory Visit (INDEPENDENT_AMBULATORY_CARE_PROVIDER_SITE_OTHER): Payer: Medicare Other | Admitting: *Deleted

## 2014-09-30 ENCOUNTER — Ambulatory Visit: Payer: Medicare Other

## 2014-09-30 DIAGNOSIS — Z23 Encounter for immunization: Secondary | ICD-10-CM | POA: Diagnosis not present

## 2014-10-06 ENCOUNTER — Ambulatory Visit (INDEPENDENT_AMBULATORY_CARE_PROVIDER_SITE_OTHER): Payer: Medicare Other | Admitting: Internal Medicine

## 2014-10-06 ENCOUNTER — Encounter: Payer: Self-pay | Admitting: Internal Medicine

## 2014-10-06 VITALS — BP 140/80 | HR 84 | Temp 97.6°F | Resp 16 | Ht 63.0 in | Wt 148.2 lb

## 2014-10-06 DIAGNOSIS — M7061 Trochanteric bursitis, right hip: Secondary | ICD-10-CM | POA: Diagnosis not present

## 2014-10-06 NOTE — Patient Instructions (Signed)
We will check your thyroid today to make sure the levels are appropriate. We will have you try the stretches below for about 2-3 weeks. We also recommend icing the hip for about 20 minutes before bedtime (or when you are having pain) for pain relief. If you ar not getting relief call us back and we can send you to the sports medicine doctor for evaluation.   We will call you back with your lab results.   Trochanteric Bursitis You have hip pain due to trochanteric bursitis. Bursitis means that the sack near the outside of the hip is filled with fluid and inflamed. This sack is made up of protective soft tissue. The pain from trochanteric bursitis can be severe and keep you from sleep. It can radiate to the buttocks or down the outside of the thigh to the knee. The pain is almost always worse when rising from the seated or lying position and with walking. Pain can improve after you take a few steps. It happens more often in people with hip joint and lumbar spine problems, such as arthritis or previous surgery. Very rarely the trochanteric bursa can become infected, and antibiotics and/or surgery may be needed. Treatment often includes an injection of local anesthetic mixed with cortisone medicine. This medicine is injected into the area where it is most tender over the hip. Repeat injections may be necessary if the response to treatment is slow. You can apply ice packs over the tender area for 30 minutes every 2 hours for the next few days. Anti-inflammatory and/or narcotic pain medicine may also be helpful. Limit your activity for the next few days if the pain continues. See your caregiver in 5-10 days if you are not greatly improved.  SEEK IMMEDIATE MEDICAL CARE IF:  You develop severe pain, fever, or increased redness.  You have pain that radiates below the knee. EXERCISES (For all stretches repeat 3 times, 5 repititions and/or hold about 30 seconds) STRETCHING EXERCISES - Trochanteric Bursitis  These  exercises may help you when beginning to rehabilitate your injury. Your symptoms may resolve with or without further involvement from your physician, physical therapist, or athletic trainer. While completing these exercises, remember:   Restoring tissue flexibility helps normal motion to return to the joints. This allows healthier, less painful movement and activity.  An effective stretch should be held for at least 30 seconds.  A stretch should never be painful. You should only feel a gentle lengthening or release in the stretched tissue. STRETCH - Iliotibial Band  On the floor or bed, lie on your side so your injured leg is on top. Bend your knee and grab your ankle.  Slowly bring your knee back so that your thigh is in line with your trunk. Keep your heel at your buttocks and gently arch your back so your head, shoulders and hips line up.  Slowly lower your leg so that your knee approaches the floor/bed until you feel a gentle stretch on the outside of your thigh. If you do not feel a stretch and your knee will not fall farther, place the heel of your opposite foot on top of your knee and pull your thigh down farther.  Hold this stretch for __________ seconds.  Repeat __________ times. Complete this exercise __________ times per day. STRETCH - Hamstrings, Supine   Lie on your back. Loop a belt or towel over the ball of your foot as shown.  Straighten your knee and slowly pull on the belt to raise  your injured leg. Do not allow the knee to bend. Keep your opposite leg flat on the floor.  Raise the leg until you feel a gentle stretch behind your knee or thigh. Hold this position for __________ seconds.  Repeat __________ times. Complete this stretch __________ times per day. STRETCH - Quadriceps, Prone   Lie on your stomach on a firm surface, such as a bed or padded floor.  Bend your knee and grasp your ankle. If you are unable to reach your ankle or pant leg, use a belt around your  foot to lengthen your reach.  Gently pull your heel toward your buttocks. Your knee should not slide out to the side. You should feel a stretch in the front of your thigh and/or knee.  Hold this position for __________ seconds.  Repeat __________ times. Complete this stretch __________ times per day. STRETCHING - Hip Flexors, Lunge Half kneel with your knee on the floor and your opposite knee bent and directly over your ankle.  Keep good posture with your head over your shoulders. Tighten your buttocks to point your tailbone downward; this will prevent your back from arching too much.  You should feel a gentle stretch in the front of your thigh and/or hip. If you do not feel any resistance, slightly slide your opposite foot forward and then slowly lunge forward so your knee once again lines up over your ankle. Be sure your tailbone remains pointed downward.  Hold this stretch for __________ seconds.  Repeat __________ times. Complete this stretch __________ times per day. STRETCH - Adductors, Lunge  While standing, spread your legs.  Lean away from your injured leg by bending your opposite knee. You may rest your hands on your thigh for balance.  You should feel a stretch in your inner thigh. Hold for __________ seconds.  Repeat __________ times. Complete this exercise __________ times per day. Document Released: 10/27/2004 Document Revised: 02/03/2014 Document Reviewed: 01/01/2009 [facility] Patient Information 2015 [place], [place]. This information is not intended to replace advice given to you by your health care provider. Make sure you discuss any questions you have with your health care provider.

## 2014-10-06 NOTE — Progress Notes (Signed)
Pre visit review using our clinic review tool, if applicable. No additional management support is needed unless otherwise documented below in the visit note. 

## 2014-10-06 NOTE — Assessment & Plan Note (Signed)
Advised NSAIDS, icing, exercise regimen. If no better in 2 weeks will refer to sports medicine. She was concerned about thigh lymphoma (some distant relative/acquiantence had) and reassured her that this would be palpable, she does not have LAD, no fevers, weight loss.

## 2014-10-06 NOTE — Progress Notes (Signed)
   Subjective:    Patient ID: Victoria [name], female    DOB: [birth year], 74 y.o.   MRN: [id number]  HPI The patient is a 74 YO female who is coming in for hip and thigh pain since the summer. Some days are better than others and she sometimes has to take aspirin for the pain. This is effective but she wants to know what is causing the pain. She denies fevers, chills, weight loss, rash. She saw her neurosurgeon about it and he took x-rays and said it was not coming from her back. She denies new problems although has some uncertainties about her doctor only working part time.   Review of Systems  Constitutional: Negative for fever, activity change, appetite change, fatigue and unexpected weight change.  Respiratory: Negative for cough, chest tightness, shortness of breath and wheezing.   Cardiovascular: Negative for chest pain, palpitations and leg swelling.  Musculoskeletal: Positive for myalgias and arthralgias. Negative for back pain and gait problem.  Skin: Negative.   Neurological: Negative.       Objective:   Physical Exam  Constitutional: She is oriented to person, place, and time. She appears well-developed and well-nourished.  HENT:  Head: Normocephalic and atraumatic.  Eyes: EOM are normal.  Neck: Normal range of motion.  Cardiovascular: Normal rate and regular rhythm.   Pulmonary/Chest: Effort normal and breath sounds normal.  Abdominal: Soft.  Musculoskeletal: She exhibits tenderness.  Right trochanteric bursitis  Neurological: She is alert and oriented to person, place, and time. No cranial nerve deficit. Coordination normal.  Skin: Skin is warm and dry. No rash noted. No erythema.   Filed Vitals:   10/06/14 1426  BP: 140/80  Pulse: 84  Temp: 97.6 F (36.4 C)  TempSrc: Oral  Resp: 16  Height: 5\' 3"  (1.6 m)  Weight: 148 lb 3.2 oz (67.223 kg)  SpO2: 97%      Assessment & Plan:

## 2014-10-22 ENCOUNTER — Other Ambulatory Visit (INDEPENDENT_AMBULATORY_CARE_PROVIDER_SITE_OTHER): Payer: Medicare Other | Admitting: *Deleted

## 2014-10-22 DIAGNOSIS — E785 Hyperlipidemia, unspecified: Secondary | ICD-10-CM

## 2014-10-22 DIAGNOSIS — Z79899 Other long term (current) drug therapy: Secondary | ICD-10-CM

## 2014-10-22 LAB — LIPID PANEL
Cholesterol: 144 mg/dL (ref 0–200)
HDL: 41.5 mg/dL (ref >39.00)
LDL CALC: 83 mg/dL (ref 0–99)
NonHDL: 102.5
Total CHOL/HDL Ratio: 3
Triglycerides: 100 mg/dL (ref 0.0–149.0)
VLDL: 20 mg/dL (ref 0.0–40.0)

## 2014-10-22 LAB — HEPATIC FUNCTION PANEL
ALT: 16 U/L (ref 0–35)
AST: 18 U/L (ref 0–37)
Albumin: 4 g/dL (ref 3.5–5.2)
Alkaline Phosphatase: 86 U/L (ref 39–117)
BILIRUBIN TOTAL: 0.6 mg/dL (ref 0.2–1.2)
Bilirubin, Direct: 0.1 mg/dL (ref 0.0–0.3)
TOTAL PROTEIN: 7.1 g/dL (ref 6.0–8.3)

## 2014-12-22 ENCOUNTER — Other Ambulatory Visit: Payer: Self-pay | Admitting: Cardiovascular Disease

## 2014-12-22 ENCOUNTER — Other Ambulatory Visit: Payer: Self-pay | Admitting: Internal Medicine

## 2014-12-31 ENCOUNTER — Encounter (HOSPITAL_COMMUNITY): Payer: Self-pay | Admitting: Cardiovascular Disease

## 2014-12-31 DIAGNOSIS — I10 Essential (primary) hypertension: Secondary | ICD-10-CM

## 2014-12-31 MED ORDER — AMLODIPINE BESYLATE 5 MG PO TABS: 5.0000 mg | ORAL_TABLET | Freq: Every day | ORAL | Status: DC

## 2015-01-09 ENCOUNTER — Other Ambulatory Visit (INDEPENDENT_AMBULATORY_CARE_PROVIDER_SITE_OTHER): Payer: Medicare Other

## 2015-01-09 ENCOUNTER — Encounter: Payer: Self-pay | Admitting: Internal Medicine

## 2015-01-09 ENCOUNTER — Ambulatory Visit (INDEPENDENT_AMBULATORY_CARE_PROVIDER_SITE_OTHER): Payer: Medicare Other | Admitting: Internal Medicine

## 2015-01-09 VITALS — BP 144/82 | HR 90 | Temp 98.2°F | Resp 16 | Ht 64.0 in | Wt 145.1 lb

## 2015-01-09 DIAGNOSIS — Z0000 Encounter for general adult medical examination without abnormal findings: Secondary | ICD-10-CM

## 2015-01-09 DIAGNOSIS — E039 Hypothyroidism, unspecified: Secondary | ICD-10-CM

## 2015-01-09 DIAGNOSIS — Z23 Encounter for immunization: Secondary | ICD-10-CM

## 2015-01-09 DIAGNOSIS — D509 Iron deficiency anemia, unspecified: Secondary | ICD-10-CM

## 2015-01-09 DIAGNOSIS — M810 Age-related osteoporosis without current pathological fracture: Secondary | ICD-10-CM

## 2015-01-09 DIAGNOSIS — M7061 Trochanteric bursitis, right hip: Secondary | ICD-10-CM

## 2015-01-09 DIAGNOSIS — E785 Hyperlipidemia, unspecified: Secondary | ICD-10-CM

## 2015-01-09 LAB — TSH: TSH: 0.17 u[IU]/mL — ABNORMAL LOW (ref 0.35–4.50)

## 2015-01-09 LAB — BASIC METABOLIC PANEL
BUN: 16 mg/dL (ref 6–23)
CO2: 28 mEq/L (ref 19–32)
CREATININE: 0.79 mg/dL (ref 0.40–1.20)
Calcium: 10.6 mg/dL — ABNORMAL HIGH (ref 8.4–10.5)
Chloride: 104 mEq/L (ref 96–112)
GFR: 75.66 mL/min (ref >60.00)
Glucose, Bld: 104 mg/dL — ABNORMAL HIGH (ref 70–99)
Potassium: 4 mEq/L (ref 3.5–5.1)
Sodium: 138 mEq/L (ref 135–145)

## 2015-01-09 LAB — CBC
HCT: 39.5 % (ref 36.0–46.0)
Hemoglobin: 13.8 g/dL (ref 12.0–15.0)
MCHC: 35.1 g/dL (ref 30.0–36.0)
MCV: 89.8 fl (ref 78.0–100.0)
PLATELETS: 267 10*3/uL (ref 150.0–400.0)
RBC: 4.4 Mil/uL (ref 3.87–5.11)
RDW: 13 % (ref 11.5–15.5)
WBC: 8.2 10*3/uL (ref 4.0–10.5)

## 2015-01-09 LAB — FERRITIN: Ferritin: 59.9 ng/mL (ref 10.0–291.0)

## 2015-01-09 NOTE — Assessment & Plan Note (Signed)
No overt symptoms of over or under replacement. Check TSH and if change synthroid dosing as appropriate. Currently taking 88 mcg daily and takes faithfully.

## 2015-01-09 NOTE — Assessment & Plan Note (Signed)
Has tried and failed conservative management and would like to see the sports medicine doctor. Will have her schedule. Overall she is doing mildly better.

## 2015-01-09 NOTE — Progress Notes (Signed)
Pre visit review using our clinic review tool, if applicable. No additional management support is needed unless otherwise documented below in the visit note. 

## 2015-01-09 NOTE — Assessment & Plan Note (Signed)
Last DEXA stable and she does not wish to go back on medicine for her bones. Still advised to take calcium and vitamin D and to do weight bearing activity at least 3 times weekly (She is doing daily).

## 2015-01-09 NOTE — Progress Notes (Signed)
   Subjective:    Patient ID: Victoria [name], female    DOB: 19-Aug-1941, 74 y.o.   MRN: [id number]  HPI Here for medicare wellness, no new complaints today. Wants to ask about her bone density and get her thyroid checked. Her hip is still hurting.  Diet: heart healthy Physical activity: exercises daily, weight bearing Depression/mood screen: negative Hearing: intact to whispered voice Visual acuity: grossly normal, performs annual eye exam  ADLs: capable Fall risk: none Home safety: good Cognitive evaluation: intact to orientation, naming, recall and repetition EOL planning: adv directives discussed and in place  I have personally reviewed and have noted 1. The patient's medical and social history - reviewed no updates today 2. Their use of alcohol, tobacco or illicit drugs 3. Their current medications and supplements 4. The patient's functional ability including ADL's, fall risks, home safety risks and hearing or visual impairment. 5. Diet and physical activities 6. Evidence for depression or mood disorders 7. Care team reviewed and updated (available in snapshot)  Review of Systems  Constitutional: Negative for fever, activity change, appetite change, fatigue and unexpected weight change.  HENT: Negative.   Respiratory: Negative for cough, chest tightness, shortness of breath and wheezing.   Cardiovascular: Negative for chest pain, palpitations and leg swelling.  Gastrointestinal: Negative for abdominal pain, diarrhea, constipation and abdominal distention.  Musculoskeletal: Positive for myalgias and arthralgias. Negative for back pain and gait problem.  Skin: Negative.   Neurological: Negative.   Psychiatric/Behavioral: Negative.       Objective:   Physical Exam  Constitutional: She is oriented to person, place, and time. She appears well-developed and well-nourished.  HENT:  Head: Normocephalic and atraumatic.  Eyes: EOM are normal.  Neck: Normal range of motion.    Cardiovascular: Normal rate and regular rhythm.   Pulmonary/Chest: Effort normal and breath sounds normal.  Abdominal: Soft. She exhibits no distension. There is no tenderness.  Musculoskeletal:  Still with tenderness on the lateral right hip, better than before  Neurological: She is alert and oriented to person, place, and time. No cranial nerve deficit. Coordination normal.  Skin: Skin is warm and dry. No rash noted. No erythema.   Filed Vitals:   01/09/15 0942  BP: 144/82  Pulse: 90  Temp: 98.2 F (36.8 C)  TempSrc: Oral  Resp: 16  Height: 5\' 4"  (1.626 m)  Weight: 145 lb 1.9 oz (65.826 kg)  SpO2: 97%      Assessment & Plan:  Prevnar 13 given today.

## 2015-01-09 NOTE — Patient Instructions (Addendum)
We will have you see Dr. Tamala Julian to check on the right hip area.   We have given you the pneumonia booster shot today and will check on the blood work.   You should stop getting the calls and if not please call and let me know. Talk to the front desk to get things changed in the computer.   We will call you back about the labs and see you back in about 6 months.  Keep up the good work with exercising as this helps to keep the bones healthy and keep taking the vitamin D.   Health Maintenance Adopting a healthy lifestyle and getting preventive care can go a long way to promote health and wellness. Talk with your health care provider about what schedule of regular examinations is right for you. This is a good chance for you to check in with your provider about disease prevention and staying healthy. In between checkups, there are plenty of things you can do on your own. Experts have done a lot of research about which lifestyle changes and preventive measures are most likely to keep you healthy. Ask your health care provider for more information. WEIGHT AND DIET  Eat a healthy diet  Be sure to include plenty of vegetables, fruits, low-fat dairy products, and lean protein.  Do not eat a lot of foods high in solid fats, added sugars, or salt.  Get regular exercise. This is one of the most important things you can do for your health.  Most adults should exercise for at least 150 minutes each week. The exercise should increase your heart rate and make you sweat (moderate-intensity exercise).  Most adults should also do strengthening exercises at least twice a week. This is in addition to the moderate-intensity exercise.  Maintain a healthy weight  Body mass index (BMI) is a measurement that can be used to identify possible weight problems. It estimates body fat based on height and weight. Your health care provider can help determine your BMI and help you achieve or maintain a healthy weight.  For  females 56 years of age and older:   A BMI below 18.5 is considered underweight.  A BMI of 18.5 to 24.9 is normal.  A BMI of 25 to 29.9 is considered overweight.  A BMI of 30 and above is considered obese.  Watch levels of cholesterol and blood lipids  You should start having your blood tested for lipids and cholesterol at 74 years of age, then have this test every 5 years.  You may need to have your cholesterol levels checked more often if:  Your lipid or cholesterol levels are high.  You are older than 74 years of age.  You are at high risk for heart disease.  CANCER SCREENING   Lung Cancer  Lung cancer screening is recommended for adults 20-2 years old who are at high risk for lung cancer because of a history of smoking.  A yearly low-dose CT scan of the lungs is recommended for people who:  Currently smoke.  Have quit within the past 15 years.  Have at least a 30-pack-year history of smoking. A pack year is smoking an average of one pack of cigarettes a day for 1 year.  Yearly screening should continue until it has been 15 years since you quit.  Yearly screening should stop if you develop a health problem that would prevent you from having lung cancer treatment.  Breast Cancer  Practice breast self-awareness. This means understanding how  your breasts normally appear and feel.  It also means doing regular breast self-exams. Let your health care provider know about any changes, no matter how small.  If you are in your 20s or 30s, you should have a clinical breast exam (CBE) by a health care provider every 1-3 years as part of a regular health exam.  If you are 49 or older, have a CBE every year. Also consider having a breast X-ray (mammogram) every year.  If you have a family history of breast cancer, talk to your health care provider about genetic screening.  If you are at high risk for breast cancer, talk to your health care provider about having an MRI and  a mammogram every year.  Breast cancer gene (BRCA) assessment is recommended for women who have family members with BRCA-related cancers. BRCA-related cancers include:  Breast.  Ovarian.  Tubal.  Peritoneal cancers.  Results of the assessment will determine the need for genetic counseling and BRCA1 and BRCA2 testing. Cervical Cancer Routine pelvic examinations to screen for cervical cancer are no longer recommended for nonpregnant women who are considered low risk for cancer of the pelvic organs (ovaries, uterus, and vagina) and who do not have symptoms. A pelvic examination may be necessary if you have symptoms including those associated with pelvic infections. Ask your health care provider if a screening pelvic exam is right for you.   The Pap test is the screening test for cervical cancer for women who are considered at risk.  If you had a hysterectomy for a problem that was not cancer or a condition that could lead to cancer, then you no longer need Pap tests.  If you are older than 65 years, and you have had normal Pap tests for the past 10 years, you no longer need to have Pap tests.  If you have had past treatment for cervical cancer or a condition that could lead to cancer, you need Pap tests and screening for cancer for at least 20 years after your treatment.  If you no longer get a Pap test, assess your risk factors if they change (such as having a new sexual partner). This can affect whether you should start being screened again.  Some women have medical problems that increase their chance of getting cervical cancer. If this is the case for you, your health care provider may recommend more frequent screening and Pap tests.  The human papillomavirus (HPV) test is another test that may be used for cervical cancer screening. The HPV test looks for the virus that can cause cell changes in the cervix. The cells collected during the Pap test can be tested for HPV.  The HPV test can  be used to screen women 96 years of age and older. Getting tested for HPV can extend the interval between normal Pap tests from three to five years.  An HPV test also should be used to screen women of any age who have unclear Pap test results.  After 74 years of age, women should have HPV testing as often as Pap tests.  Colorectal Cancer  This type of cancer can be detected and often prevented.  Routine colorectal cancer screening usually begins at 74 years of age and continues through 74 years of age.  Your health care provider may recommend screening at an earlier age if you have risk factors for colon cancer.  Your health care provider may also recommend using home test kits to check for hidden blood in the  stool.  A small camera at the end of a tube can be used to examine your colon directly (sigmoidoscopy or colonoscopy). This is done to check for the earliest forms of colorectal cancer.  Routine screening usually begins at age 35.  Direct examination of the colon should be repeated every 5-10 years through 74 years of age. However, you may need to be screened more often if early forms of precancerous polyps or small growths are found. Skin Cancer  Check your skin from head to toe regularly.  Tell your health care provider about any new moles or changes in moles, especially if there is a change in a mole's shape or color.  Also tell your health care provider if you have a mole that is larger than the size of a pencil eraser.  Always use sunscreen. Apply sunscreen liberally and repeatedly throughout the day.  Protect yourself by wearing long sleeves, pants, a wide-brimmed hat, and sunglasses whenever you are outside. HEART DISEASE, DIABETES, AND HIGH BLOOD PRESSURE   Have your blood pressure checked at least every 1-2 years. High blood pressure causes heart disease and increases the risk of stroke.  If you are between 76 years and 45 years old, ask your health care provider if  you should take aspirin to prevent strokes.  Have regular diabetes screenings. This involves taking a blood sample to check your fasting blood sugar level.  If you are at a normal weight and have a low risk for diabetes, have this test once every three years after 74 years of age.  If you are overweight and have a high risk for diabetes, consider being tested at a younger age or more often. PREVENTING INFECTION  Hepatitis B  If you have a higher risk for hepatitis B, you should be screened for this virus. You are considered at high risk for hepatitis B if:  You were born in a country where hepatitis B is common. Ask your health care provider which countries are considered high risk.  Your parents were born in a high-risk country, and you have not been immunized against hepatitis B (hepatitis B vaccine).  You have HIV or AIDS.  You use needles to inject street drugs.  You live with someone who has hepatitis B.  You have had sex with someone who has hepatitis B.  You get hemodialysis treatment.  You take certain medicines for conditions, including cancer, organ transplantation, and autoimmune conditions. Hepatitis C  Blood testing is recommended for:  Everyone born from [birth year] through 1965.  Anyone with known risk factors for hepatitis C. Sexually transmitted infections (STIs)  You should be screened for sexually transmitted infections (STIs) including gonorrhea and chlamydia if:  You are sexually active and are younger than 74 years of age.  You are older than 74 years of age and your health care provider tells you that you are at risk for this type of infection.  Your sexual activity has changed since you were last screened and you are at an increased risk for chlamydia or gonorrhea. Ask your health care provider if you are at risk.  If you do not have HIV, but are at risk, it may be recommended that you take a prescription medicine daily to prevent HIV infection. This is  called pre-exposure prophylaxis (PrEP). You are considered at risk if:  You are sexually active and do not regularly use condoms or know the HIV status of your partner(s).  You take drugs by injection.  You are  sexually active with a partner who has HIV. Talk with your health care provider about whether you are at high risk of being infected with HIV. If you choose to begin PrEP, you should first be tested for HIV. You should then be tested every 3 months for as long as you are taking PrEP.  PREGNANCY   If you are premenopausal and you may become pregnant, ask your health care provider about preconception counseling.  If you may become pregnant, take 400 to 800 micrograms (mcg) of folic acid every day.  If you want to prevent pregnancy, talk to your health care provider about birth control (contraception). OSTEOPOROSIS AND MENOPAUSE   Osteoporosis is a disease in which the bones lose minerals and strength with aging. This can result in serious bone fractures. Your risk for osteoporosis can be identified using a bone density scan.  If you are 79 years of age or older, or if you are at risk for osteoporosis and fractures, ask your health care provider if you should be screened.  Ask your health care provider whether you should take a calcium or vitamin D supplement to lower your risk for osteoporosis.  Menopause may have certain physical symptoms and risks.  Hormone replacement therapy may reduce some of these symptoms and risks. Talk to your health care provider about whether hormone replacement therapy is right for you.  HOME CARE INSTRUCTIONS   Schedule regular health, dental, and eye exams.  Stay current with your immunizations.   Do not use any tobacco products including cigarettes, chewing tobacco, or electronic cigarettes.  If you are pregnant, do not drink alcohol.  If you are breastfeeding, limit how much and how often you drink alcohol.  Limit alcohol intake to no more  than 1 drink per day for nonpregnant women. One drink equals 12 ounces of beer, 5 ounces of wine, or 1 ounces of hard liquor.  Do not use street drugs.  Do not share needles.  Ask your health care provider for help if you need support or information about quitting drugs.  Tell your health care provider if you often feel depressed.  Tell your health care provider if you have ever been abused or do not feel safe at home. Document Released: 04/04/2011 Document Revised: 02/03/2014 Document Reviewed: 08/21/2013 [facility] Patient Information 2015 [place], [place]. This information is not intended to replace advice given to you by your health care provider. Make sure you discuss any questions you have with your health care provider.

## 2015-01-09 NOTE — Assessment & Plan Note (Signed)
Declines further mammogram. Will defer today. Given prevnar today to complete pneumonia series, shingles done. Flu shot given this season. She is up to date on tetanus. 10 year screening recommendations provided to her today at visit. Has living will in place. Talked with her about sun safety and protection with clothing or sunscreen. She is aware. Non-smoker and reminded her to not start smoking.

## 2015-01-09 NOTE — Assessment & Plan Note (Signed)
Reviewed last lipid panel which is at goal despite only taking a couple times a week. Continue lipitor as tolerated.

## 2015-01-12 ENCOUNTER — Other Ambulatory Visit: Payer: Self-pay | Admitting: Geriatric Medicine

## 2015-01-12 DIAGNOSIS — E039 Hypothyroidism, unspecified: Secondary | ICD-10-CM

## 2015-01-15 ENCOUNTER — Encounter: Payer: Self-pay | Admitting: Internal Medicine

## 2015-01-15 NOTE — Telephone Encounter (Signed)
Pt called and said that her thyroid and dropped and she feels like the meds needs to be adjusted ?  She is wanting a call back from nurse about these labs    Best number [phone]

## 2015-01-15 NOTE — Telephone Encounter (Signed)
Amy, can you check and see if the free T4 level we added on was ever done, if not she needs to come back to get drawn?

## 2015-01-23 ENCOUNTER — Ambulatory Visit: Payer: Medicare Other | Admitting: Family Medicine

## 2015-01-27 ENCOUNTER — Encounter: Payer: Self-pay | Admitting: Internal Medicine

## 2015-01-28 ENCOUNTER — Other Ambulatory Visit (INDEPENDENT_AMBULATORY_CARE_PROVIDER_SITE_OTHER): Payer: Medicare Other

## 2015-01-28 ENCOUNTER — Encounter: Payer: Medicare Other | Admitting: Internal Medicine

## 2015-01-28 DIAGNOSIS — E039 Hypothyroidism, unspecified: Secondary | ICD-10-CM | POA: Diagnosis not present

## 2015-01-28 LAB — T4, FREE: Free T4: 1.17 ng/dL (ref 0.60–1.60)

## 2015-03-02 ENCOUNTER — Other Ambulatory Visit: Payer: Self-pay | Admitting: Cardiovascular Disease

## 2015-03-09 ENCOUNTER — Other Ambulatory Visit: Payer: Self-pay | Admitting: Geriatric Medicine

## 2015-03-09 ENCOUNTER — Telehealth: Payer: Self-pay | Admitting: Internal Medicine

## 2015-03-09 ENCOUNTER — Other Ambulatory Visit: Payer: Self-pay | Admitting: Internal Medicine

## 2015-03-09 MED ORDER — LEVOTHYROXINE SODIUM 88 MCG PO TABS: 88.0000 ug | ORAL_TABLET | Freq: Every day | ORAL | Status: DC

## 2015-03-09 NOTE — Telephone Encounter (Signed)
Patient is requesting refill for SYNTHROID 88 MCG tablet [[id number]. Pharmacy is [facility]

## 2015-03-31 ENCOUNTER — Telehealth: Payer: Self-pay | Admitting: Cardiovascular Disease

## 2015-03-31 NOTE — Telephone Encounter (Signed)
Left message on machine for pt to contact the office.   

## 2015-03-31 NOTE — Telephone Encounter (Signed)
I spoke with the pt and made her aware that I will contact her when an appointment becomes available with Dr Burt Knack.  I made the pt aware that she is due for a carotid duplex but the pt does not feel like this is necessary at this time.

## 2015-03-31 NOTE — Telephone Encounter (Signed)
Follow Up ° ° ° ° ° ° ° °Pt returning Lauren's phone call. °

## 2015-03-31 NOTE — Telephone Encounter (Signed)
New Message    Pt wants to speak to Rn about making room on Dr. Doran Heater

## 2015-04-08 ENCOUNTER — Ambulatory Visit (INDEPENDENT_AMBULATORY_CARE_PROVIDER_SITE_OTHER): Payer: Medicare Other | Admitting: Cardiovascular Disease

## 2015-04-08 ENCOUNTER — Encounter: Payer: Self-pay | Admitting: Cardiovascular Disease

## 2015-04-08 VITALS — BP 136/84 | HR 69 | Ht 64.0 in | Wt 147.0 lb

## 2015-04-08 DIAGNOSIS — E785 Hyperlipidemia, unspecified: Secondary | ICD-10-CM

## 2015-04-08 DIAGNOSIS — I10 Essential (primary) hypertension: Secondary | ICD-10-CM

## 2015-04-08 NOTE — Progress Notes (Signed)
Cardiology Office Note Date:  04/08/2015   ID:  [name], DOB 11/11/1940, MRN [id number]  PCP:  Victoria Millers, MD  Cardiologist:  Victoria Mocha, MD    Chief Complaint  Patient presents with  . Chest Pain   History of Present Illness: Victoria [name] is a 74 y.o. female who presents for follow-up of coronary artery disease.  The patient had a non-ST MI in 2009 and was noted to have small vessel disease. She did have balloon angioplasty of a small circumflex branch. Her last functional study in 2012 showed no ischemia. She underwent cardiac catheterization this year for progressive anginal symptoms. This demonstrated moderate diffuse mid LAD stenosis. FFR analysis was borderline. She was treated medically. She has been unable to tolerate long-acting nitrates or metoprolol. She also has a hx of statin intolerance.  The patient was last seen in October 2015. At that time she was felt to be stable.  The patient experiences chest pain frequently. It is never related to physical exertion.  She experiences a dull ache in the center of her chest.  It usually occurs at rest and is relieved with either aspirin or nitroglycerin.  There is no radiation of pain and no nausea or diaphoresis. She reports no major change since I last saw her last. There is no associated shortness of breath. She is able to walk on the treadmill for 10-15 minutes without exertional symptoms.  Past Medical History  Diagnosis Date  . CAD (coronary artery disease)     unspecified site with NSTEMI 2009. Small OM treated with PCA  . Atopic eczema   . Osteoporosis     hx tailbone fx; bisphos >30yr tx, stopped 2011  . Hyperlipidemia     intol statin  . Hypothyroidism   . Essential hypertension, benign     white coat     dr Victoria [name]  . Myocardial infarction     2009  . Acute lumbar radiculopathy 04/2012    L L5-S1 s/p decompression surg    Past Surgical History  Procedure Laterality Date  . Appendectomy     . Tonsillectomy    . Carpal tunnel release      bilateral, dorsal release  . Hip surgery      in the 1980s with a staph infection postoperatively  . Laparoscopic hysterectomy    . Cardiac catheterization      2009  . Lumbar laminectomy/decompression microdiscectomy  04/17/2012    Procedure: LUMBAR LAMINECTOMY/DECOMPRESSION MICRODISCECTOMY 1 LEVEL;  Surgeon: Victoria Levine, MD;  Location: [facility];  Service: Neurosurgery;  Laterality: Left;  Left Lumbar five-Sacral one Microdiskectomy  . Left heart catheterization with coronary angiogram N/A 11/18/2013    Procedure: LEFT HEART CATHETERIZATION WITH CORONARY ANGIOGRAM;  Surgeon: Victoria Ohara, MD;  Location: [facility] C[facility] LAB;  Service: Cardiovascular;  Laterality: N/A;    Current Outpatient Prescriptions  Medication Sig Dispense Refill  . amLODipine (NORVASC) 5 MG tablet Take 1 tablet (5 mg total) by mouth daily. 90 tablet 3  . aspirin 81 MG tablet Take 81 mg by mouth daily.      Marland Kitchen atorvastatin (LIPITOR) 10 MG tablet Take 10 mg by mouth. Take 1 tablet once weekly    . Cholecalciferol (VITAMIN D) 2000 UNITS CAPS Take 1 capsule (2,000 Units total) by mouth daily. 30 capsule   . clobetasol (TEMOVATE) 0.05 % external solution Apply 1 application topically daily as needed. to scalp. 50 mL 1  . Coenzyme Q10 (CO Q-10)  300 MG CAPS Take 2 mLs by mouth daily.     . Desoximetasone (TOPICORT) 0.25 % ointment Apply 1 application topically 2 (two) times daily as needed (itching.). Apply to hands.    Marland Kitchen EPINEPHrine (EPIPEN) 0.3 mg/0.3 mL SOAJ injection Inject 0.3 mLs (0.3 mg total) into the muscle once. 1 Device 1  . levothyroxine (SYNTHROID) 88 MCG tablet Take 1 tablet (88 mcg total) by mouth daily before breakfast. 90 tablet 3  . Multiple Vitamins-Minerals (EYE VITAMINS) TABS Take 1 tablet by mouth daily.      . nitroGLYCERIN (NITROSTAT) 0.4 MG SL tablet Place 1 tablet (0.4 mg total) under the tongue every 5 (five) minutes as needed. For chest pain 25  tablet 11  . ZETIA 10 MG tablet TAKE 1 TABLET DAILY (PLEASECALL AND SCHEDULE AN       APPOINTMENT) 90 tablet 0   No current facility-administered medications for this visit.   Allergies:   Shellfish allergy; Codeine; Lanolin; Latex; Statins; Cephalexin; Ciprofloxacin; Erythromycin; Penicillins; Propylene glycol; Tetracycline; and Tetracyclines & related   Social History:  The patient  reports that she has never smoked. She does not have any smokeless tobacco history on file. She reports that she drinks alcohol. She reports that she does not use illicit drugs.   Family History:  The patient's family history includes Colon cancer (age of onset: 82) in her mother; Stroke in an other family member; Stroke (age of onset: 40) in her father.   ROS:  Please see the history of present illness.  Otherwise, review of systems is positive for chest pain, depression, muscle pain.  All other systems are reviewed and negative.   PHYSICAL EXAM: VS:  BP 136/84 mmHg  Pulse 69  Ht 5\' 4"  (1.626 m)  Wt 147 lb (66.679 kg)  BMI 25.22 kg/m2 , BMI Body mass index is 25.22 kg/(m^2). GEN: Well nourished, well developed, in no acute distress HEENT: normal Neck: no JVD, no masses. No carotid bruits Cardiac: RRR without murmur or gallop                Respiratory:  clear to auscultation bilaterally, normal work of breathing GI: soft, nontender, nondistended, + BS MS: no deformity or atrophy Ext: no pretibial edema, pedal pulses 2+= bilaterally Skin: warm and dry, no rash Neuro:  Strength and sensation are intact Psych: euthymic mood, full affect  EKG:  EKG is ordered today. The ekg ordered today shows  Normal sinus rhythm 69 bpm, left axis deviation , possible inferior infarct age undetermined , low-voltage QRS. There is no change from her last tracing 07/24/2014.  Recent Labs: 10/22/2014: ALT 16 01/09/2015: BUN 16; Creatinine, Ser 0.79; Hemoglobin 13.8; Platelets 267.0; Potassium 4.0; Sodium 138; TSH 0.17*    Lipid Panel     Component Value Date/Time   CHOL 144 10/22/2014 0838   TRIG 100.0 10/22/2014 0838   HDL 41.50 10/22/2014 0838   CHOLHDL 3 10/22/2014 0838   VLDL 20.0 10/22/2014 0838   LDLCALC 83 10/22/2014 0838      Wt Readings from Last 3 Encounters:  04/08/15 147 lb (66.679 kg)  01/09/15 145 lb 1.9 oz (65.826 kg)  10/06/14 148 lb 3.2 oz (67.223 kg)     Cardiac Studies Reviewed: Cardiac catheterization 11/18/2013: Procedural Findings: Hemodynamics: AO 126/68 with a mean of 94 LV 124/8  Coronary angiography: Coronary dominance: right  Left mainstem: The left main is moderately calcified. The vessel has 30% distal stenosis. There is no high-grade disease noted.  Left  anterior descending (LAD): The LAD is heavily calcified. The ostium has 20% stenosis. The proximal vessel has heavy calcification with no significant stenosis. At the first septal perforator there is 40-50% stenosis. The second diagonal branch there is 60-70% stenosis. There is a long area of segmental disease from the first perforators through the second diagonal branch. The mid and distal LAD have no significant obstructive disease beyond the second diagonal.  Left circumflex (LCx): The left circumflex is patent. The obtuse marginal at the site of previous balloon angioplasty is patent without significant residual stenosis. There are 4 OM branches with no significant disease the there is moderate circumflex calcification as well  Right coronary artery (RCA): The RCA is moderately calcified. There is mild nonobstructive plaque in the proximal vessel. The mid and distal vessel had no significant disease. The PDA and PLA branches are patent without significant disease.  Left ventriculography: Left ventricular systolic function is normal, LVEF is estimated at 55-65%, there is no significant mitral regurgitation   Final Conclusions:  1. Moderate diffuse mid LAD stenosis with borderline FFR of 0.80 2. Minor  nonobstructive disease in the left circumflex and right coronary arteries 3. Normal LV systolic function  Recommendations: The patient has some progression of LAD stenosis compared to her 2009 cath. Angiographically there is moderate effused disease through the midsegment. By pressure wire analysis there is borderline hemodynamic significance. I think medical therapy is indicated for an initial approach. She was just recently started on metoprolol 25 mg 3 times a day. I'm going to add Imdur 15 mg daily. She's had some sensitivity to medication in the past. We'll bring her back for early followup in medicine titration with Truitt Merle. If she fails medical therapy, PCI for LAD would be reasonable.  ASSESSMENT AND PLAN: 1.   CAD, native vessel, with chronic atypical angina: I reviewed the patient's cardiac catheterization films from last year.  She does have moderate LAD stenosis but I certainly would not expect this to cause resting anginal symptoms.  We discussed treatment options. I suspect she has either a component of microvascular angina or noncardiac chest pain. I offered her a prescription for ranolazine but she declined at this time. Consider this if symptoms worsen. Will continue current medical therapy and see her in follow-up in one year unless problems arise.   2. Hypercholesterolemia: Most recent lipids reviewed. She is only taking atorvastatin once per week, but also takes Zetia. Lipids reviewed as above and overall are close to goal.   Time spent conducting this visit today exceeds 30 minutes, greater than half of which was spent in discussion with the patient and her husband related to medical problems and treatment options outlined above.  Current medicines are reviewed with the patient today.  The patient does not have concerns regarding medicines.  Labs/ tests ordered today include:   Orders Placed This Encounter  Procedures  . Lipid panel  . Hepatic function panel  . EKG  12-Lead    Disposition:   FU one year  Signed, Victoria Mocha, MD  04/08/2015 5:33 PM    [facility] [address], [place], [place]  [address] Phone: [phone]; Fax: [phone]

## 2015-04-08 NOTE — Patient Instructions (Signed)
Medication Instructions:  Your physician recommends that you continue on your current medications as directed. Please refer to the Current Medication list given to you today.  Labwork: Your physician recommends that you return for a FASTING LIPID and LIVER profile in January 2017--nothing to eat or drink after midnight, lab opens at 7:30 AM  Testing/Procedures: No new orders.  No further carotid duplex recommended at this time.   Follow-Up: Your physician wants you to follow-up in: 1 YEAR with Dr Burt Knack.  You will receive a reminder letter in the mail two months in advance. If you don't receive a letter, please call our office to schedule the follow-up appointment.   Any Other Special Instructions Will Be Listed Below (If Applicable).

## 2015-04-16 DIAGNOSIS — H2513 Age-related nuclear cataract, bilateral: Secondary | ICD-10-CM | POA: Diagnosis not present

## 2015-04-16 DIAGNOSIS — H40013 Open angle with borderline findings, low risk, bilateral: Secondary | ICD-10-CM | POA: Diagnosis not present

## 2015-07-17 ENCOUNTER — Other Ambulatory Visit: Payer: Self-pay | Admitting: *Deleted

## 2015-07-17 MED ORDER — ATORVASTATIN CALCIUM 10 MG PO TABS: 10.0000 mg | ORAL_TABLET | ORAL | Status: DC

## 2015-08-10 ENCOUNTER — Other Ambulatory Visit: Payer: Self-pay | Admitting: Cardiovascular Disease

## 2015-08-18 DIAGNOSIS — Z23 Encounter for immunization: Secondary | ICD-10-CM | POA: Diagnosis not present

## 2015-10-01 ENCOUNTER — Encounter: Payer: Self-pay | Admitting: *Deleted

## 2015-10-06 ENCOUNTER — Other Ambulatory Visit (INDEPENDENT_AMBULATORY_CARE_PROVIDER_SITE_OTHER): Payer: Medicare Other | Admitting: *Deleted

## 2015-10-06 DIAGNOSIS — I10 Essential (primary) hypertension: Secondary | ICD-10-CM | POA: Diagnosis not present

## 2015-10-06 DIAGNOSIS — E785 Hyperlipidemia, unspecified: Secondary | ICD-10-CM | POA: Diagnosis not present

## 2015-10-06 LAB — LIPID PANEL
Cholesterol: 155 mg/dL (ref 125–200)
HDL: 45 mg/dL — ABNORMAL LOW (ref >=46)
LDL Cholesterol: 85 mg/dL (ref <130)
Total CHOL/HDL Ratio: 3.4 Ratio (ref <=5.0)
Triglycerides: 124 mg/dL (ref <150)
VLDL: 25 mg/dL (ref <30)

## 2015-10-06 LAB — HEPATIC FUNCTION PANEL
ALT: 22 U/L (ref 6–29)
AST: 22 U/L (ref 10–35)
Albumin: 4 g/dL (ref 3.6–5.1)
Alkaline Phosphatase: 79 U/L (ref 33–130)
BILIRUBIN DIRECT: 0.1 mg/dL (ref <=0.2)
BILIRUBIN INDIRECT: 0.5 mg/dL (ref 0.2–1.2)
Total Bilirubin: 0.6 mg/dL (ref 0.2–1.2)
Total Protein: 7.5 g/dL (ref 6.1–8.1)

## 2015-10-06 NOTE — Addendum Note (Signed)
Addended by: Eulis Foster on: 10/06/2015 07:53 AM   Modules accepted: Orders

## 2016-02-10 ENCOUNTER — Other Ambulatory Visit: Payer: Self-pay | Admitting: Cardiovascular Disease

## 2016-02-11 ENCOUNTER — Other Ambulatory Visit (HOSPITAL_COMMUNITY): Payer: Self-pay | Admitting: *Deleted

## 2016-03-05 ENCOUNTER — Other Ambulatory Visit: Payer: Self-pay | Admitting: Internal Medicine

## 2016-04-14 ENCOUNTER — Telehealth: Payer: Self-pay | Admitting: Cardiovascular Disease

## 2016-04-14 ENCOUNTER — Other Ambulatory Visit: Payer: Self-pay | Admitting: *Deleted

## 2016-04-14 DIAGNOSIS — I10 Essential (primary) hypertension: Secondary | ICD-10-CM

## 2016-04-14 MED ORDER — AMLODIPINE BESYLATE 5 MG PO TABS: 5.0000 mg | ORAL_TABLET | Freq: Every day | ORAL | Status: DC

## 2016-04-14 NOTE — Telephone Encounter (Signed)
°*  STAT* If patient is at the pharmacy, call can be transferred to refill team.   1. Which medications need to be refilled? (please list name of each medication and dose if known) prescriptions Amlodipine until her appt on 05-03-16 with Estella Husk 2. Which pharmacy/location (including street and city if local pharmacy) is medication to be sent to?[facility] 3. Do they need a 30 day or 90 day supply? She needs enough 05-03-16

## 2016-05-03 ENCOUNTER — Encounter: Payer: Self-pay | Admitting: Physician Assistant

## 2016-05-03 ENCOUNTER — Ambulatory Visit (INDEPENDENT_AMBULATORY_CARE_PROVIDER_SITE_OTHER): Payer: Medicare Other | Admitting: Physician Assistant

## 2016-05-03 VITALS — BP 118/72 | HR 78 | Ht 63.0 in | Wt 149.1 lb

## 2016-05-03 DIAGNOSIS — I10 Essential (primary) hypertension: Secondary | ICD-10-CM | POA: Diagnosis not present

## 2016-05-03 DIAGNOSIS — E785 Hyperlipidemia, unspecified: Secondary | ICD-10-CM | POA: Diagnosis not present

## 2016-05-03 DIAGNOSIS — I2510 Atherosclerotic heart disease of native coronary artery without angina pectoris: Secondary | ICD-10-CM | POA: Diagnosis not present

## 2016-05-03 MED ORDER — EZETIMIBE 10 MG PO TABS: 10.0000 mg | ORAL_TABLET | Freq: Every day | ORAL | 3 refills | Status: DC

## 2016-05-03 MED ORDER — AMLODIPINE BESYLATE 5 MG PO TABS: 5.0000 mg | ORAL_TABLET | Freq: Every day | ORAL | 3 refills | Status: DC

## 2016-05-03 MED ORDER — ATORVASTATIN CALCIUM 10 MG PO TABS: 10.0000 mg | ORAL_TABLET | ORAL | 3 refills | Status: DC

## 2016-05-03 NOTE — Patient Instructions (Addendum)
Medication Instructions:  Your physician recommends that you continue on your current medications as directed. Please refer to the Current Medication list given to you today.   Labwork: Your physician recommends that you return for fasting lab work a few days before your appointment with Dr Burt Knack in August  2018. --lipid profile/liver profile.   Testing/Procedures: None   Follow-Up: Your physician wants you to follow-up in:  1 year with Dr Burt Knack. (August 2018).  You will receive a reminder letter in the mail two months in advance. If you don't receive a letter, please call our office to schedule the follow-up appointment.        If you need a refill on your cardiac medications before your next appointment, please call your pharmacy.

## 2016-05-03 NOTE — Progress Notes (Signed)
Cardiology Office Note    Date:  05/03/2016   ID:  BEEBE COUNCE, DOB 16-Oct-1940, MRN [id number]  PCP:  Hoyt Koch, MD  Cardiologist: Dr. Burt Knack  Chief Complaint  Patient presents with  . Follow-up  . Chest Pain    sometimes, but its nothing different than whet she been having, easily resolved 2 aspirin     History of Present Illness:  Victoria [name] is a 75 y.o. female with history of CAD.The patient had a non-ST MI in 2009 and was noted to have small vessel disease. She did have balloon angioplasty of a small circumflex branch. Her last functional study in 2012 showed no ischemia. She underwent cardiac catheterization this year for progressive anginal symptoms. This demonstrated moderate diffuse mid LAD stenosis. FFR analysis was borderline. She was treated medically. She has been unable to tolerate long-acting nitrates or metoprolol.  She also has a hx of statin intolerance.  The patient was last seen in October 2015. At that time she was felt to be stable.    Patient has had chronic exertional chest tightness over the years.Chest tightness is much better. Occasionally awakens with chest tightness relieved with 2 asa but happens rarely now. Walks 1/2 mile couple times a week, does gardening and and housework.she feels really good.    Past Medical History:  Diagnosis Date  . Acute lumbar radiculopathy 04/2012   L L5-S1 s/p decompression surg  . Atopic eczema   . CAD (coronary artery disease)    unspecified site with NSTEMI 2009. Small OM treated with PCA  . Essential hypertension, benign    white coat     dr cooper  . Hyperlipidemia    intol statin  . Hypothyroidism   . Myocardial infarction ([place])    2009  . Osteoporosis    hx tailbone fx; bisphos >63yr tx, stopped 2011    Past Surgical History:  Procedure Laterality Date  . APPENDECTOMY    . CARDIAC CATHETERIZATION     2009  . CARPAL TUNNEL RELEASE     bilateral, dorsal release  . HIP SURGERY       in the 1980s with a staph infection postoperatively  . LAPAROSCOPIC HYSTERECTOMY    . LEFT HEART CATHETERIZATION WITH CORONARY ANGIOGRAM N/A 11/18/2013   Procedure: LEFT HEART CATHETERIZATION WITH CORONARY ANGIOGRAM;  Surgeon: Blane Ohara, MD;  Location: [facility] C[facility] LAB;  Service: Cardiovascular;  Laterality: N/A;  . LUMBAR LAMINECTOMY/DECOMPRESSION MICRODISCECTOMY  04/17/2012   Procedure: LUMBAR LAMINECTOMY/DECOMPRESSION MICRODISCECTOMY 1 LEVEL;  Surgeon: Erline Levine, MD;  Location: [facility];  Service: Neurosurgery;  Laterality: Left;  Left Lumbar five-Sacral one Microdiskectomy  . TONSILLECTOMY      Current Medications: Outpatient Medications Prior to Visit  Medication Sig Dispense Refill  . aspirin 81 MG tablet Take 81 mg by mouth daily.      . Cholecalciferol (VITAMIN D) 2000 UNITS CAPS Take 1 capsule (2,000 Units total) by mouth daily. 30 capsule   . clobetasol (TEMOVATE) 0.05 % external solution Apply 1 application topically daily as needed. to scalp. 50 mL 1  . Coenzyme Q10 (CO Q-10) 300 MG CAPS Take 2 mLs by mouth daily.     . Desoximetasone (TOPICORT) 0.25 % ointment Apply 1 application topically 2 (two) times daily as needed (itching.). Apply to hands.    Marland Kitchen EPINEPHrine (EPIPEN) 0.3 mg/0.3 mL SOAJ injection Inject 0.3 mLs (0.3 mg total) into the muscle once. 1 Device 1  . Multiple Vitamins-Minerals (EYE VITAMINS)  TABS Take 1 tablet by mouth daily.      . nitroGLYCERIN (NITROSTAT) 0.4 MG SL tablet Place 1 tablet (0.4 mg total) under the tongue every 5 (five) minutes as needed. For chest pain 25 tablet 11  . SYNTHROID 88 MCG tablet TAKE 1 TABLET DAILY BEFORE BREAKFAST 90 tablet 3  . amLODipine (NORVASC) 5 MG tablet Take 1 tablet (5 mg total) by mouth daily. 90 tablet 0  . atorvastatin (LIPITOR) 10 MG tablet Take 1 tablet (10 mg total) by mouth once a week. Take 1 tablet once weekly 48 tablet 0  . ezetimibe (ZETIA) 10 MG tablet TAKE 1 TABLET DAILY 90 tablet 1   No  facility-administered medications prior to visit.      Allergies:   Shellfish allergy; Codeine; Lanolin; Latex; Statins; Cephalexin; Ciprofloxacin; Erythromycin; Penicillins; Propylene glycol; Tetracycline; and Tetracyclines & related   Social History   Social History  . Marital status: Married    Spouse name: N/A  . Number of children: 2  . Years of education: N/A   Occupational History  . retired    Social History Main Topics  . Smoking status: Never Smoker  . Smokeless tobacco: Not on file  . Alcohol use 0.0 oz/week     Comment: 1 glass of wine daily  . Drug use: No  . Sexual activity: Not on file   Other Topics Concern  . Not on file   Social History Narrative  . No narrative on file     Family History:  The patient's family history includes Colon cancer (age of onset: 25) in her mother; Stroke (age of onset: 56) in her father.   ROS:   Please see the history of present illness.    Review of Systems  Constitution: Negative.  HENT: Negative.   Eyes: Negative.   Cardiovascular: Negative.   Respiratory: Negative.   Hematologic/Lymphatic: Negative.   Musculoskeletal: Negative.  Negative for joint pain.  Gastrointestinal: Negative.   Genitourinary: Negative.   Neurological: Negative.    All other systems reviewed and are negative.   PHYSICAL EXAM:   VS:  BP 118/72 (BP Location: Right Arm, Patient Position: Sitting, Cuff Size: Normal)   Pulse 78   Ht 5\' 3"  (1.6 m)   Wt 149 lb 1.9 oz (67.6 kg)   SpO2 97%   BMI 26.42 kg/m   Physical Exam  GEN: Well nourished, well developed, in no acute distress  Neck: no JVD, carotid bruits, or masses Cardiac:RRR; no murmurs, rubs, or gallops  Respiratory:  clear to auscultation bilaterally, normal work of breathing GI: soft, nontender, nondistended, + BS Ext: without cyanosis, clubbing, or edema, Good distal pulses bilaterally MS: no deformity or atrophy  Skin: warm and dry, no rash Neuro:  Alert and Oriented x 3,  Strength and sensation are intact Psych: euthymic mood, full affect  Wt Readings from Last 3 Encounters:  05/03/16 149 lb 1.9 oz (67.6 kg)  04/08/15 147 lb (66.7 kg)  01/09/15 145 lb 1.9 oz (65.8 kg)      Studies/Labs Reviewed:   EKG:  EKG is not ordered today.   Recent Labs: 10/06/2015: ALT 22   Lipid Panel    Component Value Date/Time   CHOL 155 10/06/2015 0753   TRIG 124 10/06/2015 0753   HDL 45 (L) 10/06/2015 0753   CHOLHDL 3.4 10/06/2015 0753   VLDL 25 10/06/2015 0753   LDLCALC 85 10/06/2015 0753    Additional studies/ records that were reviewed today include:  Cardiac catheterization 11/18/2013: Procedural Findings: Hemodynamics: AO 126/68 with a mean of 94 LV 124/8  Coronary angiography: Coronary dominance: right  Left mainstem: The left main is moderately calcified. The vessel has 30% distal stenosis. There is no high-grade disease noted.  Left anterior descending (LAD): The LAD is heavily calcified. The ostium has 20% stenosis. The proximal vessel has heavy calcification with no significant stenosis. At the first septal perforator there is 40-50% stenosis. The second diagonal branch there is 60-70% stenosis. There is a long area of segmental disease from the first perforators through the second diagonal branch. The mid and distal LAD have no significant obstructive disease beyond the second diagonal.  Left circumflex (LCx): The left circumflex is patent. The obtuse marginal at the site of previous balloon angioplasty is patent without significant residual stenosis. There are 4 OM branches with no significant disease the there is moderate circumflex calcification as well  Right coronary artery (RCA): The RCA is moderately calcified. There is mild nonobstructive plaque in the proximal vessel. The mid and distal vessel had no significant disease. The PDA and PLA branches are patent without significant disease.  Left ventriculography: Left ventricular systolic  function is normal, LVEF is estimated at 55-65%, there is no significant mitral regurgitation   Final Conclusions:  1. Moderate diffuse mid LAD stenosis with borderline FFR of 0.80 2. Minor nonobstructive disease in the left circumflex and right coronary arteries 3. Normal LV systolic function  Recommendations: The patient has some progression of LAD stenosis compared to her 2009 cath. Angiographically there is moderate effused disease through the midsegment. By pressure wire analysis there is borderline hemodynamic significance. I think medical therapy is indicated for an initial approach. She was just recently started on metoprolol 25 mg 3 times a day. I'm going to add Imdur 15 mg daily. She's had some sensitivity to medication in the past. We'll bring her back for early followup in medicine titration with Truitt Merle. If she fails medical therapy, PCI for LAD would be reasonable.   ASSESSMENT:    1. Atherosclerosis of native coronary artery of native heart without angina pectoris   2. Essential hypertension   3. Hyperlipidemia      PLAN:  In order of problems listed above:  CAD stable with decrease in angina. Continue current therapy. Try to increase amount of exercise daily.Follow-up with Dr. Burt Knack in one year  Essential hypertension well controlled continue amlodipine  Hyperlipidemia lipid panel in January was stable continue Lipitor and Zetia.      Medication Adjustments/Labs and Tests Ordered: Current medicines are reviewed at length with the patient today.  Concerns regarding medicines are outlined above.  Medication changes, Labs and Tests ordered today are listed in the Patient Instructions below. There are no Patient Instructions on file for this visit.   Sumner Boast, PA-C  05/03/2016 9:12 AM    [facility] [place], [place], [place]  [address] Phone: [phone]; Fax: [phone]

## 2016-05-17 ENCOUNTER — Other Ambulatory Visit (INDEPENDENT_AMBULATORY_CARE_PROVIDER_SITE_OTHER): Payer: Medicare Other

## 2016-05-17 ENCOUNTER — Ambulatory Visit (INDEPENDENT_AMBULATORY_CARE_PROVIDER_SITE_OTHER): Payer: Medicare Other | Admitting: Internal Medicine

## 2016-05-17 ENCOUNTER — Ambulatory Visit (INDEPENDENT_AMBULATORY_CARE_PROVIDER_SITE_OTHER)
Admission: RE | Admit: 2016-05-17 | Discharge: 2016-05-17 | Disposition: A | Discharge status: Home / Self Care | Payer: Medicare Other | Source: Ambulatory Visit | Attending: Internal Medicine | Admitting: Internal Medicine

## 2016-05-17 ENCOUNTER — Encounter: Payer: Self-pay | Admitting: Internal Medicine

## 2016-05-17 VITALS — BP 140/80 | HR 82 | Temp 98.0°F | Ht 64.0 in | Wt 151.0 lb

## 2016-05-17 DIAGNOSIS — M7061 Trochanteric bursitis, right hip: Secondary | ICD-10-CM

## 2016-05-17 DIAGNOSIS — R938 Abnormal findings on diagnostic imaging of other specified body structures: Secondary | ICD-10-CM

## 2016-05-17 DIAGNOSIS — I2510 Atherosclerotic heart disease of native coronary artery without angina pectoris: Secondary | ICD-10-CM

## 2016-05-17 DIAGNOSIS — E785 Hyperlipidemia, unspecified: Secondary | ICD-10-CM

## 2016-05-17 DIAGNOSIS — E039 Hypothyroidism, unspecified: Secondary | ICD-10-CM

## 2016-05-17 DIAGNOSIS — R7301 Impaired fasting glucose: Secondary | ICD-10-CM | POA: Diagnosis not present

## 2016-05-17 DIAGNOSIS — N907 Vulvar cyst: Secondary | ICD-10-CM

## 2016-05-17 DIAGNOSIS — Z0000 Encounter for general adult medical examination without abnormal findings: Secondary | ICD-10-CM

## 2016-05-17 DIAGNOSIS — M47812 Spondylosis without myelopathy or radiculopathy, cervical region: Secondary | ICD-10-CM | POA: Diagnosis not present

## 2016-05-17 DIAGNOSIS — R9389 Abnormal findings on diagnostic imaging of other specified body structures: Secondary | ICD-10-CM

## 2016-05-17 DIAGNOSIS — I10 Essential (primary) hypertension: Secondary | ICD-10-CM | POA: Diagnosis not present

## 2016-05-17 DIAGNOSIS — N9089 Other specified noninflammatory disorders of vulva and perineum: Secondary | ICD-10-CM

## 2016-05-17 LAB — COMPREHENSIVE METABOLIC PANEL
ALBUMIN: 4.6 g/dL (ref 3.5–5.2)
ALK PHOS: 81 U/L (ref 39–117)
ALT: 22 U/L (ref 0–35)
AST: 19 U/L (ref 0–37)
BILIRUBIN TOTAL: 0.5 mg/dL (ref 0.2–1.2)
BUN: 15 mg/dL (ref 6–23)
CO2: 26 mEq/L (ref 19–32)
Calcium: 11 mg/dL — ABNORMAL HIGH (ref 8.4–10.5)
Chloride: 106 mEq/L (ref 96–112)
Creatinine, Ser: 0.74 mg/dL (ref 0.40–1.20)
GFR: 81.29 mL/min (ref >60.00)
Glucose, Bld: 88 mg/dL (ref 70–99)
POTASSIUM: 4.1 meq/L (ref 3.5–5.1)
Sodium: 139 mEq/L (ref 135–145)
TOTAL PROTEIN: 8.1 g/dL (ref 6.0–8.3)

## 2016-05-17 LAB — HEMOGLOBIN A1C: Hgb A1c MFr Bld: 5.5 % (ref 4.6–6.5)

## 2016-05-17 LAB — TSH: TSH: 0.52 u[IU]/mL (ref 0.35–4.50)

## 2016-05-17 LAB — T4, FREE: Free T4: 1.21 ng/dL (ref 0.60–1.60)

## 2016-05-17 NOTE — Progress Notes (Signed)
   Subjective:    Patient ID: Victoria [name], female    DOB: 02-11-1941, 75 y.o.   MRN: [id number]  HPI The patient is a 75 YO female coming in for several complaints. She is having some bumps on her perineum (noticed months to year ago and they are not growing, not painful or itchy, no discharge, no new sexual partners) and her thyroid (taking her synthroid and no symptoms of over or under replacement), and her right hip pain (some pain over the trochanteric bursa, going on previously and had resolved on its own, now is back for the last several weeks, worse with lying on it, has not taken anything for it, disturbing her sleep) as well as her blood pressure (taking amlodipine and BP at goal at home, no chest pains or SOB). No other new complaints. Wants full skin exam.   Review of Systems  Constitutional: Negative for activity change, appetite change, fatigue, fever and unexpected weight change.  HENT: Negative.   Eyes: Negative.   Respiratory: Negative for cough, chest tightness, shortness of breath and wheezing.   Cardiovascular: Negative for chest pain, palpitations and leg swelling.  Gastrointestinal: Negative for abdominal distention, abdominal pain, constipation and diarrhea.  Genitourinary: Positive for genital sores. Negative for dysuria, flank pain, vaginal bleeding, vaginal discharge and vaginal pain.  Musculoskeletal: Positive for arthralgias and myalgias. Negative for back pain and gait problem.  Skin: Negative.   Neurological: Negative.   Psychiatric/Behavioral: Negative.       Objective:   Physical Exam  Constitutional: She is oriented to person, place, and time. She appears well-developed and well-nourished.  HENT:  Head: Normocephalic and atraumatic.  Eyes: EOM are normal.  Neck: Normal range of motion.  Cardiovascular: Normal rate and regular rhythm.   Pulmonary/Chest: Effort normal and breath sounds normal.  Abdominal: Soft. Bowel sounds are normal. She exhibits no  distension. There is no tenderness. There is no rebound and no guarding.  Genitourinary:  Genitourinary Comments: Some skin tags around the inguinal area on the left and right, no vaginal or labial abnormalities. No signs of infection or lesion.   Musculoskeletal: She exhibits tenderness.  Tenderness on the lateral right hip, no pain over the groin area.   Neurological: She is alert and oriented to person, place, and time. Coordination normal.  Skin: Skin is warm and dry. No rash noted. No erythema.  Full skin exam without worrisome lesions   Vitals:   05/17/16 0917  BP: 140/80  Pulse: 82  Temp: 98 F (36.7 C)  TempSrc: Oral  SpO2: 97%  Weight: 151 lb (68.5 kg)  Height: 5\' 4"  (1.626 m)      Assessment & Plan:

## 2016-05-17 NOTE — Assessment & Plan Note (Signed)
Taking amlodipine 5 mg daily and BP at goal. Checking CMP and adjust as needed.

## 2016-05-17 NOTE — Assessment & Plan Note (Signed)
Checking lipid panel today. Not taking lipitor daily (maybe a couple times per week). Adjust as needed.

## 2016-05-17 NOTE — Assessment & Plan Note (Signed)
She will try voltaren gel which she has at home. If it is effective we will send in rx.

## 2016-05-17 NOTE — Assessment & Plan Note (Signed)
Not worrisome and several. No STI appearance or discharge. Reassurance given.

## 2016-05-17 NOTE — Patient Instructions (Addendum)
Try the voltaren gel on the hip for the pain. We are checking the labs today.   Victoria [name] , Thank you for taking time to come for your Medicare Wellness Visit. I appreciate your ongoing commitment to your health goals. Please review the following plan we discussed and let me know if I can assist you in the future.   Bring a copy of your Health Care POA piece or Living will to be copied to the chart   Will start core exercises;   Will refill synthroid and Clobetasol   Try [facility] or other durable medical equip Try [employer] or [employer] for mask   Please let us know when you take flu so we can add it to your record     These are the goals we discussed: Goals    . Exercise 150 minutes per week (moderate activity)          To get on the treadmill 5 days a week  To add strengthening; core work as discussed     . LDL CALC < 70       This is a list of the screening recommended for you and due dates:  Health Maintenance  Topic Date Due  . Flu Shot  05/03/2016  . Colon Cancer Screening  12/02/2020  . Tetanus Vaccine  01/08/2023  . DEXA scan (bone density measurement)  Completed  . Shingles Vaccine  Completed  . Pneumonia vaccines  Completed

## 2016-05-17 NOTE — Assessment & Plan Note (Signed)
Checking TSH and free T4 and adjust her synthroid 88 mcg daily as needed.

## 2016-05-17 NOTE — Progress Notes (Signed)
Medical screening examination/treatment/procedure(s) were performed by non-physician practitioner and as supervising physician I was immediately available for consultation/collaboration. I agree with above. Saraiah Bhat A Ceniyah Thorp, MD 

## 2016-05-17 NOTE — Progress Notes (Signed)
Subjective:   Victoria [name] is a 75 y.o. female who presents for Medicare Annual (Subsequent) preventive examination.  Review of Systems:   Here for AWV  Describes health: cardiologist thinks she is doing great  Heart is doing good;   Lifestyle  Married;  Children; one is in the area and one in [place]; 1 [place] and one in [place]  hdl 45; LDL 85; Trig 124  Osteoporosis; was on medicine for 10 years;  Lost 2 family members and 3 friends   Issues today  Does have an issue with hip  Bumps around her perineum  Check skin for any issues; hx of chronic dermatitis  Allergies; same;  Had episode  of barely breathing when cleaning a shed   Diet  Breakfast; bowl of cereal and cup of tea Lunch; baked ham with swiss cheese  On rye; tomato sandwiches Supper; Eat in  Occasionally go out Fruit and vegetable;  Fish x 1 per week Smaller portions;    Exercise Not an exercise person Does get on the treadmill x 15 minutes 3 times a week Does her house work;  Does her own yard work 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;  genealogy - now Estate agent autobiographies Winded with stairs    SAFETY: ranch style home Generalized Safety in the home reviewed/  Removing clutter and tripping hazzards, railings on stairs and grab bars in the bathroom; good lighting; annual eye exam; review of medications  Falls assessed; mobility changes; balance changes; up at hs to the bathroom; medications;  No falls;  Feels balance is not as good at hs Work on core strength; educated on use of rubber bands   Urinary or fecal incont; no issues   Educated on prevention falls; Exercise, toning and strengthening; Balance exercises  Wear Comfortable shoes;   Home Safety Tips for Older Adults given  Review Firearm safety as appropriate;  Smoke detectors; yes Assessed for community safety; yes  Driving: no  Sun protection; the only time she stays in the sun is 82min per day   could not  tolerate statins; had some memory loss;  Is now on Zetia; now takes statin x 1 a week   Hearing: 4000hz  both ears   Vision: generally has them checked x 1 per year Wears glasses to drive/ august of every year [facility]   Overdue screens; Flu when available   Colonoscopy 12/2020 Cardiac Risk Factors include: advanced age (>36men, >7 women);dyslipidemia;hypertension;family history of premature cardiovascular diseaseEKG 04/2015 Bone density  01/2014  -2.8;      Objective:     Vitals: BP 140/80   Pulse 82   Temp 98 F (36.7 C) (Oral)   Ht 5\' 4"  (1.626 m)   Wt 151 lb (68.5 kg)   SpO2 97%   BMI 25.92 kg/m   Body mass index is 25.92 kg/m.   Tobacco History  Smoking Status  . Never Smoker  Smokeless Tobacco  . Not on file     Counseling given: Not Answered   Past Medical History:  Diagnosis Date  . Acute lumbar radiculopathy 04/2012   L L5-S1 s/p decompression surg  . Atopic eczema   . CAD (coronary artery disease)    unspecified site with NSTEMI 2009. Small OM treated with PCA  . Essential hypertension, benign    white coat     dr cooper  . Hyperlipidemia    intol statin  . Hypothyroidism   . Myocardial infarction ([place])    2009  .  Osteoporosis    hx tailbone fx; bisphos >31yr tx, stopped 2011   Past Surgical History:  Procedure Laterality Date  . APPENDECTOMY    . CARDIAC CATHETERIZATION     2009  . CARPAL TUNNEL RELEASE     bilateral, dorsal release  . HIP SURGERY     in the 1980s with a staph infection postoperatively  . LAPAROSCOPIC HYSTERECTOMY    . LEFT HEART CATHETERIZATION WITH CORONARY ANGIOGRAM N/A 11/18/2013   Procedure: LEFT HEART CATHETERIZATION WITH CORONARY ANGIOGRAM;  Surgeon: Blane Ohara, MD;  Location: [facility] CATH LAB;  Service: Cardiovascular;  Laterality: N/A;  . LUMBAR LAMINECTOMY/DECOMPRESSION MICRODISCECTOMY  04/17/2012   Procedure: LUMBAR LAMINECTOMY/DECOMPRESSION MICRODISCECTOMY 1 LEVEL;  Surgeon: Erline Levine, MD;   Location: [facility] NEURO ORS;  Service: Neurosurgery;  Laterality: Left;  Left Lumbar five-Sacral one Microdiskectomy  . TONSILLECTOMY     Family History  Problem Relation Age of Onset  . Colon cancer Mother 42    1st degree  . Stroke Father 24  . Stroke      Grandfather- grandmother   History  Sexual Activity  . Sexual activity: Not on file    Outpatient Encounter Prescriptions as of 05/17/2016  Medication Sig  . amLODipine (NORVASC) 5 MG tablet Take 1 tablet (5 mg total) by mouth daily.  Marland Kitchen aspirin 81 MG tablet Take 81 mg by mouth daily.    Marland Kitchen atorvastatin (LIPITOR) 10 MG tablet Take 1 tablet (10 mg total) by mouth once a week.  . Cholecalciferol (VITAMIN D) 2000 UNITS CAPS Take 1 capsule (2,000 Units total) by mouth daily.  . clobetasol (TEMOVATE) 0.05 % external solution Apply 1 application topically daily as needed. to scalp.  . Coenzyme Q10 (CO Q-10) 300 MG CAPS Take 2 mLs by mouth daily.   . Desoximetasone (TOPICORT) 0.25 % ointment Apply 1 application topically 2 (two) times daily as needed (itching.). Apply to hands.  Marland Kitchen EPINEPHrine (EPIPEN) 0.3 mg/0.3 mL SOAJ injection Inject 0.3 mLs (0.3 mg total) into the muscle once.  . ezetimibe (ZETIA) 10 MG tablet Take 1 tablet (10 mg total) by mouth daily.  . Multiple Vitamins-Minerals (EYE VITAMINS) TABS Take 1 tablet by mouth daily.    . nitroGLYCERIN (NITROSTAT) 0.4 MG SL tablet Place 1 tablet (0.4 mg total) under the tongue every 5 (five) minutes as needed. For chest pain  . SYNTHROID 88 MCG tablet TAKE 1 TABLET DAILY BEFORE BREAKFAST   No facility-administered encounter medications on file as of 05/17/2016.     Activities of Daily Living In your present state of health, do you have any difficulty performing the following activities: 05/17/2016  Hearing? N  Vision? N  Difficulty concentrating or making decisions? N  Walking or climbing stairs? Y  Dressing or bathing? N  Doing errands, shopping? N  Preparing Food and eating ? N    Using the Toilet? N  In the past six months, have you accidently leaked urine? N  Do you have problems with loss of bowel control? N  Managing your Medications? N  Managing your Finances? N  Housekeeping or managing your Housekeeping? N  Some recent data might be hidden    Patient Care Team: Hoyt Koch, MD as PCP - General (Internal Medicine) [name], MD (Cardiology) Erline Levine, MD (Neurosurgery) Ronald Lobo, MD (Gastroenterology)    Assessment:     Exercise Activities and Dietary recommendations Current Exercise Habits: Home exercise routine, Time (Minutes): 30, Frequency (Times/Week): 4, Weekly Exercise (Minutes/Week): 120, Intensity: Moderate  Goals    . Exercise 150 minutes per week (moderate activity)          To get on the treadmill 5 days a week  To add strengthening; core work as discussed     . LDL CALC < 70      Fall Risk Fall Risk  05/17/2016 01/23/2014 01/07/2013  Falls in the past year? No No No   Depression Screen PHQ 2/9 Scores 05/17/2016 01/23/2014 01/07/2013  PHQ - 2 Score 0 1 0     Cognitive Testing MMSE - Mini Mental State Exam 05/17/2016  Not completed: (No Data)    Immunization History  Administered Date(s) Administered  . Influenza Split 08/23/2012  . Influenza,inj,Quad PF,36+ Mos 09/30/2014  . Influenza-Unspecified 10/07/2013  . Pneumococcal Conjugate-13 01/09/2015  . Pneumococcal-Unspecified 09/13/2006  . Tdap 01/07/2013  . Zoster 10/05/2009   Screening Tests Health Maintenance  Topic Date Due  . INFLUENZA VACCINE  05/03/2016  . COLONOSCOPY  12/02/2020  . TETANUS/TDAP  01/08/2023  . DEXA SCAN  Completed  . ZOSTAVAX  Completed  . PNA vac Low Risk Adult  Completed      Plan:   To see Dr. Sharlet Salina Strength bearing exercise   During the course of the visit the patient was educated and counseled about the following appropriate screening and preventive services:   Vaccines to include Pneumoccal, Influenza,  Hepatitis B, Td, Zostavax, HCV  Electrocardiogram  Cardiovascular Disease  Colorectal cancer screening  Bone density screening  Diabetes screening  Glaucoma screening  Mammography/PAP  Nutrition counseling   Patient Instructions (the written plan) was given to the patient.   Wynetta Fines, RN  05/17/2016

## 2016-07-02 ENCOUNTER — Other Ambulatory Visit: Payer: Self-pay | Admitting: Physician Assistant

## 2016-07-02 DIAGNOSIS — I10 Essential (primary) hypertension: Secondary | ICD-10-CM

## 2016-07-03 ENCOUNTER — Encounter: Payer: Self-pay | Admitting: Student

## 2016-10-24 ENCOUNTER — Encounter: Payer: Self-pay | Admitting: Cardiovascular Disease

## 2016-10-24 NOTE — Telephone Encounter (Signed)
This encounter was created in error - please disregard.

## 2016-10-24 NOTE — Telephone Encounter (Signed)
F/U  Patient states she has not received the signed form from Dr. Burt Knack that she spoke with you about a couple of weeks ago

## 2017-02-16 ENCOUNTER — Other Ambulatory Visit: Payer: Self-pay | Admitting: Internal Medicine

## 2017-02-22 ENCOUNTER — Telehealth: Payer: Self-pay | Admitting: Cardiovascular Disease

## 2017-02-22 NOTE — Telephone Encounter (Signed)
New message    Pt is calling asking for a recommendation for a primary care physician. Her primary care doctor has left the area, and the doctor that was recommended she does not like.

## 2017-02-24 NOTE — Telephone Encounter (Signed)
Pt was previously seen by Dr Asa Lente and then she left the area. The pt has since followed up with other physicians at the [facility] location and she does not feel like things are working with the doctor patient relationship.  The pt would like any recommendations from Dr Burt Knack.  Her first preference would be to have a PCP who is part of the [facility] system and has access to her records but she is willing to go outside of [facility].  I will forward this message to Dr Burt Knack to get his thoughts on a PCP.

## 2017-03-01 NOTE — Telephone Encounter (Signed)
Left message on machine for pt to contact the office.   

## 2017-03-01 NOTE — Telephone Encounter (Signed)
Shanon Ace is great - not sure if she's taking new patients. She's in the [place] office.

## 2017-03-02 NOTE — Telephone Encounter (Signed)
Follow up ° °Pt voiced returning nurses call. ° °Please f/u °

## 2017-03-02 NOTE — Telephone Encounter (Signed)
I spoke with the pt and made her aware that Dr Burt Knack had recommended Dr Regis Bill. She plans on contacting the [place] office today.

## 2017-03-17 NOTE — Telephone Encounter (Addendum)
I am not taking new adult  patients  Or transfers  . At this time. Please relate this to patient if she  Requests  Thanks  [facility]

## 2017-04-07 ENCOUNTER — Other Ambulatory Visit: Payer: Self-pay | Admitting: Internal Medicine

## 2017-04-13 ENCOUNTER — Encounter: Payer: Self-pay | Admitting: Family Medicine

## 2017-04-13 ENCOUNTER — Ambulatory Visit (INDEPENDENT_AMBULATORY_CARE_PROVIDER_SITE_OTHER): Payer: Medicare Other | Admitting: Family Medicine

## 2017-04-13 VITALS — BP 122/78 | HR 82 | Temp 98.2°F | Ht 64.0 in | Wt 150.0 lb

## 2017-04-13 DIAGNOSIS — E039 Hypothyroidism, unspecified: Secondary | ICD-10-CM

## 2017-04-13 DIAGNOSIS — Z7689 Persons encountering health services in other specified circumstances: Secondary | ICD-10-CM | POA: Diagnosis not present

## 2017-04-13 DIAGNOSIS — I10 Essential (primary) hypertension: Secondary | ICD-10-CM

## 2017-04-13 DIAGNOSIS — M7061 Trochanteric bursitis, right hip: Secondary | ICD-10-CM | POA: Diagnosis not present

## 2017-04-13 DIAGNOSIS — E785 Hyperlipidemia, unspecified: Secondary | ICD-10-CM

## 2017-04-13 NOTE — Progress Notes (Signed)
Patient ID: [name], female   DOB: 1941-02-01, 76 y.o.   MRN: [id number]  Patient presents to clinic today to establish care.  Chronic Issues:  Patient is here to monitor thyroid status There has been no change in the dose, brand, or mode of administration of thyroid supplement Last TSH:  Lab Results  Component Value Date   TSH 0.52 05/17/2016    Associated fatigue has been present for "many months" but does not describe this as significant Pertinent negative or absent signs and symptoms are as follows: Constitutional: No significant change in weight; sleep disorder; change in appetite. Eye: no blurred, double ,loss of vision Cardiovascular: no palpitations; racing; irregularity ENT/GI: no constipation; diarrhea;hoarseness;dysphagia Derm: no change in nails,hair,skin Neuro: no numbness or tingling; tremor Psych:no anxiety; depression; panic attacks Endo: no temperature intolerance to heat ,cold   Trochanteric bursitis of right hip:  Patient reports right hip pain that is present when she rolls over on her side at night. She states that this disturbs her sleep and may be contributing to her fatigue. Treatment with ASA provides benefit; however she reports taking this on a "limited basis" She reports being evaluated by her neurosurgeon for this concern which did not indicate that hip pain was originating from back.  She denies fever, chills, weight loss, and rash. No hip pain is noted at this visit. She denies radiating pain.  Hypertension  Patient is currently maintained on the following medications for blood pressure: amlodipine  Patient reports good compliance with blood pressure medications. Patient denies chest pain, shortness of breath or swelling. Last 3 blood pressure readings in our office are as follows: BP Readings from Last 3 Encounters:  04/13/17 122/78  05/17/16 140/80  05/03/16 118/72   Hyperlipidemia: Patient reports adherence with atorvastatin if using  approximately 3 time/week and denies adverse effects and myalgias with medication. She is followed by cardiology and has an appointment scheduled in one month.  History of CAD and non-ST MI in 2009.  She is followed by cardiology and underwent cardiac catheterization in 2017 for anginal symptoms that demonstrated moderate diffuse mid LAD stenosis. She was treated medically and reports that chest tightness was much better. She is scheduled for cardiology follow up in one month.   Lab Results  Component Value Date   CHOL 155 10/06/2015   HDL 45 (L) 10/06/2015   LDLCALC 85 10/06/2015   TRIG 124 10/06/2015   CHOLHDL 3.4 10/06/2015   She follows a modified heart healthy diet; exercise on a treadmill with walking approximately 30 minutes 3 to 4 times/week without cardiopulmonary symptoms.    Health Maintenance: Dental --Once yearly and denies problems. Vision --Once yearly Immunizations --UTD; can consider ShingRx  Colonoscopy -- 2007 normal; she is no longer screening due to age Mammogram -- 2012; She reports that she does not wish to have further screening completed. PAP -- Hysterectomy; No Pap indicated; last pelvic exam 05/2016 Bone Density -- 2015; Results from this scan showed mild progression of continued bone loss (osteoporosis): -2.8, prev -2.7 in 2013 and patient was recommended to reconsidering Boniva or Prolia for prevention of further bone loss and decreasing risk of fracture. She was also advised to continue weight bearing exercise with Calcium and Vitamin D supplementation.    Past Medical History:  Diagnosis Date  . Acute lumbar radiculopathy 04/2012   L L5-S1 s/p decompression surg  . Atopic eczema   . CAD (coronary artery disease)    unspecified site with NSTEMI 2009. Small  OM treated with PCA  . Essential hypertension, benign    white coat     dr cooper  . Hyperlipidemia    intol statin  . Hypothyroidism   . Myocardial infarction ([place])    2009  . Osteoporosis     hx tailbone fx; bisphos >58yr tx, stopped 2011    Past Surgical History:  Procedure Laterality Date  . APPENDECTOMY    . CARDIAC CATHETERIZATION     2009  . CARPAL TUNNEL RELEASE     bilateral, dorsal release  . HIP SURGERY     in the 1980s with a staph infection postoperatively  . LAPAROSCOPIC HYSTERECTOMY    . LEFT HEART CATHETERIZATION WITH CORONARY ANGIOGRAM N/A 11/18/2013   Procedure: LEFT HEART CATHETERIZATION WITH CORONARY ANGIOGRAM;  Surgeon: Blane Ohara, MD;  Location[facility] LAB;  Service: Cardiovascular;  Laterality: N/A;  . LUMBAR LAMINECTOMY/DECOMPRESSION MICRODISCECTOMY  04/17/2012   Procedure: LUMBAR LAMINECTOMY/DECOMPRESSION MICRODISCECTOMY 1 LEVEL;  Surgeon: Erline Levine, MD;  Location: [facility] NEURO ORS;  Service: Neurosurgery;  Laterality: Left;  Left Lumbar five-Sacral one Microdiskectomy  . TONSILLECTOMY      Current Outpatient Prescriptions on File Prior to Visit  Medication Sig Dispense Refill  . amLODipine (NORVASC) 5 MG tablet Take 1 tablet (5 mg total) by mouth daily. 90 tablet 3  . aspirin 81 MG tablet Take 81 mg by mouth daily.      Marland Kitchen atorvastatin (LIPITOR) 10 MG tablet Take 1 tablet (10 mg total) by mouth once a week. 15 tablet 3  . Cholecalciferol (VITAMIN D) 2000 UNITS CAPS Take 1 capsule (2,000 Units total) by mouth daily. 30 capsule   . clobetasol (TEMOVATE) 0.05 % external solution Apply 1 application topically daily as needed. to scalp. 50 mL 1  . Coenzyme Q10 (CO Q-10) 300 MG CAPS Take 2 mLs by mouth daily.     . Desoximetasone (TOPICORT) 0.25 % ointment Apply 1 application topically 2 (two) times daily as needed (itching.). Apply to hands.    Marland Kitchen EPINEPHrine (EPIPEN) 0.3 mg/0.3 mL SOAJ injection Inject 0.3 mLs (0.3 mg total) into the muscle once. 1 Device 1  . ezetimibe (ZETIA) 10 MG tablet Take 1 tablet (10 mg total) by mouth daily. 90 tablet 3  . levothyroxine (SYNTHROID) 88 MCG tablet Take 1 tablet (88 mcg total) by mouth daily before breakfast.  Annual appt due in Aug must see MD for future refills 90 tablet 0  . Multiple Vitamins-Minerals (EYE VITAMINS) TABS Take 1 tablet by mouth daily.      . nitroGLYCERIN (NITROSTAT) 0.4 MG SL tablet Place 1 tablet (0.4 mg total) under the tongue every 5 (five) minutes as needed. For chest pain 25 tablet 11   No current facility-administered medications on file prior to visit.     Allergies  Allergen Reactions  . Shellfish Allergy Hives and Shortness Of Breath  . Codeine   . Lanolin Itching  . Latex Other (See Comments)    Unknown allergy testing showed allergic  . Statins     Simvastatin, Lipitor (both caused muscle aches); Crestor 5 mg and 10 mg daily (memory impairment), Livalo (throat and mouth itching), pravastatin 10 mg qd (memory)  . Cephalexin Itching and Rash  . Ciprofloxacin Rash  . Erythromycin Itching and Rash  . Penicillins Itching and Rash  . Propylene Glycol Itching and Rash  . Tetracycline Itching and Rash  . Tetracyclines & Related Itching and Rash    Family History  Problem Relation Age of  Onset  . Colon cancer Mother 3       1st degree  . Stroke Father 71  . Stroke Unknown        Grandfather- grandmother    Social History   Social History  . Marital status: Married    Spouse name: N/A  . Number of children: 2  . Years of education: N/A   Occupational History  . retired    Social History Main Topics  . Smoking status: Never Smoker  . Smokeless tobacco: Never Used  . Alcohol use 0.0 oz/week     Comment: 1 glass of wine daily  . Drug use: No  . Sexual activity: Not on file   Other Topics Concern  . Not on file   Social History Narrative  . No narrative on file    Review of Systems  Constitutional: Positive for malaise/fatigue. Negative for chills, fever and weight loss.  HENT: Negative for congestion, nosebleeds and sore throat.   Eyes: Negative for blurred vision and double vision.  Respiratory: Negative for cough, sputum production and  shortness of breath.   Cardiovascular: Negative for chest pain, palpitations, orthopnea, claudication, leg swelling and PND.  Gastrointestinal: Negative for abdominal pain, diarrhea, heartburn, melena, nausea and vomiting.  Genitourinary: Negative for dysuria, flank pain, frequency and urgency.  Musculoskeletal: Negative for back pain.  Skin: Negative for rash.  Neurological: Negative for dizziness, tingling, tremors, sensory change and weakness.  Psychiatric/Behavioral:       Denies depressed or anxious mood today    BP 122/78 (BP Location: Left Arm, Patient Position: Sitting, Cuff Size: Normal)   Pulse 82   Temp 98.2 F (36.8 C) (Oral)   Ht 5\' 4"  (1.626 m)   Wt 150 lb (68 kg)   SpO2 97%   BMI 25.75 kg/m   Physical Exam  Constitutional: She is oriented to person, place, and time and well-developed, well-nourished, and in no distress.  HENT:  Right Ear: Tympanic membrane normal.  Left Ear: Tympanic membrane normal.  Nose: No rhinorrhea. Right sinus exhibits no maxillary sinus tenderness and no frontal sinus tenderness. Left sinus exhibits no maxillary sinus tenderness and no frontal sinus tenderness.  Mouth/Throat: Oropharynx is clear and moist and mucous membranes are normal.  Eyes: Pupils are equal, round, and reactive to light. No scleral icterus.  Neck: Neck supple.  Cardiovascular: Normal rate, regular rhythm and intact distal pulses.   Pulmonary/Chest: Effort normal and breath sounds normal. She has no wheezes. She has no rales.  Abdominal: Soft. Bowel sounds are normal. There is no tenderness. There is no rebound.  Musculoskeletal: She exhibits no edema.  Lymphadenopathy:    She has no cervical adenopathy.  Neurological: She is alert and oriented to person, place, and time. Gait normal.  Skin: Skin is warm and dry. No rash noted.  Psychiatric: Mood, memory, affect and judgment normal.     Assessment/Plan:  1. Hypothyroidism, unspecified type Last TSH 11 months ago;  mild fatigue noted; will check TSH and adjust dose if needed. - TSH  2. Hypertension, essential Controlled; no medication changes recommended - Basic metabolic panel - CBC with Differential/Platelet  3. Hyperlipidemia, unspecified hyperlipidemia type Advised heart healthy diet and exercise; follow up with cardiology next month as scheduled  4. Trochanteric bursitis of right hip Advised stretching and can consider PT for discomfort as pain is causing sleep disturbance. Patient will consider this option and follow up for CPE and notify us if she would like this referral.  5. Encounter to establish care We reviewed the PMH, PSH, FH, SH, Meds and Allergies. -We provided refills for any medications we will prescribe as needed. -We addressed current concerns per orders and patient instructions. -We have asked for records for pertinent exams, studies, vaccines and notes from previous providers. -We have advised patient to follow up per instructions below.   -Patient advised to return or notify a provider immediately if symptoms worsen or persist or new concerns arise.  Follow up for CPE and preventive screenings such as bone density as recommended. She will also consider ShingRx prior to CPE.   Delano Metz, FNP-C

## 2017-04-13 NOTE — Patient Instructions (Signed)
It was a pleasure to see you today.   We have ordered labs or studies at this visit. It can take up to 1-2 weeks for results and processing. IF results require follow up or explanation, we will call you with instructions. Clinically stable results will be released to your [facility]. If you have not heard from [place] or cannot find your results in [facility] in 2 weeks please contact our office at [phone].  If you are not yet signed up for [facility], please consider signing up    Lab results will determine follow up and timing of physical.   Fatigue Fatigue is feeling tired all of the time, a lack of energy, or a lack of motivation. Occasional or mild fatigue is often a normal response to activity or life in general. However, long-lasting (chronic) or extreme fatigue may indicate an underlying medical condition. Follow these instructions at home: Watch your fatigue for any changes. The following actions may help to lessen any discomfort you are feeling:  Talk to your health care provider about how much sleep you need each night. Try to get the required amount every night.  Take medicines only as directed by your health care provider.  Eat a healthy and nutritious diet. Ask your health care provider if you need help changing your diet.  Drink enough fluid to keep your urine clear or pale yellow.  Practice ways of relaxing, such as yoga, meditation, massage therapy, or acupuncture.  Exercise regularly.  Change situations that cause you stress. Try to keep your work and personal routine reasonable.  Do not abuse illegal drugs.  Limit alcohol intake to no more than 1 drink per day for nonpregnant women and 2 drinks per day for men. One drink equals 12 ounces of beer, 5 ounces of wine, or 1 ounces of hard liquor.  Take a multivitamin, if directed by your health care provider.  Contact a health care provider if:  Your fatigue does not get better.  You have a fever.  You have  unintentional weight loss or gain.  You have headaches.  You have difficulty: ? Falling asleep. ? Sleeping throughout the night.  You feel angry, guilty, anxious, or sad.  You are unable to have a bowel movement (constipation).  You skin is dry.  Your legs or another part of your body is swollen. Get help right away if:  You feel confused.  Your vision is blurry.  You feel faint or pass out.  You have a severe headache.  You have severe abdominal, pelvic, or back pain.  You have chest pain, shortness of breath, or an irregular or fast heartbeat.  You are unable to urinate or you urinate less than normal.  You develop abnormal bleeding, such as bleeding from the rectum, vagina, nose, lungs, or nipples.  You vomit blood.  You have thoughts about harming yourself or committing suicide.  You are worried that you might harm someone else. This information is not intended to replace advice given to you by your health care provider. Make sure you discuss any questions you have with your health care provider. Document Released: 07/17/2007 Document Revised: 02/25/2016 Document Reviewed: 01/21/2014 Elsevier Interactive Patient Education  [employer].

## 2017-04-14 ENCOUNTER — Other Ambulatory Visit: Payer: Self-pay | Admitting: Family Medicine

## 2017-04-14 ENCOUNTER — Other Ambulatory Visit: Payer: Self-pay | Admitting: *Deleted

## 2017-04-14 DIAGNOSIS — E039 Hypothyroidism, unspecified: Secondary | ICD-10-CM

## 2017-04-14 LAB — BASIC METABOLIC PANEL
BUN: 22 mg/dL (ref 6–23)
CHLORIDE: 104 meq/L (ref 96–112)
CO2: 25 mEq/L (ref 19–32)
Calcium: 10.9 mg/dL — ABNORMAL HIGH (ref 8.4–10.5)
Creatinine, Ser: 0.8 mg/dL (ref 0.40–1.20)
GFR: 74.12 mL/min (ref >60.00)
Glucose, Bld: 87 mg/dL (ref 70–99)
POTASSIUM: 4.3 meq/L (ref 3.5–5.1)
Sodium: 138 mEq/L (ref 135–145)

## 2017-04-14 LAB — CBC WITH DIFFERENTIAL/PLATELET
BASOS PCT: 0.6 % (ref 0.0–3.0)
Basophils Absolute: 0 10*3/uL (ref 0.0–0.1)
EOS ABS: 0.4 10*3/uL (ref 0.0–0.7)
EOS PCT: 5.2 % — AB (ref 0.0–5.0)
HEMATOCRIT: 38.7 % (ref 36.0–46.0)
HEMOGLOBIN: 13.2 g/dL (ref 12.0–15.0)
LYMPHS PCT: 41.8 % (ref 12.0–46.0)
Lymphs Abs: 3.1 10*3/uL (ref 0.7–4.0)
MCHC: 34 g/dL (ref 30.0–36.0)
MCV: 92.5 fl (ref 78.0–100.0)
MONO ABS: 0.5 10*3/uL (ref 0.1–1.0)
Monocytes Relative: 6.6 % (ref 3.0–12.0)
Neutro Abs: 3.4 10*3/uL (ref 1.4–7.7)
Neutrophils Relative %: 45.8 % (ref 43.0–77.0)
Platelets: 249 10*3/uL (ref 150.0–400.0)
RBC: 4.18 Mil/uL (ref 3.87–5.11)
RDW: 13.4 % (ref 11.5–15.5)
WBC: 7.5 10*3/uL (ref 4.0–10.5)

## 2017-04-14 LAB — TSH: TSH: 0.14 u[IU]/mL — AB (ref 0.35–4.50)

## 2017-04-14 MED ORDER — LEVOTHYROXINE SODIUM 75 MCG PO TABS: 75.0000 ug | ORAL_TABLET | Freq: Every day | ORAL | 3 refills | Status: DC

## 2017-04-14 NOTE — Progress Notes (Signed)
Patient advised of results and new rx sent to [facility]; patient voiced understanding.

## 2017-04-14 NOTE — Progress Notes (Unsigned)
Medication for thyroid needs to be reduced after assessment of TSH level.  Levothyroxine 75 mcg daily instead of 88 mcg daily and advise follow up in 6 to 8 weeks to recheck lab work.

## 2017-04-25 ENCOUNTER — Encounter: Payer: Self-pay | Admitting: *Deleted

## 2017-05-11 ENCOUNTER — Ambulatory Visit (INDEPENDENT_AMBULATORY_CARE_PROVIDER_SITE_OTHER): Payer: Medicare Other | Admitting: Cardiovascular Disease

## 2017-05-11 ENCOUNTER — Encounter: Payer: Self-pay | Admitting: Cardiovascular Disease

## 2017-05-11 VITALS — BP 140/72 | HR 83 | Ht 63.5 in | Wt 148.0 lb

## 2017-05-11 DIAGNOSIS — E782 Mixed hyperlipidemia: Secondary | ICD-10-CM

## 2017-05-11 DIAGNOSIS — I25119 Atherosclerotic heart disease of native coronary artery with unspecified angina pectoris: Secondary | ICD-10-CM

## 2017-05-11 DIAGNOSIS — I252 Old myocardial infarction: Secondary | ICD-10-CM

## 2017-05-11 DIAGNOSIS — I10 Essential (primary) hypertension: Secondary | ICD-10-CM

## 2017-05-11 DIAGNOSIS — I25118 Atherosclerotic heart disease of native coronary artery with other forms of angina pectoris: Secondary | ICD-10-CM | POA: Diagnosis not present

## 2017-05-11 MED ORDER — ATORVASTATIN CALCIUM 10 MG PO TABS: 10.0000 mg | ORAL_TABLET | ORAL | 3 refills | Status: DC

## 2017-05-11 MED ORDER — EZETIMIBE 10 MG PO TABS: 10.0000 mg | ORAL_TABLET | Freq: Every day | ORAL | 3 refills | Status: DC

## 2017-05-11 MED ORDER — AMLODIPINE BESYLATE 5 MG PO TABS: 5.0000 mg | ORAL_TABLET | Freq: Every day | ORAL | 3 refills | Status: DC

## 2017-05-11 MED ORDER — NITROGLYCERIN 0.4 MG SL SUBL: 0.4000 mg | SUBLINGUAL_TABLET | SUBLINGUAL | 11 refills | Status: DC | PRN

## 2017-05-11 NOTE — Patient Instructions (Signed)
Medication Instructions:  Your physician recommends that you continue on your current medications as directed. Please refer to the Current Medication list given to you today.   Labwork: TODAY - cholesterol, liver panel, basic metabolic panel   Testing/Procedures: Your physician has requested that you have a stress echocardiogram. For further information please visit [email or web address]. Please follow instruction sheet as given.   Follow-Up: Your physician wants you to follow-up in: 1 year with Dr. Burt Knack.  You will receive a reminder letter in the mail two months in advance. If you don't receive a letter, please call our office to schedule the follow-up appointment.   If you need a refill on your cardiac medications before your next appointment, please call your pharmacy.   Thank you for choosing [facility]! Christen Bame, RN [phone]

## 2017-05-11 NOTE — Progress Notes (Signed)
Cardiology Office Note Date:  05/11/2017   ID:  Victoria [name], DOB 07-Jul-1941, MRN [id number]  PCP:  Delano Metz, FNP  Cardiologist:  [name], MD    Chief Complaint  Patient presents with  . Follow-up    1 year     History of Present Illness: Victoria [name] is a 76 y.o. female who presents for follow-up of coronary artery disease.  The patient had a non-ST MI in 2009 and was noted to have small vessel disease. She did have balloon angioplasty of a small circumflex branch.  She underwent cardiac catheterization in 2016 for progressive anginal symptoms. This demonstrated moderate diffuse mid LAD stenosis. FFR analysis was borderline. She was treated medically.  The patient has chronic intermittent chest pain. When she was seen last year by Victoria [name] she was doing really well with marked reduction in her chest pain symptoms with a good activity level.   The patient has intermittent chest pain at night or at other times. There is no relation to physical activity. She does complain of shortness of breath with activity. No dyspnea at rest. No orthopnea or PND. No palpitations. She's not doing much activity and has been sedentary. When she walks she complains of hurting in her legs in the evening so she has quit for exercise. She takes aspirin when she has chest pain and this helps resolve the symptoms. She does not take nitroglycerin much because of a headache. She has taken nitroglycerin 2 or 3 times in the last year.  Past Medical History:  Diagnosis Date  . Acute lumbar radiculopathy 04/2012   L L5-S1 s/p decompression surg  . Atopic eczema   . CAD (coronary artery disease)    unspecified site with NSTEMI 2009. Small OM treated with PCA  . Essential hypertension, benign    white coat     dr Monchel Pollitt  . Hyperlipidemia    intol statin  . Hypothyroidism   . Myocardial infarction ([place])    2009  . Osteoporosis    hx tailbone fx; bisphos >68yr tx, stopped 2011     Past Surgical History:  Procedure Laterality Date  . APPENDECTOMY    . CARDIAC CATHETERIZATION     2009  . CARPAL TUNNEL RELEASE     bilateral, dorsal release  . HIP SURGERY     in the 1980s with a staph infection postoperatively  . LAPAROSCOPIC HYSTERECTOMY    . LEFT HEART CATHETERIZATION WITH CORONARY ANGIOGRAM N/A 11/18/2013   Procedure: LEFT HEART CATHETERIZATION WITH CORONARY ANGIOGRAM;  Surgeon: Blane Ohara, MD;  Location: [facility] CATH LAB;  Service: Cardiovascular;  Laterality: N/A;  . LUMBAR LAMINECTOMY/DECOMPRESSION MICRODISCECTOMY  04/17/2012   Procedure: LUMBAR LAMINECTOMY/DECOMPRESSION MICRODISCECTOMY 1 LEVEL;  Surgeon: Erline Levine, MD;  Location: [place] NEURO ORS;  Service: Neurosurgery;  Laterality: Left;  Left Lumbar five-Sacral one Microdiskectomy  . TONSILLECTOMY      Current Outpatient Prescriptions  Medication Sig Dispense Refill  . aspirin 81 MG tablet Take 81 mg by mouth daily.      . Cholecalciferol (VITAMIN D) 2000 UNITS CAPS Take 1 capsule (2,000 Units total) by mouth daily. 30 capsule   . clobetasol (TEMOVATE) 0.05 % external solution Apply 1 application topically daily as needed. to scalp. 50 mL 1  . Coenzyme Q10 (CO Q-10) 300 MG CAPS Take 300 mg by mouth daily.     . Desoximetasone (TOPICORT) 0.25 % ointment Apply 1 application topically 2 (two) times daily as needed (itching.). Apply to  hands.    Marland Kitchen EPINEPHrine (EPIPEN) 0.3 mg/0.3 mL SOAJ injection Inject 0.3 mLs (0.3 mg total) into the muscle once. 1 Device 1  . levothyroxine (SYNTHROID, LEVOTHROID) 75 MCG tablet Take 1 tablet (75 mcg total) by mouth daily. 90 tablet 3  . Multiple Vitamins-Minerals (EYE VITAMINS) TABS Take 1 tablet by mouth daily.      . nitroGLYCERIN (NITROSTAT) 0.4 MG SL tablet Place 1 tablet (0.4 mg total) under the tongue every 5 (five) minutes as needed. For chest pain 25 tablet 11  . amLODipine (NORVASC) 5 MG tablet Take 1 tablet (5 mg total) by mouth daily. 90 tablet 3  . atorvastatin  (LIPITOR) 10 MG tablet Take 1 tablet (10 mg total) by mouth once a week. 15 tablet 3  . ezetimibe (ZETIA) 10 MG tablet Take 1 tablet (10 mg total) by mouth daily. 90 tablet 3   No current facility-administered medications for this visit.     Allergies:   Shellfish allergy; Codeine; Lanolin; Latex; Statins; Cephalexin; Ciprofloxacin; Erythromycin; Penicillins; Propylene glycol; Tetracycline; and Tetracyclines & related   Social History:  The patient  reports that she has never smoked. She has never used smokeless tobacco. She reports that she drinks about 0.6 oz of alcohol per week . She reports that she does not use drugs.   Family History:  The patient's family history includes Colon cancer (age of onset: 63) in her mother; Stroke in her unknown relative; Stroke (age of onset: 38) in her father.    ROS:  Please see the history of present illness.  Otherwise, review of systems is positive for fatigue.  All other systems are reviewed and negative.    PHYSICAL EXAM: VS:  BP 140/72   Pulse 83   Ht 5' 3.5" (1.613 m)   Wt 148 lb (67.1 kg)   BMI 25.81 kg/m  , BMI Body mass index is 25.81 kg/m. GEN: Well nourished, well developed, in no acute distress  HEENT: normal  Neck: no JVD, no masses. No carotid bruits Cardiac: RRR without murmur or gallop                Respiratory:  clear to auscultation bilaterally, normal work of breathing GI: soft, nontender, nondistended, + BS MS: no deformity or atrophy  Ext: no pretibial edema, pedal pulses 2+= bilaterally Skin: warm and dry, no rash Neuro:  Strength and sensation are intact Psych: euthymic mood, full affect  EKG:  EKG is ordered today. The ekg ordered today shows NSR 83 bpm, incomplete RBBB, LAFB  Recent Labs: 05/17/2016: ALT 22 04/13/2017: BUN 22; Creatinine, Ser 0.80; Hemoglobin 13.2; Platelets 249.0; Potassium 4.3; Sodium 138; TSH 0.14   Lipid Panel     Component Value Date/Time   CHOL 155 10/06/2015 0753   TRIG 124 10/06/2015  0753   HDL 45 (L) 10/06/2015 0753   CHOLHDL 3.4 10/06/2015 0753   VLDL 25 10/06/2015 0753   LDLCALC 85 10/06/2015 0753      Wt Readings from Last 3 Encounters:  05/11/17 148 lb (67.1 kg)  04/13/17 150 lb (68 kg)  05/17/16 151 lb (68.5 kg)     ASSESSMENT AND PLAN: 1.  CAD, native vessel, with angina: The patient has intermittent angina not typically associated with physical exertion. However she does have progressive dyspnea. With her known coronary artery disease I have recommended an exercise stress echo to evaluate her ischemic burden and exercise capacity. Current medicines will be continued. She has had a lot of medicine intolerances  in the past and I don't think she'll tolerate an aggressive antianginal program. We have discussed all this at length in the past and she has typically declined increased medications. Her antianginal program includes amlodipine 5 mg daily.  2. Hypercholesterolemia: Last lipids reviewed and due for updated labs. Will schedule lipids and LFTs. Continue atorvastatin which she only takes about once a week. Continue zetia.  Current medicines are reviewed with the patient today.  The patient does not have concerns regarding medicines.  Labs/ tests ordered today include:   Orders Placed This Encounter  Procedures  . Lipid Profile  . Hepatic function panel  . Basic Metabolic Panel (BMET)  . ECHOCARDIOGRAM STRESS TEST    Disposition:   FU one year  Signed, [name], MD  05/11/2017 10:59 PM    [facility] [place], [place], [place]  [address] Phone: [phone]; Fax: [phone]

## 2017-05-15 ENCOUNTER — Other Ambulatory Visit: Payer: Self-pay

## 2017-05-15 DIAGNOSIS — I25119 Atherosclerotic heart disease of native coronary artery with unspecified angina pectoris: Secondary | ICD-10-CM

## 2017-05-22 ENCOUNTER — Ambulatory Visit: Payer: Medicare Other | Admitting: Family Medicine

## 2017-05-29 ENCOUNTER — Ambulatory Visit: Payer: Medicare Other | Admitting: Family Medicine

## 2017-06-01 ENCOUNTER — Telehealth (HOSPITAL_COMMUNITY): Payer: Self-pay | Admitting: *Deleted

## 2017-06-01 NOTE — Telephone Encounter (Signed)
Patient given detailed instructions per Stress Test Requisition Sheet for test on 06/08/17 at 7:30.Patient Notified to arrive 30 minutes early, and that it is imperative to arrive on time for appointment to keep from having the test rescheduled.  Patient verbalized understanding. Victoria [name]

## 2017-06-02 NOTE — Telephone Encounter (Signed)
Will forward to Dr. Burt Knack so he his aware.

## 2017-06-02 NOTE — Telephone Encounter (Signed)
New message     FYI Calling to let Dr Burt Knack know that she cancelled her stress echo.  She did not want to have it at this time.

## 2017-06-05 NOTE — Telephone Encounter (Signed)
Ok thanks 

## 2017-06-08 ENCOUNTER — Other Ambulatory Visit: Payer: Medicare Other | Admitting: *Deleted

## 2017-06-08 ENCOUNTER — Other Ambulatory Visit (HOSPITAL_COMMUNITY): Payer: Medicare Other

## 2017-06-08 DIAGNOSIS — I25119 Atherosclerotic heart disease of native coronary artery with unspecified angina pectoris: Secondary | ICD-10-CM

## 2017-06-08 DIAGNOSIS — E782 Mixed hyperlipidemia: Secondary | ICD-10-CM

## 2017-06-08 DIAGNOSIS — I10 Essential (primary) hypertension: Secondary | ICD-10-CM

## 2017-06-08 LAB — LIPID PANEL
CHOL/HDL RATIO: 3.3 ratio (ref 0.0–4.4)
Cholesterol, Total: 152 mg/dL (ref 100–199)
HDL: 46 mg/dL (ref >39)
LDL CALC: 89 mg/dL (ref 0–99)
TRIGLYCERIDES: 86 mg/dL (ref 0–149)
VLDL Cholesterol Cal: 17 mg/dL (ref 5–40)

## 2017-06-08 LAB — BASIC METABOLIC PANEL
BUN/Creatinine Ratio: 16 (ref 12–28)
BUN: 12 mg/dL (ref 8–27)
CALCIUM: 10.3 mg/dL (ref 8.7–10.3)
CHLORIDE: 106 mmol/L (ref 96–106)
CO2: 23 mmol/L (ref 20–29)
CREATININE: 0.76 mg/dL (ref 0.57–1.00)
GFR calc Af Amer: 88 mL/min/{1.73_m2} (ref >59)
GFR calc non Af Amer: 76 mL/min/{1.73_m2} (ref >59)
GLUCOSE: 81 mg/dL (ref 65–99)
Potassium: 4.5 mmol/L (ref 3.5–5.2)
Sodium: 141 mmol/L (ref 134–144)

## 2017-06-08 LAB — HEPATIC FUNCTION PANEL
ALT: 19 IU/L (ref 0–32)
AST: 22 IU/L (ref 0–40)
Albumin: 4.3 g/dL (ref 3.5–4.8)
Alkaline Phosphatase: 106 IU/L (ref 39–117)
BILIRUBIN, DIRECT: 0.1 mg/dL (ref 0.00–0.40)
Bilirubin Total: 0.4 mg/dL (ref 0.0–1.2)
TOTAL PROTEIN: 7.5 g/dL (ref 6.0–8.5)

## 2017-06-09 ENCOUNTER — Telehealth (HOSPITAL_COMMUNITY): Payer: Self-pay | Admitting: Cardiovascular Disease

## 2017-06-09 NOTE — Telephone Encounter (Signed)
Patient cancelled appt on 06/08/17 Patient (cancelled by pt. do not want to have test at this time)  She will be removed from the workqueue.

## 2017-07-05 ENCOUNTER — Encounter: Payer: Self-pay | Admitting: Family Medicine

## 2017-07-05 ENCOUNTER — Ambulatory Visit (INDEPENDENT_AMBULATORY_CARE_PROVIDER_SITE_OTHER): Payer: Medicare Other | Admitting: Family Medicine

## 2017-07-05 VITALS — BP 126/74 | HR 88 | Temp 98.2°F | Ht 63.5 in | Wt 148.2 lb

## 2017-07-05 DIAGNOSIS — M810 Age-related osteoporosis without current pathological fracture: Secondary | ICD-10-CM

## 2017-07-05 DIAGNOSIS — E039 Hypothyroidism, unspecified: Secondary | ICD-10-CM

## 2017-07-05 DIAGNOSIS — E782 Mixed hyperlipidemia: Secondary | ICD-10-CM | POA: Diagnosis not present

## 2017-07-05 DIAGNOSIS — I10 Essential (primary) hypertension: Secondary | ICD-10-CM

## 2017-07-05 DIAGNOSIS — H9193 Unspecified hearing loss, bilateral: Secondary | ICD-10-CM | POA: Diagnosis not present

## 2017-07-05 LAB — T4, FREE: Free T4: 1.09 ng/dL (ref 0.60–1.60)

## 2017-07-05 LAB — TSH: TSH: 0.88 u[IU]/mL (ref 0.35–4.50)

## 2017-07-05 NOTE — Progress Notes (Signed)
Victoria [name] is a 76 y.o. female is here to [place].   Patient Care Team: Briscoe Deutscher, DO as PCP - General (Family Medicine) [name], MD (Cardiology) Erline Levine, MD (Neurosurgery) Ronald Lobo, MD (Gastroenterology)   History of Present Illness:   Gertie Exon, CMA, acting as scribe for Dr. Juleen China.  HPI:   1. Hypothyroidism.   Lab Results  Component Value Date   TSH 0.88 07/05/2017   Review of Systems  Constitutional: Negative for malaise/fatigue and weight loss.  Eyes: Negative for blurred vision.  Cardiovascular: Negative for chest pain, palpitations and leg swelling.  Neurological: Negative for dizziness, tingling, speech change, focal weakness and headaches.    2. Osteoporosis without current pathological fracture. Due for DEXA.    3. Essential hypertension.   Avoiding excessive salt intake? [x]   YES  []   NO Trying to exercise on a regular basis? [x]   YES  []   NO Review: taking medications as instructed, no medication side effects noted, no TIAs, no chest pain on exertion, no dyspnea on exertion, no swelling of ankles.   Wt Readings from Last 3 Encounters:  07/05/17 148 lb 3.2 oz (67.2 kg)  05/11/17 148 lb (67.1 kg)  04/13/17 150 lb (68 kg)   Reports that she has never smoked. She has never used smokeless tobacco.  BP Readings from Last 3 Encounters:  07/05/17 126/74  05/11/17 140/72  04/13/17 122/78   Lab Results  Component Value Date   CREATININE 0.76 06/08/2017    4. Mixed hyperlipidemia.   Is the patient taking medications without problems? [x]   YES  []   NO Does the patient complain of muscle aches?   []   YES  [x]    NO Trying to exercise on a regular basis? [x]   YES  []   NO Cardiovascular ROS: no chest pain or dyspnea on exertion.   Lipids:    Component Value Date/Time   CHOL 152 06/08/2017 0737   TRIG 86 06/08/2017 0737   HDL 46 06/08/2017 0737   VLDL 25 10/06/2015 0753   CHOLHDL 3.3 06/08/2017 0737   CHOLHDL  3.4 10/06/2015 0753    5. Bilateral hearing loss. Would like screening today.   There are no preventive care reminders to display for this patient.   Depression screen PHQ 2/9 07/05/2017  Decreased Interest 0  Down, Depressed, Hopeless 0  PHQ - 2 Score 0   PMHx, SurgHx, SocialHx, Medications, and Allergies were reviewed in the Visit Navigator and updated as appropriate.   Past Medical History:  Diagnosis Date  . Acute lumbar radiculopathy 04/2012  . Atopic eczema   . CAD (coronary artery disease)   . Essential hypertension, benign   . Hyperlipidemia, statin intolerant   . Hypothyroidism   . Myocardial infarction ([place]) 2009  . NSTEMI (non-ST elevated myocardial infarction) ([place]) 2009  . Osteoporosis, s/p bisphophonates x 7 years, stopped in 2011   . Sacrum and coccyx fracture [facility])    Past Surgical History:  Procedure Laterality Date  . APPENDECTOMY    . CARDIAC CATHETERIZATION  2009  . CARPAL TUNNEL RELEASE Bilateral   . HIP SURGERY  1980s  . LAPAROSCOPIC HYSTERECTOMY    . LEFT HEART CATHETERIZATION WITH CORONARY ANGIOGRAM N/A 11/18/2013   Procedure: LEFT HEART CATHETERIZATION WITH CORONARY ANGIOGRAM;  Surgeon: Blane Ohara, MD;  Location: [facility] CATH LAB;  Service: Cardiovascular;  Laterality: N/A;  . LUMBAR LAMINECTOMY/DECOMPRESSION MICRODISCECTOMY  04/17/2012   Procedure: LUMBAR LAMINECTOMY/DECOMPRESSION MICRODISCECTOMY 1 LEVEL;  Surgeon: Broadus John  Vertell Limber, MD;  Location: [place] NEURO ORS;  Service: Neurosurgery;  Laterality: Left;  Left Lumbar five-Sacral one Microdiskectomy  . TONSILLECTOMY     Family History  Problem Relation Age of Onset  . Colon cancer Mother 70       1st degree  . Stroke Father 65  . Stroke Unknown        Grandfather- grandmother   Social History  Substance Use Topics  . Smoking status: Never Smoker  . Smokeless tobacco: Never Used  . Alcohol use 0.6 oz/week    1 Glasses of wine per week   Current Medications and Allergies:   .  amLODipine  (NORVASC) 5 MG tablet, Take 1 tablet (5 mg total) by mouth daily., Disp: 90 tablet, Rfl: 3 .  aspirin 81 MG tablet, Take 81 mg by mouth daily.  , Disp: , Rfl:  .  atorvastatin (LIPITOR) 10 MG tablet, Take 1 tablet (10 mg total) by mouth once a week., Disp: 15 tablet, Rfl: 3 .  Cholecalciferol (VITAMIN D) 2000 UNITS CAPS, Take 1 capsule (2,000 Units total) by mouth daily., Disp: 30 capsule, Rfl:  .  clobetasol (TEMOVATE) 0.05 % external solution, Apply 1 application topically daily as needed. to scalp., Disp: 50 mL, Rfl: 1 .  Coenzyme Q10 (CO Q-10) 300 MG CAPS, Take 300 mg by mouth daily. , Disp: , Rfl:  .  Desoximetasone (TOPICORT) 0.25 % ointment, Apply 1 application topically 2 (two) times daily as needed (itching.). Apply to hands., Disp: , Rfl:  .  ezetimibe (ZETIA) 10 MG tablet, Take 1 tablet (10 mg total) by mouth daily., Disp: 90 tablet, Rfl: 3 .  levothyroxine (SYNTHROID, LEVOTHROID) 75 MCG tablet, Take 1 tablet (75 mcg total) by mouth daily., Disp: 90 tablet, Rfl: 3 .  Multiple Vitamins-Minerals (EYE VITAMINS) TABS, Take 1 tablet by mouth daily.  , Disp: , Rfl:  .  nitroGLYCERIN (NITROSTAT) 0.4 MG SL tablet, Place 1 tablet (0.4 mg total) under the tongue every 5 (five) minutes as needed. For chest pain, Disp: 25 tablet, Rfl: 11  Allergies  Allergen Reactions  . Shellfish Allergy Hives and Shortness Of Breath  . Codeine     headache  . Lanolin Itching  . Latex Other (See Comments)    Unknown allergy testing showed allergic  . Statins     Simvastatin, Lipitor (both caused muscle aches); Crestor 5 mg and 10 mg daily (memory impairment), Livalo (throat and mouth itching), pravastatin 10 mg qd (memory)  . Cephalexin Itching and Rash  . Ciprofloxacin Rash  . Erythromycin Itching and Rash  . Penicillins Itching and Rash  . Propylene Glycol Itching and Rash  . Tetracycline Itching and Rash  . Tetracyclines & Related Itching and Rash   Review of Systems:   Pertinent items are noted in  the HPI. Otherwise, ROS is negative.  Vitals:   Vitals:   07/05/17 1007  BP: 126/74  Pulse: 88  Temp: 98.2 F (36.8 C)  TempSrc: Oral  SpO2: 96%  Weight: 148 lb 3.2 oz (67.2 kg)  Height: 5' 3.5" (1.613 m)     Body mass index is 25.84 kg/m.   Physical Exam:   Physical Exam  Constitutional: She is oriented to person, place, and time. She appears well-developed and well-nourished. No distress.  HENT:  Head: Normocephalic and atraumatic.  Right Ear: External ear normal.  Left Ear: External ear normal.  Nose: Nose normal.  Mouth/Throat: Oropharynx is clear and moist.  Eyes: Pupils are equal, round,  and reactive to light. Conjunctivae and EOM are normal.  Neck: Normal range of motion. Neck supple. No thyromegaly present.  Cardiovascular: Normal rate, regular rhythm, normal heart sounds and intact distal pulses.   Pulmonary/Chest: Effort normal and breath sounds normal.  Abdominal: Soft. Bowel sounds are normal.  Musculoskeletal: Normal range of motion.  Lymphadenopathy:    She has no cervical adenopathy.  Neurological: She is alert and oriented to person, place, and time.  Skin: Skin is warm and dry. Capillary refill takes less than 2 seconds.  Psychiatric: She has a normal mood and affect. Her behavior is normal.  Nursing note and vitals reviewed.  Results for orders placed or performed in visit on 07/05/17  T4, free  Result Value Ref Range   Free T4 1.09 0.60 - 1.60 ng/dL  TSH  Result Value Ref Range   TSH 0.88 0.35 - 4.50 uIU/mL   Assessment and Plan:   [place] was seen today for establish care.  Diagnoses and all orders for this visit:  Hypothyroidism, unspecified type -     T4, free -     TSH  Osteoporosis without current pathological fracture, unspecified osteoporosis type -     DG Bone Density; Future  Essential hypertension, benign Comments: Continue current management.   Mixed hyperlipidemia Comments: Continue current management.  Bilateral  hearing loss, unspecified hearing loss type -     Ambulatory referral to Audiology   . Reviewed expectations re: course of current medical issues. . Discussed self-management of symptoms. . Outlined signs and symptoms indicating need for more acute intervention. . Patient verbalized understanding and all questions were answered. Marland Kitchen Health Maintenance issues including appropriate healthy diet, exercise, and smoking avoidance were discussed with patient. . See orders for this visit as documented in the electronic medical record. . Patient received an After Visit Summary.  CMA served as Education administrator during this visit. History, Physical, and Plan performed by medical provider. The above documentation has been reviewed and is accurate and complete. Briscoe Deutscher, D.O.  Briscoe Deutscher, DO [place], [place] 07/06/2017

## 2017-07-05 NOTE — Patient Instructions (Signed)
Please ask the pharmacist of your choice about the Shingrix vaccine.

## 2017-07-06 ENCOUNTER — Encounter: Payer: Self-pay | Admitting: Family Medicine

## 2017-07-07 ENCOUNTER — Telehealth: Payer: Self-pay | Admitting: Family Medicine

## 2017-07-07 NOTE — Telephone Encounter (Signed)
Pt declined AWV. °

## 2017-07-11 ENCOUNTER — Encounter: Payer: Self-pay | Admitting: Family Medicine

## 2017-07-12 NOTE — Telephone Encounter (Signed)
Audiology will reach out and contact patient. It is just a slower process. For the bone density, patient can call any of the locations it is done at, such as [facility] or [facility].

## 2017-07-14 ENCOUNTER — Encounter: Payer: Medicare Other | Admitting: Internal Medicine

## 2017-07-20 ENCOUNTER — Ambulatory Visit (INDEPENDENT_AMBULATORY_CARE_PROVIDER_SITE_OTHER): Payer: Medicare Other | Admitting: Family Medicine

## 2017-07-20 VITALS — BP 140/80 | HR 87 | Ht 63.5 in | Wt 148.8 lb

## 2017-07-20 DIAGNOSIS — H5789 Other specified disorders of eye and adnexa: Secondary | ICD-10-CM

## 2017-07-20 DIAGNOSIS — L03213 Periorbital cellulitis: Secondary | ICD-10-CM

## 2017-07-20 MED ORDER — BACITRACIN-POLYMYXIN B 500-10000 UNIT/GM OP OINT: 1.0000 "application " | TOPICAL_OINTMENT | Freq: Two times a day (BID) | OPHTHALMIC | 0 refills | Status: DC

## 2017-07-20 MED ORDER — CLINDAMYCIN HCL 300 MG PO CAPS: 300.0000 mg | ORAL_CAPSULE | Freq: Three times a day (TID) | ORAL | 0 refills | Status: DC

## 2017-07-20 MED ORDER — HYDROXYZINE HCL 50 MG PO TABS: 50.0000 mg | ORAL_TABLET | Freq: Three times a day (TID) | ORAL | 0 refills | Status: DC | PRN

## 2017-07-20 NOTE — Progress Notes (Signed)
    Subjective:  Victoria [name] is a 76 y.o. female who presents today with a chief complaint of eye swelling.   HPI:  Eye Swelling, Acute Issue Started about a week ago. No recent illnesses. Tried some topical antibiotic ointment which did not seem to help. No fevers or chills. No sick contact. Some blurred vision. Some tenderness. No pain with eye movement. She did have some fine bumps that come up on the area about a week ago. No insect bites or other trauma.   ROS: Per HPI  PMH: Smoking history reviewed. Never smoker.   Objective:  Physical Exam: BP 140/80   Pulse 87   Ht 5' 3.5" (1.613 m)   Wt 148 lb 12.8 oz (67.5 kg)   SpO2 98%   BMI 25.95 kg/m   Gen: NAD, resting comfortably HEENT: Right eye with erythema and edema to upper and lower eyelids. Sclera mildly injected. EOMI without pain. PERRL.   Patient declined visual acuity testing.   Assessment/Plan:  Preseptal Cellulitis No signs of orbital cellulitis. No systemic symptoms. Discussed usual treatment including dual antibiotic therapy. Patient declined and stated that she wished to start with one antibiotic given her extensive history of allergies to antibiotics. Discussed the risks of this with patient. Will start clindamycin. Will also give Rx for polysporin ointment. Patient requested something in case she has a rash to the antibiotic - sent in hydroxyzine. Strict return precautions reviewed. Follow up as needed.   Algis Greenhouse. Jerline Pain, MD 07/20/2017 12:00 PM

## 2017-07-20 NOTE — Patient Instructions (Signed)
Start the clindamycin and ointment.  Let us know if your symptoms worsen or are not improving in 5-7 days.  Take care,  Dr Jerline Pain   Preseptal Cellulitis, Adult Preseptal cellulitis-also called periorbital cellulitis-is an infection that can affect your eyelid and the soft tissues or skin that surround your eye. The infection may also affect the structures that produce and drain your tears. It does not affect your eye itself. What are the causes? This condition may be caused by:  Bacterial infection.  Long-term (chronic) sinus infections.  An object (foreign body) that is stuck behind the eye.  An injury that: ? Goes through the eyelid tissues. ? Causes an infection, such as an insect sting.  Fracture of the bone around the eye.  Infections that have spread from the eyelid or other structures around the eye.  Bite wounds.  Inflammation or infection of the lining membranes of the brain (meningitis).  An infection in the blood (septicemia).  Dental infection (abscess).  Viral infection. This is rare.  What increases the risk? Risk factors for preseptal cellulitis include:  Participating in activities that increase your risk of trauma to the face or head, such as boxing or high-speed activities.  Having a weakened defense system (immune system).  Medical conditions, such as nasal polyps, that increase your risk for frequent or recurrent sinus infections.  Not receiving regular dental care.  What are the signs or symptoms? Symptoms of this condition usually come on suddenly. Symptoms may include:  Red, hot, and swollen eyelids.  Fever.  Difficulty opening your eye.  Eye pain.  How is this diagnosed? This condition may be diagnosed by an eye exam. You may also have tests, such as:  Blood tests.  CT scan.  MRI.  Spinal tap (lumbar puncture). This is a procedure that involves removing and examining a small amount of the fluid that surrounds the brain and  spinal cord. This checks for meningitis.  How is this treated? Treatment for this condition will include antibiotic medicines. These may be given by mouth (orally), through an IV, or as a shot. Your health care provider may also recommend nasal decongestants to reduce swelling. Follow these instructions at home:  Take your antibiotic medicine as directed by your health care provider. Finish all of it even if you start to feel better.  Take medicines only as directed by your health care provider.  Drink enough fluid to keep your urine clear or pale yellow.  Do not use any tobacco products, including cigarettes, chewing tobacco, or electronic cigarettes. If you need help quitting, ask your health care provider.  Keep all follow-up visits as directed by your health care provider. These include any visits with an eye specialist (ophthalmologist) or dentist. Contact a health care provider if:  You have a fever.  Your eyelids become more red, warm, or swollen.  You have new symptoms.  Your symptoms do not get better with treatment. Get help right away if:  You develop double vision, or your vision becomes blurred or worsens in any way.  You have trouble moving your eyes.  Your eye looks like it is sticking out or bulging out (proptosis).  You develop a severe headache, severe neck pain, or neck stiffness.  You develop repeated vomiting. This information is not intended to replace advice given to you by your health care provider. Make sure you discuss any questions you have with your health care provider. Document Released: 10/22/2010 Document Revised: 02/25/2016 Document Reviewed: 09/15/2014 Elsevier  Interactive Patient Education  [employer].

## 2017-08-07 ENCOUNTER — Ambulatory Visit (INDEPENDENT_AMBULATORY_CARE_PROVIDER_SITE_OTHER)
Admission: RE | Admit: 2017-08-07 | Discharge: 2017-08-07 | Disposition: A | Discharge status: Home / Self Care | Payer: Medicare Other | Source: Ambulatory Visit | Attending: Family Medicine | Admitting: Family Medicine

## 2017-08-07 DIAGNOSIS — M810 Age-related osteoporosis without current pathological fracture: Secondary | ICD-10-CM

## 2017-08-15 ENCOUNTER — Ambulatory Visit (INDEPENDENT_AMBULATORY_CARE_PROVIDER_SITE_OTHER): Payer: Medicare Other | Admitting: Orthopaedic Surgery

## 2017-08-15 ENCOUNTER — Ambulatory Visit (INDEPENDENT_AMBULATORY_CARE_PROVIDER_SITE_OTHER): Payer: Medicare Other

## 2017-08-15 ENCOUNTER — Encounter (INDEPENDENT_AMBULATORY_CARE_PROVIDER_SITE_OTHER): Payer: Self-pay | Admitting: Orthopaedic Surgery

## 2017-08-15 DIAGNOSIS — G8929 Other chronic pain: Secondary | ICD-10-CM | POA: Diagnosis not present

## 2017-08-15 DIAGNOSIS — M25551 Pain in right hip: Secondary | ICD-10-CM | POA: Diagnosis not present

## 2017-08-15 DIAGNOSIS — M5441 Lumbago with sciatica, right side: Secondary | ICD-10-CM

## 2017-08-15 NOTE — Progress Notes (Signed)
Office Visit Note   Patient: Victoria [name]           Date of Birth: October 20, 1940           MRN: [id number] Visit Date: 08/15/2017              Requested by: Briscoe Deutscher, [place] [address] [place], [place] [address] PCP: Briscoe Deutscher, DO   Assessment & Plan: Visit Diagnoses:  1. Pain in right hip   2. Chronic right-sided low back pain with right-sided sciatica     Plan: She understands that I feel this is more of a back and pelvic issue that is a hip at all.  She understands also that she should still consult with her primary care physician about her osteoporosis and what medicine she may need for that.  Certainly home exercise program with low impact cardiac activities will help with bone quality.  Also feel that she would benefit from formal physical therapy to work on modalities to try to calm down the pain on the right side of her low back and pelvis area.  I gave her a prescription for [facility] therapy.  If her back issues persist she should contact Dr. Vertell Limber in his office who performed back surgery and pelvic surgery on her in 2013.  She understands this as well.  Otherwise follow-up as needed.  All questions concerns were answered and addressed from my standpoint  Follow-Up Instructions: Return if symptoms worsen or fail to improve.   Orders:  Orders Placed This Encounter  Procedures  . XR HIP UNILAT W OR W/O PELVIS 1V RIGHT  . XR Lumbar Spine 2-3 Views   No orders of the defined types were placed in this encounter.     Procedures: No procedures performed   Clinical Data: No additional findings.   Subjective: Chief Complaint  Patient presents with  . Right Hip - Pain   Patient is a very pleasant 76 year old female who comes in with chief complaint of right hip pain is been hurting for years now she states.  Is been interfering with her life and she found out a week ago that she has osteoporosis.  She also has a history of low back surgery and  right-sided pelvic surgery that was done by 1 of the neurosurgeons in town Dr. Vertell Limber.  This was in 2013.  She denies any groin pain at all.  She said hurts to lay on that side at night.  She denies any pain rating past her knee or into her foot or ankle.  She is an active individual.  She does want to increase her physical activities and wonders what our recommendations would be for that.  She denies any change in bowel bladder function either. HPI  Review of Systems She currently denies any headache, chest pain, short of breath, fever, chills, nausea, vomiting.  Objective: Vital Signs: There were no vitals taken for this visit.  Physical Exam She is alert and oriented x3 and in no acute distress Ortho Exam Examination of both hips show full range of motion of both hips with no pain in the groin at all no blocks rotation.  I can fully flex and extend her hips and internally externally rotate her hips.  When I let her lay on her side with the right side up I do not elicit any pain over the trochanteric area or the iliotibial band.  She has a well-healed posterior incision over the iliac crest and posterior  pelvis area there is no evidence of infection negative.  Her pain is more deep in this area.  She has excellent strength in her bilateral lower extremities and notes numbness and tingling. Specialty Comments:  No specialty comments available.  Imaging: Xr Hip Unilat W Or W/o Pelvis 1v Right  Result Date: 08/15/2017 An AP pelvis and a lateral of the right hip shows no arthritic changes on the right hip.  It is well located.  The left hip reveals normal as well.  There is well-maintained joint space.  There is no cortical irregularities around the pelvis hip or trochanteric areas on either side.  Xr Lumbar Spine 2-3 Views  Result Date: 08/15/2017 2 views of the lumbar spine show some mild degenerative changes at several levels but no loss of lordosis or other acute findings.    PMFS  History: Patient Active Problem List   Diagnosis Date Noted  . Pain in right hip 08/15/2017  . Chronic right-sided low back pain with right-sided sciatica 08/15/2017  . Trochanteric bursitis of right hip 10/06/2014  . Essential hypertension, benign 05/14/2011  . Vitamin D deficiency 08/02/2010  . Allergic rhinitis 07/30/2010  . Coronary atherosclerosis 01/19/2009  . Hypothyroidism 12/04/2008  . Hyperlipidemia 12/04/2008  . Osteoporosis 12/04/2008   Past Medical History:  Diagnosis Date  . Acute lumbar radiculopathy 04/2012  . Atopic eczema   . CAD (coronary artery disease)   . Essential hypertension, benign   . Hyperlipidemia, statin intolerant   . Hypothyroidism   . Myocardial infarction ([place]) 2009  . NSTEMI (non-ST elevated myocardial infarction) ([place]) 2009  . Osteoporosis, s/p bisphophonates x 7 years, stopped in 2011   . Sacrum and coccyx fracture ([place])     Family History  Problem Relation Age of Onset  . Colon cancer Mother 35       1st degree  . Stroke Father 64  . Stroke Unknown        Grandfather- grandmother    Past Surgical History:  Procedure Laterality Date  . APPENDECTOMY    . CARDIAC CATHETERIZATION  2009  . CARPAL TUNNEL RELEASE Bilateral   . HIP SURGERY  1980s  . LAPAROSCOPIC HYSTERECTOMY    . TONSILLECTOMY     Social History   Occupational History  . Occupation: retired  Tobacco Use  . Smoking status: Never Smoker  . Smokeless tobacco: Never Used  Substance and Sexual Activity  . Alcohol use: Yes    Alcohol/week: 0.6 oz    Types: 1 Glasses of wine per week  . Drug use: No  . Sexual activity: Not on file

## 2017-08-23 ENCOUNTER — Telehealth (INDEPENDENT_AMBULATORY_CARE_PROVIDER_SITE_OTHER): Payer: Self-pay | Admitting: Orthopaedic Surgery

## 2017-08-23 NOTE — Telephone Encounter (Signed)
Patients Neurologist needs a copy of the x-ray cd and also a copy of the office visit notes for 08/15/17.  Patient would like a call once everything is ready and she can pick it up. CB # [phone]

## 2017-08-23 NOTE — Telephone Encounter (Signed)
CD has been made. I gave CD to [name] to go with records

## 2017-08-23 NOTE — Telephone Encounter (Signed)
Victoria [name]- can we get copy of CD  [name]- can we get some records together for her?

## 2017-09-01 ENCOUNTER — Telehealth (INDEPENDENT_AMBULATORY_CARE_PROVIDER_SITE_OTHER): Payer: Self-pay | Admitting: Orthopaedic Surgery

## 2017-09-01 NOTE — Telephone Encounter (Signed)
Spoke with patient. She will signe release form and pickup records & xray cd on Monday 09/04/17

## 2017-10-12 ENCOUNTER — Other Ambulatory Visit: Payer: Self-pay | Admitting: Neurosurgery

## 2017-10-12 DIAGNOSIS — M5416 Radiculopathy, lumbar region: Secondary | ICD-10-CM

## 2017-10-23 ENCOUNTER — Ambulatory Visit: Payer: Medicare Other | Attending: Family Medicine | Admitting: Audiology

## 2017-10-23 DIAGNOSIS — H93213 Auditory recruitment, bilateral: Secondary | ICD-10-CM | POA: Insufficient documentation

## 2017-10-23 DIAGNOSIS — R94128 Abnormal results of other function studies of ear and other special senses: Secondary | ICD-10-CM | POA: Insufficient documentation

## 2017-10-23 DIAGNOSIS — H903 Sensorineural hearing loss, bilateral: Secondary | ICD-10-CM | POA: Diagnosis present

## 2017-10-23 DIAGNOSIS — Z01118 Encounter for examination of ears and hearing with other abnormal findings: Secondary | ICD-10-CM | POA: Insufficient documentation

## 2017-10-23 DIAGNOSIS — H93299 Other abnormal auditory perceptions, unspecified ear: Secondary | ICD-10-CM

## 2017-10-23 NOTE — Procedures (Signed)
[facility] [place]  [place], [place] [address]  [phone]   Audiological Evaluation  Patient Name: Victoria [name]  Status: Outpatient   DOB: Jun 25, 1941    Diagnosis: Hearing Loss                 MRN: [id number] Date:  10/23/2017     Referent: Briscoe Deutscher, DO  History: Victoria [name] was seen for an audiological evaluation. Primary Concern: "gradually loosing my hearing".  Pain: None History of ear infections:  N History of ear surgery or "tubes" : N History of dizziness/vertigo:  N History of balance issues:  Y - "sometimes when first stand up, lasts a second or two".   Tinnitus: N Sound sensitivity: "May startle to loud sound but does not hurt ears". 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 History of hypertension: Y - controlled with medication History of diabetes:  N Family history of hearing loss:  Father, in old age. Other concerns: "May be having back surgery in the next month". "Had a heart attack in 2009".    Evaluation: Conventional pure tone audiometry from 250Hz  - 8000Hz  with using insert earphones. Due to back pain, Victoria [name] needed to stand up at times during testing today. Hearing Thresholds are 15-25 dbHL from 250Hz  - 2000Hz . In the high frequencies the right ear is better at 20 dBHL at 4000hz  and 60 dBHL at 8000Hz . With the left ear showing 30 dBHL at 4000Hz  and 70 dBHL at 8000Hz .  Reliability is good Speech reception levels (repeating words near threshold) using recorded spondee word lists:  Right ear: 15 dBHL.  Left ear:  20 dBHL Word recognition (at comfortably loud volumes) using recorded NU-6 word lists, in quiet.  Right ear: 100% at 55 dBHL.  Left ear:   100% at 60 dBHL Word recognition in minimal background noise:  +5 dBHL  Right ear: 76%                              Left ear:  60%  Tympanometry shows normal middle ear volume, pressure and  pressure (Type A) with present ipsilateral acoustic reflexes at 500Hz , 1000Hz  and 4000HZ  of 90-95 dB.   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  The left ear is poorer sloping from a mild to borderline severe high frequency hearing loss with the right ear having a mild to moderately severe high frequency hearing loss.  Slight recruitment is present bilaterally. This amount of hearing loss would adversely affect speech communication at normal conversational speech levels, especially when a competing message or background noise is present.  Word recognition is excellent in quiet at conversational speech levels bilaterally. In minimal background noise, word recognition drops to fair in the right ear and poor in the left ear.      Victoria [name] may benefit from amplification; therefore, a hearing aid evaluation is recommended. The test results were discussed and Victoria [name] counseled.   RECOMMENDATIONS: 1.   Monitor hearing closely with a repeat audiological evaluation in 6 months (earlier if there is any change in hearing or ear pressure) to rule out a progressive hearing loss.  This follow-up may be completed in conjunction with the hearing aid appointments. 2.   A hearing aid evaluation at the location of Victoria [name]'s choice.  3.   Strategies that help improve hearing include: A) Face the speaker directly. Optimal is having the speakers face well - lit.  Unless amplified, being within 3-6 feet of the speaker will enhance word recognition. B) Avoid having the speaker back-lit as this will minimize the ability to use cues from lip-reading, facial expression and gestures. C)  Word recognition is poorer in background noise. For optimal word recognition, turn off the TV, radio or noisy fan when engaging in conversation. In a restaurant, try to sit away from noise sources and close to the primary  speaker.  D)  Ask for topic clarification from time to time in order to remain in the conversation.  Most people don't mind repeating or clarifying a point when asked.  If needed, explain the difficulty hearing in background noise or hearing loss.   Deborah L. Heide Spark, Au.D., CCC-A Doctor of Audiology 10/23/2017 cc: Briscoe Deutscher, DO

## 2017-10-23 NOTE — Patient Instructions (Signed)
Strategies that help improve hearing include: A) Face the speaker directly. Optimal is having the speakers face well - lit.  Unless amplified, being within 3-6 feet of the speaker will enhance word recognition. B) Avoid having the speaker back-lit as this will minimize the ability to use cues from lip-reading, facial expression and gestures. C)  Word recognition is poorer in background noise. For optimal word recognition, turn off the TV, radio or noisy fan when engaging in conversation. In a restaurant, try to sit away from noise sources and close to the primary speaker.  D)  Ask for topic clarification from time to time in order to remain in the conversation.  Most people don't mind repeating or clarifying a point when asked.  If needed, explain the difficulty hearing in background noise or hearing loss.     Amplification helps make the signal louder and therefore often improves hearing and word recognition.  Amplification has many forms including hearing aids in one or both ears, an assistive listening device which have a microphone and speaker such as a small handheld device and/or even a surround sound system of speakers.  Amplification may be covered by some insurances, but not all.  It is important to note that hearing aids must be individually fit according to the hearing test results and the ear shape.  Audiologists and hearing aid dealers in [place] must be licensed in order to dispense hearing aids.  In addition, a trial period is mandated by law in our state because often amplification must be tried and then evaluated in order to determine benefit.       There are many excellent choices when it comes to amplification in our area and providers are listed in the phone book under hearing aids, may be affiliated with [facility], [place] [facility] Th[facility] physicians, are located at [place] as well as the [facility].   1)  Check insurance company preferred providers  first  [place]. Heide Spark, Au.D., CCC-A Doctor of Audiology 10/23/2017

## 2017-10-24 ENCOUNTER — Ambulatory Visit
Admission: RE | Admit: 2017-10-24 | Discharge: 2017-10-24 | Disposition: A | Discharge status: Home / Self Care | Payer: Medicare Other | Source: Ambulatory Visit | Attending: Neurosurgery | Admitting: Neurosurgery

## 2017-10-24 DIAGNOSIS — M5416 Radiculopathy, lumbar region: Secondary | ICD-10-CM

## 2017-10-24 MED ORDER — GADOBENATE DIMEGLUMINE 529 MG/ML IV SOLN
14.0000 mL | Freq: Once | INTRAVENOUS | Status: AC | PRN
  Administered 2017-10-24: 14 mL via INTRAVENOUS

## 2017-11-20 ENCOUNTER — Ambulatory Visit: Payer: Medicare Other | Attending: Family Medicine

## 2017-11-20 ENCOUNTER — Other Ambulatory Visit: Payer: Self-pay

## 2017-11-20 DIAGNOSIS — M6281 Muscle weakness (generalized): Secondary | ICD-10-CM | POA: Diagnosis present

## 2017-11-20 DIAGNOSIS — M5441 Lumbago with sciatica, right side: Secondary | ICD-10-CM | POA: Diagnosis present

## 2017-11-20 DIAGNOSIS — R252 Cramp and spasm: Secondary | ICD-10-CM | POA: Insufficient documentation

## 2017-11-20 DIAGNOSIS — G8929 Other chronic pain: Secondary | ICD-10-CM | POA: Diagnosis present

## 2017-11-20 NOTE — Therapy (Signed)
[facility] [address] [place], [place], [address] Phone: [phone]   Fax:  [phone]  Physical Therapy Evaluation  Patient Details  Name: Victoria [name] MRN: [id number] Date of Birth: 07-31-1941 Referring Provider: Erline Levine, MD   Encounter Date: 11/20/2017  Victoria [name] End of Session - 11/20/17 1020    Visit Number  1    Date for Victoria [name] Re-Evaluation  01/15/18    Authorization Type  [employer] Medicare    Victoria [name] Start Time  0932    Victoria [name] Stop Time  1018    Victoria [name] Time Calculation (min)  46 min    Activity Tolerance  Patient tolerated treatment well    Behavior During Therapy  [facility] for tasks assessed/performed       Past Medical History:  Diagnosis Date  . Acute lumbar radiculopathy 04/2012  . Atopic eczema   . CAD (coronary artery disease)   . Essential hypertension, benign   . Hyperlipidemia, statin intolerant   . Hypothyroidism   . Myocardial infarction ([place]) 2009  . NSTEMI (non-ST elevated myocardial infarction) ([place]) 2009  . Osteoporosis, s/p bisphophonates x 7 years, stopped in 2011   . Sacrum and coccyx fracture [facility])     Past Surgical History:  Procedure Laterality Date  . APPENDECTOMY    . CARDIAC CATHETERIZATION  2009  . CARPAL TUNNEL RELEASE Bilateral   . HIP SURGERY  1980s  . LAPAROSCOPIC HYSTERECTOMY    . LEFT HEART CATHETERIZATION WITH CORONARY ANGIOGRAM N/A 11/18/2013   Procedure: LEFT HEART CATHETERIZATION WITH CORONARY ANGIOGRAM;  Surgeon: Blane Ohara, MD;  Location: [facility] CATH LAB;  Service: Cardiovascular;  Laterality: N/A;  . LUMBAR LAMINECTOMY/DECOMPRESSION MICRODISCECTOMY  04/17/2012   Procedure: LUMBAR LAMINECTOMY/DECOMPRESSION MICRODISCECTOMY 1 LEVEL;  Surgeon: Erline Levine, MD;  Location: [place] NEURO ORS;  Service: Neurosurgery;  Laterality: Left;  Left Lumbar five-Sacral one Microdiskectomy  . TONSILLECTOMY      There were no vitals filed for this visit.   Subjective Assessment - 11/20/17  0932    Subjective  Victoria [name] presents to Victoria [name] with complaints of LBP and Rt LE radiculopathy. Victoria [name] reports that Rt hip pain began 3-4 months ago without incident or injury.  Victoria [name] had lumbar microdiscectomy at L5/S1 in 2013.      Pertinent History  osteoporosis, steroid injection into spine    Limitations  Sitting;Standing;Walking    How long can you sit comfortably?  45 minutes to an hour    How long can you stand comfortably?  30 minutes    How long can you walk comfortably?  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  Yes    Pain Score  2  up to 6/10    Pain Location  Back    Pain Orientation  Left;Right    Pain Descriptors / Indicators  Sore;Dull;Constant    Pain Type  Chronic pain    Pain Onset  More than a month ago    Pain Frequency  Constant    Aggravating Factors   standing, walking, sitting    Pain Relieving Factors  medication, rest         [facility] Victoria [name] Assessment - 11/20/17 0001      Assessment   Medical Diagnosis  radiculopathy, lumbar region    Referring Provider  Erline Levine, MD  Onset Date/Surgical Date  07/20/17    Next MD Visit  4 weeks for injection    Prior Therapy  none      Precautions   Precautions  Other (comment) osteoporosis      Restrictions   Weight Bearing Restrictions  No      Balance Screen   Has the patient fallen in the past 6 months  No    Has the patient had a decrease in activity level because of a fear of falling?   No    Is the patient reluctant to leave their home because of a fear of falling?   No      Home Film/video editor residence    Living Arrangements  Spouse/significant other    Type of [place] Access  Level entry    [place]  One level      Prior Function   Level of Independence  Independent    Vocation  Retired    Leisure  [employer], make  greeting cards, housework      Cognition   Overall Cognitive Status  Within Functional Limits for tasks assessed      Observation/Other Assessments   Focus on Therapeutic Outcomes (FOTO)   60% limitations      Posture/Postural Control   Posture/Postural Control  Postural limitations    Postural Limitations  Forward head;Decreased lumbar lordosis      ROM / Strength   AROM / PROM / Strength  AROM;PROM;Strength      AROM   Overall AROM   Within functional limits for tasks performed    Overall AROM Comments  Lumbar A/ROM is full and hip A/ROM is full without pain      PROM   Overall PROM   Within functional limits for tasks performed      Strength   Overall Strength  Deficits    Overall Strength Comments  Lt hip and knee strength is 4+/5 to 5/5    Strength Assessment Site  Knee;Hip;Ankle    Right/Left Hip  Right    Right Hip Flexion  4/5    Right Hip ABduction  4/5    Right/Left Knee  Right    Right Knee Flexion  4-/5    Right Knee Extension  4/5    Right/Left Ankle  Right    Right Ankle Dorsiflexion  4+/5      Palpation   Palpation comment  Victoria [name] with palpable tenderness and trigger points over Rt>Lt lumbar gluteals and bil lumbar paraspinals      Special Tests    Special Tests  Lumbar    Lumbar Tests  Straight Leg Raise      Straight Leg Raise   Findings  Positive    Side   Right      Transfers   Transfers  Independent with all Transfers      Ambulation/Gait   Ambulation/Gait  Yes    Ambulation/Gait Assistance  6: Modified independent (Device/Increase time)    Gait Pattern  Within Functional Limits             Objective measurements completed on examination: See above findings.              Victoria [name] Education - 11/20/17 1011    Education provided  Yes    Education Details  lumbar flexibility    Person(s) Educated  Patient    Methods  Explanation;Handout    Comprehension  Verbalized understanding;Returned demonstration       Victoria [name] Short Term Goals  - 11/20/17 1031      Victoria [name] SHORT TERM GOAL #1   Title  be independent in initial HEP    Time  4    Period  Weeks    Status  New    Target Date  12/18/17      Victoria [name] SHORT TERM GOAL #2   Title  initiate a walking program and complete 15-20 minutes of walking daily without limitation    Time  4    Period  Weeks    Status  New    Target Date  12/18/17      Victoria [name] SHORT TERM GOAL #3   Title  reporta 30% reduction in Rt LE pain with sitting, standing and walking    Time  4    Period  Weeks    Status  New    Target Date  12/18/17      Victoria [name] SHORT TERM GOAL #4   Title  verbalize and demosntrate body mechanics modifications for house work and bed mobility to protect lumbar spine    Time  4    Period  Weeks    Status  New    Target Date  12/18/17        Victoria [name] Long Term Goals - 11/20/17 1048      Victoria [name] LONG TERM GOAL #1   Title  be independent in advanced HEP    Time  8    Period  Weeks    Status  New    Target Date  01/15/18      Victoria [name] LONG TERM GOAL #2   Title  report a 60% reduction in Rt LE pain with sitting, standing and walking    Time  8    Period  Weeks    Status  New    Target Date  01/15/18      Victoria [name] LONG TERM GOAL #3   Title  stand for 45 minutes to an hour to improve community independence and endurance    Time  8    Period  Weeks    Status  New    Target Date  01/15/18      Victoria [name] LONG TERM GOAL #4   Title  exercise > or = to 3x/wk without limitation due to pain    Time  8    Period  Weeks    Status  New    Target Date  01/15/18      Victoria [name] LONG TERM GOAL #5   Title  reduce FOTO to < or = to 47% limitation    Time  8    Period  Weeks    Target Date  01/15/18             Plan - 11/20/17 1021    Clinical Impression Statement  Victoria [name] presents to Victoria [name] with onset of Rt lumbar, glutal and lateral thigh pain that began 4 months ago without cause.  Victoria [name] had lumbar discectiomy at L5/S1 in 2013.  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  Victoria [name] had injection last  week without change in pain.  Victoria [name] demonstrates Rt LE weakness, tension and trigger points over Rt lumbar spine and gluteals >Lt.  Reduced PA mobility in the lumbar spine with pain today.  Victoria [name] with slow mobility.  Victoria [name] reports 2-6/10 constant aching pain in the Rt lumbar spine and gluteals/hip.  Victoria [name] is  limited to sitting > 10 minutes in the clinic today and reports that she is limited to standing and walking 30 minutes.  Victoria [name] wants to exercise due for her cardiovascular health and bone density.  Victoria [name] will benefit from skilled Victoria [name] for core strength, manual therapy, modalities and functional mobility training.    History and Personal Factors relevant to plan of care:  lumbar surgery 2013, osteoporosis    Clinical Presentation  Stable    Clinical Decision Making  Low    Rehab Potential  Good    Victoria [name] Frequency  2x / week    Victoria [name] Duration  8 weeks    Victoria [name] Treatment/Interventions  ADLs/Self Care Home Management;Cryotherapy;Electrical Stimulation;Ultrasound;Moist Heat;Gait training;Functional mobility training;Therapeutic activities;Therapeutic exercise;Patient/family education;Neuromuscular re-education;Manual techniques;Passive range of motion;Dry needling;Taping;Vasopneumatic Device;Traction    Victoria [name] Next Visit Plan  dry needling to lumbar multifidi and gluteals, flexibility, lumbar traction, e-stim    Consulted and Agree with Plan of Care  Patient       Patient will benefit from skilled therapeutic intervention in order to improve the following deficits and impairments:  Pain, Increased muscle spasms, Postural dysfunction, Decreased activity tolerance, Decreased range of motion, Decreased strength, Impaired UE functional use, Difficulty walking, Impaired flexibility  Visit Diagnosis: Chronic bilateral low back pain with right-sided sciatica - Plan: Victoria [name] plan of care cert/re-cert  Cramp and spasm - Plan: Victoria [name] plan of care cert/re-cert  Muscle weakness (generalized) - Plan: Victoria [name] plan of care cert/re-cert     Problem  List Patient Active Problem List   Diagnosis Date Noted  . Pain in right hip 08/15/2017  . Chronic right-sided low back pain with right-sided sciatica 08/15/2017  . Trochanteric bursitis of right hip 10/06/2014  . Essential hypertension, benign 05/14/2011  . Vitamin D deficiency 08/02/2010  . Allergic rhinitis 07/30/2010  . Coronary atherosclerosis 01/19/2009  . Hypothyroidism 12/04/2008  . Hyperlipidemia 12/04/2008  . Osteoporosis 12/04/2008   Victoria [name], Victoria [name] 11/20/17 10:59 AM  [facility] [address], [place], [place], [address] Phone: [phone]   Fax:  [phone]  Name: Victoria [name] MRN: [id number] Date of Birth: 1941-09-08

## 2017-11-20 NOTE — Patient Instructions (Addendum)
Perform all exercises below:  Hold _20___ seconds. Repeat _3___ times.  Do __3__ sessions per day. CAUTION: Movement should be gentle, steady and slow.  Knee to Chest  Lying supine, bend involved knee to chest. Perform with each leg.  Copyright  VHI. All rights reserved.  Double Knee to Chest (Flexion)   Gently pull both knees toward chest. Feel stretch in lower back or buttock area. Breathing deeply, Lumbar Rotation: Caudal - Bilateral (Supine)  Feet and knees together, arms outstretched, rotate knees left, turning head in opposite direction, until stretch is felt.      HIP: Hamstrings - Short Sitting   Rest leg on raised surface. Keep knee straight. Lift chest.   [facility] [address], [address] [place], [place] [address] Phone # [phone] Fax [phone] 

## 2017-11-28 ENCOUNTER — Ambulatory Visit: Payer: Medicare Other

## 2017-11-28 DIAGNOSIS — M5441 Lumbago with sciatica, right side: Principal | ICD-10-CM

## 2017-11-28 DIAGNOSIS — G8929 Other chronic pain: Secondary | ICD-10-CM

## 2017-11-28 DIAGNOSIS — R252 Cramp and spasm: Secondary | ICD-10-CM

## 2017-11-28 DIAGNOSIS — M6281 Muscle weakness (generalized): Secondary | ICD-10-CM

## 2017-11-28 NOTE — Patient Instructions (Signed)
   Hip Stretch  Put right ankle over left knee. Let right knee fall downward, but keep ankle in place. Feel the stretch in hip. May push down gently with hand to feel stretch. Hold ___20_ seconds while counting out loud. Repeat with other leg. Repeat __3__ times. Do __3__ sessions per day.     Stretching: Piriformis (Supine)  Pull right knee toward opposite shoulder. Hold __20__ seconds. Relax. Repeat __3__ times per set. Do __3__ sets per session. Do ____ sessions per day.      Lifting Principles  .Maintain proper posture and head alignment. .Slide object as close as possible before lifting. .Move obstacles out of the way. .Test before lifting; ask for help if too heavy. .Tighten stomach muscles without holding breath. .Use smooth movements; do not jerk. .Use legs to do the work, and pivot with feet. .Distribute the work load symmetrically and close to the center of trunk. .Push instead of pull whenever possible.   Squat down and hold basket close to stand. Use leg muscles to do the work.    Avoid twisting or bending back. Pivot around using foot movements, and bend at knees if needed when reaching for articles.        Getting Into / Out of Bed   Lower self to lie down on one side by raising legs and lowering head at the same time. Use arms to assist moving without twisting. Bend both knees to roll onto back if desired. To sit up, start from lying on side, and use same move-ments in reverse. Keep trunk aligned with legs.    Shift weight from front foot to back foot as item is lifted off shelf.    When leaning forward to pick object up from floor, extend one leg out behind. Keep back straight. Hold onto a sturdy support with other hand.      Sit upright, head facing forward. Try using a roll to support lower back. Keep shoulders relaxed, and avoid rounded back. Keep hips level with knees. Avoid crossing legs for long periods.     [facility] [address] [place], [place] [address] Phone # [phone] Fax [phone]

## 2017-11-28 NOTE — Therapy (Signed)
[facility] [address] [place], [place], [address] Phone: [phone]   Fax:  [phone]  Physical Therapy Treatment  Patient Details  Name: Victoria [name] MRN: [id number] Date of Birth: Oct 05, 1940 Referring Provider: Erline Levine, MD   Encounter Date: 11/28/2017  PT End of Session - 11/28/17 1058    Visit Number  2    Date for PT Re-Evaluation  01/15/18    Authorization Type  UHC Medicare    PT Start Time  8242    PT Stop Time  1110    PT Time Calculation (min)  55 min    Activity Tolerance  Patient tolerated treatment well    Behavior During Therapy  [facility] for tasks assessed/performed       Past Medical History:  Diagnosis Date  . Acute lumbar radiculopathy 04/2012  . Atopic eczema   . CAD (coronary artery disease)   . Essential hypertension, benign   . Hyperlipidemia, statin intolerant   . Hypothyroidism   . Myocardial infarction ([place]) 2009  . NSTEMI (non-ST elevated myocardial infarction) ([place]) 2009  . Osteoporosis, s/p bisphophonates x 7 years, stopped in 2011   . Sacrum and coccyx fracture [facility])     Past Surgical History:  Procedure Laterality Date  . APPENDECTOMY    . CARDIAC CATHETERIZATION  2009  . CARPAL TUNNEL RELEASE Bilateral   . HIP SURGERY  1980s  . LAPAROSCOPIC HYSTERECTOMY    . LEFT HEART CATHETERIZATION WITH CORONARY ANGIOGRAM N/A 11/18/2013   Procedure: LEFT HEART CATHETERIZATION WITH CORONARY ANGIOGRAM;  Surgeon: Blane Ohara, MD;  Location: [facility] CATH LAB;  Service: Cardiovascular;  Laterality: N/A;  . LUMBAR LAMINECTOMY/DECOMPRESSION MICRODISCECTOMY  04/17/2012   Procedure: LUMBAR LAMINECTOMY/DECOMPRESSION MICRODISCECTOMY 1 LEVEL;  Surgeon: Erline Levine, MD;  Location: [place] NEURO ORS;  Service: Neurosurgery;  Laterality: Left;  Left Lumbar five-Sacral one Microdiskectomy  . TONSILLECTOMY      There were no vitals filed for this visit.  Subjective Assessment - 11/28/17 1018     Subjective  My back feels better.  I have walked for 10 minutes 3 times so far.  I want to wait until I have done a little more exercise before I try dry needling.    Pertinent History  osteoporosis, steroid injection into spine.  Pt is allergic to many soaps and wants to bring her own or wear gloves while here.    Currently in Pain?  Yes    Pain Score  2     Pain Location  Back    Pain Orientation  Left;Right    Pain Descriptors / Indicators  Sore;Dull;Constant    Pain Type  Chronic pain    Pain Onset  More than a month ago    Pain Frequency  Constant    Aggravating Factors   standing, walking, sitting    Pain Relieving Factors  medication, rest                      [facility] Adult PT Treatment/Exercise - 11/28/17 0001      Exercises   Exercises  Knee/Hip;Lumbar      Lumbar Exercises: Stretches   Active Hamstring Stretch  Left;Right;3 reps;20 seconds    Single Knee to Chest Stretch  Left;Right;3 reps;20 seconds    Lower Trunk Rotation  3 reps;20 seconds    Piriformis Stretch  Left;Right;3 reps;20 seconds    Figure 4 Stretch  3 reps;20 seconds;Seated      Lumbar  Exercises: Aerobic   Nustep  Level 2 x 6 minutes PT present to discuss progress with pt      Modalities   Modalities  Electrical Stimulation;Moist Heat      Moist Heat Therapy   Number Minutes Moist Heat  15 Minutes    Moist Heat Location  Lumbar Spine      Electrical Stimulation   Electrical Stimulation Location  bil low back    Electrical Stimulation Action  IFC    Electrical Stimulation Parameters  15 minutes    Electrical Stimulation Goals  Pain             PT Education - 11/28/17 1041    Education provided  Yes    Education Details  piriformis, body mechanics    Person(s) Educated  Patient    Methods  Explanation;Demonstration;Handout    Comprehension  Verbalized understanding;Returned demonstration       PT Short Term Goals - 11/28/17 1046      PT SHORT TERM GOAL #1   Title  be  independent in initial HEP    Time  4    Period  Weeks    Status  On-going      PT SHORT TERM GOAL #4   Title  verbalize and demosntrate body mechanics modifications for house work and bed mobility to protect lumbar spine    Status  Achieved        PT Long Term Goals - 11/20/17 1048      PT LONG TERM GOAL #1   Title  be independent in advanced HEP    Time  8    Period  Weeks    Status  New    Target Date  01/15/18      PT LONG TERM GOAL #2   Title  report a 60% reduction in Rt LE pain with sitting, standing and walking    Time  8    Period  Weeks    Status  New    Target Date  01/15/18      PT LONG TERM GOAL #3   Title  stand for 45 minutes to an hour to improve community independence and endurance    Time  8    Period  Weeks    Status  New    Target Date  01/15/18      PT LONG TERM GOAL #4   Title  exercise > or = to 3x/wk without limitation due to pain    Time  8    Period  Weeks    Status  New    Target Date  01/15/18      PT LONG TERM GOAL #5   Title  reduce FOTO to < or = to 47% limitation    Time  8    Period  Weeks    Target Date  01/15/18            Plan - 11/28/17 1032    Clinical Impression Statement  Pt with only 1 session after evaluation.  Pt is independent and compliant with HEP for flexibility.  PT added new stretches today.  Pt has received body mechanics education for home tasks and verbalized understanding.  Pt reports that she already feels better after starting exercise.  Pt will continue to benefit from skilled PT for core strength, manual, modalities and functional mobility.    Rehab Potential  Good    PT Frequency  2x / week    PT Duration  8 weeks    PT Treatment/Interventions  ADLs/Self Care Home Management;Cryotherapy;Electrical Stimulation;Ultrasound;Moist Heat;Gait training;Functional mobility training;Therapeutic activities;Therapeutic exercise;Patient/family education;Neuromuscular re-education;Manual techniques;Passive range  of motion;Dry needling;Taping;Vasopneumatic Device;Traction    PT Next Visit Plan  flexibility and core strength, manual, e-stim, pursue home TENs    Consulted and Agree with Plan of Care  Patient       Patient will benefit from skilled therapeutic intervention in order to improve the following deficits and impairments:  Pain, Increased muscle spasms, Postural dysfunction, Decreased activity tolerance, Decreased range of motion, Decreased strength, Impaired UE functional use, Difficulty walking, Impaired flexibility  Visit Diagnosis: Chronic bilateral low back pain with right-sided sciatica  Cramp and spasm  Muscle weakness (generalized)     Problem List Patient Active Problem List   Diagnosis Date Noted  . Pain in right hip 08/15/2017  . Chronic right-sided low back pain with right-sided sciatica 08/15/2017  . Trochanteric bursitis of right hip 10/06/2014  . Essential hypertension, benign 05/14/2011  . Vitamin D deficiency 08/02/2010  . Allergic rhinitis 07/30/2010  . Coronary atherosclerosis 01/19/2009  . Hypothyroidism 12/04/2008  . Hyperlipidemia 12/04/2008  . Osteoporosis 12/04/2008     Sigurd Sos, PT 11/28/17 10:59 AM  [facility] [address], [place], [place], [address] Phone: [phone]   Fax:  [phone]  Name: Victoria [name] MRN: [id number] Date of Birth: [birth year]

## 2017-11-30 ENCOUNTER — Ambulatory Visit: Payer: Medicare Other

## 2017-11-30 DIAGNOSIS — M5441 Lumbago with sciatica, right side: Principal | ICD-10-CM

## 2017-11-30 DIAGNOSIS — G8929 Other chronic pain: Secondary | ICD-10-CM

## 2017-11-30 DIAGNOSIS — R252 Cramp and spasm: Secondary | ICD-10-CM

## 2017-11-30 DIAGNOSIS — M6281 Muscle weakness (generalized): Secondary | ICD-10-CM

## 2017-11-30 NOTE — Patient Instructions (Addendum)
Lower abdominal/core stability exercises  1. Practice your breathing technique: Inhale through your nose expanding your belly and rib cage. Try not to breathe into your chest. Exhale slowly and gradually out your mouth feeling a sense of softness to your body. Practice multiple times. This can be performed unlimited.  2. Finding the lower abdominals. Laying on your back with the knees bent, place your fingers just below your belly button. Using your breathing technique from above, on your exhale gently pull the belly button away from your fingertips without tensing any other muscles. Practice this 5x. Next, as you exhale, draw belly button inwards and hold onto it...then feel as if you are pulling that muscle across your pelvis like you are tightening a belt. This can be hard to do at first so be patient and practice. Do 5-10 reps 1-3 x day. Always recognize quality over quantity; if your abdominal muscles become tired you will notice you may tighten/contract other muscles. This is the time to take a break.   Practice this first laying on your back, then in sitting, progressing to standing and finally adding it to all your daily movements.   3. Finding your pelvic floor. Using the breathing technique above, when your exhale, this time draw your pelvic floor muscles up as if you were attempting to stop the flow of urination. Be careful NOT to tense any other muscles. This can be hard, BE PATIENT. Try to hold up to 10 seconds repeating 10x. Try 2x a day. Once you feel you are doing this well, add this contraction to exercise #2. First contracting your pelvic floor followed by lower abdominals.  4. Adding leg movements. Add the following leg movements to challenge your ability to keep your core stable:  1. Single leg drop outs: Laying on your back with knees bent feet flat. Inhale,  dropping one knee outward KEEPING YOUR PELVIS STILL. Exhale as you bring the leg back, simultaneously performing your lower  abdominal contraction. Do 5-10 on each leg.   [facility] [address], [address] [place], [place] [address] Phone # [phone] Fax [phone] 

## 2017-11-30 NOTE — Therapy (Signed)
[facility] [address], [place], [place], [address] Phone: [phone]   Fax:  [phone]  Physical Therapy Treatment  Patient Details  Name: Victoria [name] MRN: [id number] Date of Birth: [birth year] Referring Provider: Erline Levine, MD   Encounter Date: 11/30/2017  Victoria [name] End of Session - 11/30/17 1059    Visit Number  3    Date for Victoria [name] Re-Evaluation  01/15/18    Authorization Type  UHC Medicare    Victoria [name] Start Time  3244    Victoria [name] Stop Time  1058    Victoria [name] Time Calculation (min)  43 min       Past Medical History:  Diagnosis Date  . Acute lumbar radiculopathy 04/2012  . Atopic eczema   . CAD (coronary artery disease)   . Essential hypertension, benign   . Hyperlipidemia, statin intolerant   . Hypothyroidism   . Myocardial infarction ([place]) 2009  . NSTEMI (non-ST elevated myocardial infarction) ([place]) 2009  . Osteoporosis, s/p bisphophonates x 7 years, stopped in 2011   . Sacrum and coccyx fracture [facility])     Past Surgical History:  Procedure Laterality Date  . APPENDECTOMY    . CARDIAC CATHETERIZATION  2009  . CARPAL TUNNEL RELEASE Bilateral   . HIP SURGERY  1980s  . LAPAROSCOPIC HYSTERECTOMY    . LEFT HEART CATHETERIZATION WITH CORONARY ANGIOGRAM N/A 11/18/2013   Procedure: LEFT HEART CATHETERIZATION WITH CORONARY ANGIOGRAM;  Surgeon: Blane Ohara, MD;  Location[facility] CATH LAB;  Service: Cardiovascular;  Laterality: N/A;  . LUMBAR LAMINECTOMY/DECOMPRESSION MICRODISCECTOMY  04/17/2012   Procedure: LUMBAR LAMINECTOMY/DECOMPRESSION MICRODISCECTOMY 1 LEVEL;  Surgeon: Erline Levine, MD;  Location: [place] NEURO ORS;  Service: Neurosurgery;  Laterality: Left;  Left Lumbar five-Sacral one Microdiskectomy  . TONSILLECTOMY      There were no vitals filed for this visit.  Subjective Assessment - 11/30/17 1019    Subjective  I feel stiff today.  I dont think the e-stim helped my pain.      Currently in Pain?  Yes    Pain  Score  4     Pain Location  Back    Pain Orientation  Right;Left;Lower    Pain Descriptors / Indicators  Sore;Tightness;Constant                      [facility] Adult Victoria [name] Treatment/Exercise - 11/30/17 0001      Lumbar Exercises: Stretches   Active Hamstring Stretch  Left;Right;3 reps;20 seconds    Single Knee to Chest Stretch  Left;Right;3 reps;20 seconds    Lower Trunk Rotation  3 reps;20 seconds    Piriformis Stretch  Left;Right;3 reps;20 seconds    Figure 4 Stretch  3 reps;20 seconds;Seated      Lumbar Exercises: Supine   Ab Set  20 reps;5 seconds    Pelvic Tilt Limitations  ab set with hip abduction      Modalities   Modalities  --      Moist Heat Therapy   Number Minutes Moist Heat  --    Moist Heat Location  --      Acupuncturist Location  --    Printmaker Action  --    Electrical Stimulation Parameters  --    Printmaker Goals  --             Victoria [name] Education - 11/30/17 1047    Education provided  Yes    Education Details  TA activation, how to activate core with activities at home    Person(s) Educated  Patient    Methods  Explanation;Handout    Comprehension  Verbalized understanding;Returned demonstration       Victoria [name] Short Term Goals - 11/28/17 1046      Victoria [name] SHORT TERM GOAL #1   Title  be independent in initial HEP    Time  4    Period  Weeks    Status  On-going      Victoria [name] SHORT TERM GOAL #4   Title  verbalize and demosntrate body mechanics modifications for house work and bed mobility to protect lumbar spine    Status  Achieved        Victoria [name] Long Term Goals - 11/20/17 1048      Victoria [name] LONG TERM GOAL #1   Title  be independent in advanced HEP    Time  8    Period  Weeks    Status  New    Target Date  01/15/18      Victoria [name] LONG TERM GOAL #2   Title  report a 60% reduction in Rt LE pain with sitting, standing and walking    Time  8    Period  Weeks    Status  New    Target Date  01/15/18       Victoria [name] LONG TERM GOAL #3   Title  stand for 45 minutes to an hour to improve community independence and endurance    Time  8    Period  Weeks    Status  New    Target Date  01/15/18      Victoria [name] LONG TERM GOAL #4   Title  exercise > or = to 3x/wk without limitation due to pain    Time  8    Period  Weeks    Status  New    Target Date  01/15/18      Victoria [name] LONG TERM GOAL #5   Title  reduce FOTO to < or = to 47% limitation    Time  8    Period  Weeks    Target Date  01/15/18            Plan - 11/30/17 1031    Clinical Impression Statement  Victoria [name] is independent and compliant with HEP for flexibility.  Victoria [name] instructed Victoria [name] in core exercises today.  Victoria [name] is making body mechanics modifications at home.  Victoria [name] with increased pain and stiffness today.  Victoria [name] initiated TA activation exercises today.  Victoria [name] declined electrical stimulation today.  Victoria [name] will continue to benefit from skilled Victoria [name] for core strength, flexibility and manual/modalities as needed.      Rehab Potential  Good    Victoria [name] Frequency  2x / week    Victoria [name] Duration  8 weeks    Victoria [name] Treatment/Interventions  ADLs/Self Care Home Management;Cryotherapy;Electrical Stimulation;Ultrasound;Moist Heat;Gait training;Functional mobility training;Therapeutic activities;Therapeutic exercise;Patient/family education;Neuromuscular re-education;Manual techniques;Passive range of motion;Dry needling;Taping;Vasopneumatic Device;Traction    Victoria [name] Next Visit Plan  review TA exercises, hip and lumbar flexibility    Recommended Other Services  initial certification is signed    Consulted and Agree with Plan of Care  Patient       Patient will benefit from skilled therapeutic intervention in order to improve the following deficits and impairments:  Pain, Increased muscle spasms, Postural dysfunction, Decreased activity tolerance, Decreased range of motion, Decreased strength, Impaired UE functional use, Difficulty walking, Impaired flexibility  Visit Diagnosis: Chronic bilateral  low  back pain with right-sided sciatica  Cramp and spasm  Muscle weakness (generalized)     Problem List Patient Active Problem List   Diagnosis Date Noted  . Pain in right hip 08/15/2017  . Chronic right-sided low back pain with right-sided sciatica 08/15/2017  . Trochanteric bursitis of right hip 10/06/2014  . Essential hypertension, benign 05/14/2011  . Vitamin D deficiency 08/02/2010  . Allergic rhinitis 07/30/2010  . Coronary atherosclerosis 01/19/2009  . Hypothyroidism 12/04/2008  . Hyperlipidemia 12/04/2008  . Osteoporosis 12/04/2008     Victoria [name], Victoria [name] 11/30/17 11:01 AM  [facility] [address], [place], [place], [address] Phone: [phone]   Fax:  [phone]  Name: Victoria [name] MRN: [id number] Date of Birth: December 09, 1940

## 2017-12-05 ENCOUNTER — Ambulatory Visit: Payer: Medicare Other | Attending: Family Medicine

## 2017-12-05 DIAGNOSIS — R252 Cramp and spasm: Secondary | ICD-10-CM | POA: Diagnosis present

## 2017-12-05 DIAGNOSIS — M5441 Lumbago with sciatica, right side: Secondary | ICD-10-CM | POA: Insufficient documentation

## 2017-12-05 DIAGNOSIS — M6281 Muscle weakness (generalized): Secondary | ICD-10-CM | POA: Diagnosis present

## 2017-12-05 DIAGNOSIS — G8929 Other chronic pain: Secondary | ICD-10-CM | POA: Diagnosis present

## 2017-12-05 NOTE — Patient Instructions (Addendum)

## 2017-12-05 NOTE — Therapy (Signed)
[facility] [address], [place], [place], [address] Phone: [phone]   Fax:  [phone]  Physical Therapy Treatment  Patient Details  Name: Victoria [name] MRN: [id number] Date of Birth: 1941-07-31 Referring Provider: Erline Levine, MD   Encounter Date: 12/05/2017  PT End of Session - 12/05/17 1017    Visit Number  4    Date for PT Re-Evaluation  01/15/18    Authorization Type  UHC Medicare    PT Start Time  0930    PT Stop Time  1027    PT Time Calculation (min)  57 min    Activity Tolerance  Patient tolerated treatment well    Behavior During Therapy  [facility] for tasks assessed/performed       Past Medical History:  Diagnosis Date  . Acute lumbar radiculopathy 04/2012  . Atopic eczema   . CAD (coronary artery disease)   . Essential hypertension, benign   . Hyperlipidemia, statin intolerant   . Hypothyroidism   . Myocardial infarction ([place]) 2009  . NSTEMI (non-ST elevated myocardial infarction) ([place]) 2009  . Osteoporosis, s/p bisphophonates x 7 years, stopped in 2011   . Sacrum and coccyx fracture [facility])     Past Surgical History:  Procedure Laterality Date  . APPENDECTOMY    . CARDIAC CATHETERIZATION  2009  . CARPAL TUNNEL RELEASE Bilateral   . HIP SURGERY  1980s  . LAPAROSCOPIC HYSTERECTOMY    . LEFT HEART CATHETERIZATION WITH CORONARY ANGIOGRAM N/A 11/18/2013   Procedure: LEFT HEART CATHETERIZATION WITH CORONARY ANGIOGRAM;  Surgeon: Blane Ohara, MD;  Location: [facility] CATH LAB;  Service: Cardiovascular;  Laterality: N/A;  . LUMBAR LAMINECTOMY/DECOMPRESSION MICRODISCECTOMY  04/17/2012   Procedure: LUMBAR LAMINECTOMY/DECOMPRESSION MICRODISCECTOMY 1 LEVEL;  Surgeon: Erline Levine, MD;  Location: [place] NEURO ORS;  Service: Neurosurgery;  Laterality: Left;  Left Lumbar five-Sacral one Microdiskectomy  . TONSILLECTOMY      There were no vitals filed for this visit.  Subjective Assessment - 12/05/17 0934     Subjective  I have been feeling achy all over.      Pertinent History  osteoporosis, steroid injection into spine.  Pt is allergic to many soaps and wants to bring her own or wear gloves while here.    Patient Stated Goals  exercise for osteoporisis and cardiovascular health, reduce pain    Currently in Pain?  Yes    Pain Score  3     Pain Location  Back    Pain Orientation  Right;Left;Lower    Pain Descriptors / Indicators  Sore;Tightness;Constant    Pain Type  Chronic pain    Pain Onset  More than a month ago    Pain Frequency  Constant    Aggravating Factors   standing, walking, sitting    Pain Relieving Factors  medication, rest                      [facility] Adult PT Treatment/Exercise - 12/05/17 0001      Lumbar Exercises: Aerobic   Nustep  Level 2 x 8 minutes PT present to discuss progress with pt      Lumbar Exercises: Supine   Ab Set  20 reps;5 seconds    Pelvic Tilt Limitations  ab set with hip abduction      Moist Heat Therapy   Number Minutes Moist Heat  10 Minutes    Moist Heat Location  Lumbar Spine      Manual  Therapy   Manual Therapy  Soft tissue mobilization;Myofascial release       Trigger Point Dry Needling - 12/05/17 0949    Consent Given?  Yes    Education Handout Provided  Yes    Muscles Treated Lower Body  Gluteus minimus;Gluteus maximus lumbar multifidi    Gluteus Maximus Response  Twitch response elicited;Palpable increased muscle length    Gluteus Minimus Response  Twitch response elicited;Palpable increased muscle length           PT Education - 12/05/17 0950    Education provided  Yes    Education Details  DN info    Person(s) Educated  Patient    Methods  Explanation;Handout    Comprehension  Verbalized understanding;Returned demonstration       PT Short Term Goals - 12/05/17 0940      PT SHORT TERM GOAL #1   Title  be independent in initial HEP    Status  Achieved      PT SHORT TERM GOAL #2   Title  initiate a  walking program and complete 15-20 minutes of walking daily without limitation    Baseline  on treadmill- hasn't been doing it due to having a cold    Time  4    Period  Weeks    Status  On-going      PT SHORT TERM GOAL #4   Title  verbalize and demosntrate body mechanics modifications for house work and bed mobility to protect lumbar spine    Status  Achieved        PT Long Term Goals - 11/20/17 1048      PT LONG TERM GOAL #1   Title  be independent in advanced HEP    Time  8    Period  Weeks    Status  New    Target Date  01/15/18      PT LONG TERM GOAL #2   Title  report a 60% reduction in Rt LE pain with sitting, standing and walking    Time  8    Period  Weeks    Status  New    Target Date  01/15/18      PT LONG TERM GOAL #3   Title  stand for 45 minutes to an hour to improve community independence and endurance    Time  8    Period  Weeks    Status  New    Target Date  01/15/18      PT LONG TERM GOAL #4   Title  exercise > or = to 3x/wk without limitation due to pain    Time  8    Period  Weeks    Status  New    Target Date  01/15/18      PT LONG TERM GOAL #5   Title  reduce FOTO to < or = to 47% limitation    Time  8    Period  Weeks    Target Date  01/15/18            Plan - 12/05/17 0941    Clinical Impression Statement  Pt is independent in HEP for flexibility and core strength and is making body mechanics modifications.  Pt with stiffness and active trigger points in bil lumbar paraspinals and upper gluteals.  Pt demonstrated improved tissue mobility and reduced stiffness/pain after dry needling and manual therapy today.  Pt is able to demonstrate initial HEP for core strength.  Pt  will conitnue to benefit from skilled PT for core strength, decompression exercises, pain management and manual to reduce pain and improve mobility.      Rehab Potential  Good    PT Frequency  2x / week    PT Duration  8 weeks    PT Treatment/Interventions  ADLs/Self  Care Home Management;Cryotherapy;Electrical Stimulation;Ultrasound;Moist Heat;Gait training;Functional mobility training;Therapeutic activities;Therapeutic exercise;Patient/family education;Neuromuscular re-education;Manual techniques;Passive range of motion;Dry needling;Taping;Vasopneumatic Device;Traction    PT Next Visit Plan  assess response to dry needling, add decompression exercises and supine theraband    Consulted and Agree with Plan of Care  Patient       Patient will benefit from skilled therapeutic intervention in order to improve the following deficits and impairments:  Pain, Increased muscle spasms, Postural dysfunction, Decreased activity tolerance, Decreased range of motion, Decreased strength, Impaired UE functional use, Difficulty walking, Impaired flexibility  Visit Diagnosis: Chronic bilateral low back pain with right-sided sciatica  Cramp and spasm  Muscle weakness (generalized)     Problem List Patient Active Problem List   Diagnosis Date Noted  . Pain in right hip 08/15/2017  . Chronic right-sided low back pain with right-sided sciatica 08/15/2017  . Trochanteric bursitis of right hip 10/06/2014  . Essential hypertension, benign 05/14/2011  . Vitamin D deficiency 08/02/2010  . Allergic rhinitis 07/30/2010  . Coronary atherosclerosis 01/19/2009  . Hypothyroidism 12/04/2008  . Hyperlipidemia 12/04/2008  . Osteoporosis 12/04/2008    Sigurd Sos, PT 12/05/17 10:22 AM  [facility] [address] [place], [place], [address] Phone: [phone]   Fax:  [phone]  Name: DAIZY OUTEN MRN: [id number] Date of Birth: [birth year]

## 2017-12-07 ENCOUNTER — Ambulatory Visit: Payer: Medicare Other

## 2017-12-07 DIAGNOSIS — M5441 Lumbago with sciatica, right side: Secondary | ICD-10-CM | POA: Diagnosis not present

## 2017-12-07 DIAGNOSIS — R252 Cramp and spasm: Secondary | ICD-10-CM

## 2017-12-07 DIAGNOSIS — G8929 Other chronic pain: Secondary | ICD-10-CM

## 2017-12-07 DIAGNOSIS — M6281 Muscle weakness (generalized): Secondary | ICD-10-CM

## 2017-12-07 NOTE — Therapy (Signed)
[facility] [address] [place], [place], [address] Phone: [phone]   Fax:  [phone]  Physical Therapy Treatment  Patient Details  Name: Victoria [name] MRN: [id number] Date of Birth: 1941-06-12 Referring Provider: Erline Levine, MD   Encounter Date: 12/07/2017  PT End of Session - 12/07/17 1014    Visit Number  5    Date for PT Re-Evaluation  01/15/18    Authorization Type  [employer] Medicare    PT Start Time  0928    PT Stop Time  1013    PT Time Calculation (min)  45 min    Activity Tolerance  Patient tolerated treatment well    Behavior During Therapy  [facility] for tasks assessed/performed       Past Medical History:  Diagnosis Date  . Acute lumbar radiculopathy 04/2012  . Atopic eczema   . CAD (coronary artery disease)   . Essential hypertension, benign   . Hyperlipidemia, statin intolerant   . Hypothyroidism   . Myocardial infarction ([place]) 2009  . NSTEMI (non-ST elevated myocardial infarction) ([place]) 2009  . Osteoporosis, s/p bisphophonates x 7 years, stopped in 2011   . Sacrum and coccyx fracture [facility])     Past Surgical History:  Procedure Laterality Date  . APPENDECTOMY    . CARDIAC CATHETERIZATION  2009  . CARPAL TUNNEL RELEASE Bilateral   . HIP SURGERY  1980s  . LAPAROSCOPIC HYSTERECTOMY    . LEFT HEART CATHETERIZATION WITH CORONARY ANGIOGRAM N/A 11/18/2013   Procedure: LEFT HEART CATHETERIZATION WITH CORONARY ANGIOGRAM;  Surgeon: Blane Ohara, MD;  Location: [facility] CATH LAB;  Service: Cardiovascular;  Laterality: N/A;  . LUMBAR LAMINECTOMY/DECOMPRESSION MICRODISCECTOMY  04/17/2012   Procedure: LUMBAR LAMINECTOMY/DECOMPRESSION MICRODISCECTOMY 1 LEVEL;  Surgeon: Erline Levine, MD;  Location: [facility];  Service: Neurosurgery;  Laterality: Left;  Left Lumbar five-Sacral one Microdiskectomy  . TONSILLECTOMY      There were no vitals filed for this visit.  Subjective Assessment - 12/07/17 0928     Subjective  I was sore after dry needling but not feeling much change in my pain.     Pertinent History  osteoporosis, steroid injection into spine.  Pt is allergic to many soaps and wants to bring her own or wear gloves while here.    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  Yes    Pain Score  3     Pain Location  Back    Pain Orientation  Right;Left;Lower    Pain Descriptors / Indicators  Sore;Tightness;Constant    Pain Type  Chronic pain    Pain Onset  More than a month ago    Pain Frequency  Constant                      [facility] Adult PT Treatment/Exercise - 12/07/17 0001      Lumbar Exercises: Stretches   Active Hamstring Stretch  Left;Right;3 reps;20 seconds    Single Knee to Chest Stretch  Left;Right;3 reps;20 seconds    Lower Trunk Rotation  3 reps;20 seconds    Piriformis Stretch  Left;Right;3 reps;20 seconds    Figure 4 Stretch  3 reps;20 seconds;Seated      Lumbar Exercises: Aerobic   Nustep  Level 2 x 8 minutes PT present to discuss progress with pt  Lumbar Exercises: Supine   Clam  20 reps green band    Other Supine Lumbar Exercises  decompression x 3 minutes, scap squeezes in decompression x15      Knee/Hip Exercises: Standing   Heel Raises  Both;2 sets;10 reps    Hip Abduction  Stengthening;Both;2 sets;10 reps    Hip Extension  Stengthening;Both;2 sets;10 reps      Knee/Hip Exercises: Seated   Sit to Sand  20 reps;without UE support             PT Education - 12/07/17 0957    Education provided  Yes    Education Details  balance exercise/strength with core activation, decompression    Person(s) Educated  Patient    Methods  Explanation;Handout    Comprehension  Verbalized understanding;Returned demonstration       PT Short Term Goals - 12/05/17 0940      PT SHORT TERM GOAL #1   Title  be  independent in initial HEP    Status  Achieved      PT SHORT TERM GOAL #2   Title  initiate a walking program and complete 15-20 minutes of walking daily without limitation    Baseline  on treadmill- hasn't been doing it due to having a cold    Time  4    Period  Weeks    Status  On-going      PT SHORT TERM GOAL #4   Title  verbalize and demosntrate body mechanics modifications for house work and bed mobility to protect lumbar spine    Status  Achieved        PT Long Term Goals - 11/20/17 1048      PT LONG TERM GOAL #1   Title  be independent in advanced HEP    Time  8    Period  Weeks    Status  New    Target Date  01/15/18      PT LONG TERM GOAL #2   Title  report a 60% reduction in Rt LE pain with sitting, standing and walking    Time  8    Period  Weeks    Status  New    Target Date  01/15/18      PT LONG TERM GOAL #3   Title  stand for 45 minutes to an hour to improve community independence and endurance    Time  8    Period  Weeks    Status  New    Target Date  01/15/18      PT LONG TERM GOAL #4   Title  exercise > or = to 3x/wk without limitation due to pain    Time  8    Period  Weeks    Status  New    Target Date  01/15/18      PT LONG TERM GOAL #5   Title  reduce FOTO to < or = to 47% limitation    Time  8    Period  Weeks    Target Date  01/15/18            Plan - 12/07/17 0933    Clinical Impression Statement  Pt had dry needling last session and reports that she didn't notice any changes in pain.  Pt reports that she is feeling less steady lately so PT emphasized balance exercises that incorporates core activation and postural correction today.  Pt required tactile and demo cueing with exercise today.  Pt  will benefit from continued PT for strength, flexibility and manual therapy as needed for pain.      Rehab Potential  Good    PT Frequency  2x / week    PT Duration  8 weeks    PT Treatment/Interventions  ADLs/Self Care Home  Management;Cryotherapy;Electrical Stimulation;Ultrasound;Moist Heat;Gait training;Functional mobility training;Therapeutic activities;Therapeutic exercise;Patient/family education;Neuromuscular re-education;Manual techniques;Passive range of motion;Dry needling;Taping;Vasopneumatic Device;Traction    PT Next Visit Plan  review balance/core exercises.  Decompression and supine theraband    Consulted and Agree with Plan of Care  Patient       Patient will benefit from skilled therapeutic intervention in order to improve the following deficits and impairments:  Pain, Increased muscle spasms, Postural dysfunction, Decreased activity tolerance, Decreased range of motion, Decreased strength, Impaired UE functional use, Difficulty walking, Impaired flexibility  Visit Diagnosis: Chronic bilateral low back pain with right-sided sciatica  Cramp and spasm  Muscle weakness (generalized)     Problem List Patient Active Problem List   Diagnosis Date Noted  . Pain in right hip 08/15/2017  . Chronic right-sided low back pain with right-sided sciatica 08/15/2017  . Trochanteric bursitis of right hip 10/06/2014  . Essential hypertension, benign 05/14/2011  . Vitamin D deficiency 08/02/2010  . Allergic rhinitis 07/30/2010  . Coronary atherosclerosis 01/19/2009  . Hypothyroidism 12/04/2008  . Hyperlipidemia 12/04/2008  . Osteoporosis 12/04/2008     Sigurd Sos, PT 12/07/17 10:15 AM  [facility] [address] [place], [place], [address] Phone: [phone]   Fax:  [phone]  Name: Victoria [name] MRN: [id number] Date of Birth: Jan 11, 1941

## 2017-12-07 NOTE — Patient Instructions (Addendum)
  ABDUCTION: Standing (Active)   Stand, feet flat. Lift right leg out to side. Use _0__ lbs. Complete __10_ repetitions. Perform __2_ sessions per day.    EXTENSION: Standing (Active)  Stand, both feet flat. Draw right leg behind body as far as possible. Use 0___ lbs. Complete 10 repetitions. Perform __2_ sessions per day.  Copyright  VHI. All rights reserved.    Heel Raises    Stand with support.   With knees straight, raise heels off ground.  Repeat _10__ times. Do _2__ times a day.  Copyright  VHI. All rights reserved.   HIP / KNEE: Extension - Sit to Stand    Sitting, lean chest forward, raise hips up from surface. Straighten hips and knees. Weight bear equally on left and right sides. Backs of legs should not push off surface. _10__ reps per set, __2_ sets per day, ___ days per week Use assistive device as needed.  RE-ALIGNMENT ROUTINE EXERCISES-OSTEOPROROSIS BASIC FOR POSTURAL CORRECTION   RE-ALIGNMENT Tips BENEFITS: 1.It helps to re-align the curves of the back and improve standing posture. 2.It allows the back muscles to rest and strengthen in preparation for more activity. FREQUENCY: Daily, even after weeks, months and years of more advanced exercises. START: 1.All exercises start in the same position: lying on the back, arms resting on the supporting surface, palms up and slightly away from the body, backs of hands down, knees bent, feet flat. 2.The head, neck, arms, and legs are supported according to specific instructions of your therapist. Copyright  VHI. All rights reserved.    1. Decompression Exercise: Basic.   Takes compression off the vertebral bodies; increases tolerance for lying on the back; helps relieve back pain   Lie on back on firm surface, knees bent, feet flat, arms turned up, out to sides (~35 degrees). Head neck and arms supported as necessary. Time _5-15__ minutes. Surface: floor     2. Shoulder Press  Strengthens upper back  extensors and scapular retractors.   Press both shoulders down. Hold _2-3__ seconds. Repeat _5-10__ times. Surface: floor    [facility] [address] [place], [place] [address] Phone # [phone] Fax [phone]

## 2017-12-21 ENCOUNTER — Ambulatory Visit: Payer: Medicare Other

## 2017-12-21 DIAGNOSIS — M6281 Muscle weakness (generalized): Secondary | ICD-10-CM

## 2017-12-21 DIAGNOSIS — M5441 Lumbago with sciatica, right side: Principal | ICD-10-CM

## 2017-12-21 DIAGNOSIS — G8929 Other chronic pain: Secondary | ICD-10-CM

## 2017-12-21 DIAGNOSIS — R252 Cramp and spasm: Secondary | ICD-10-CM

## 2017-12-21 NOTE — Therapy (Signed)
[facility] [address], [place], [place], [address] Phone: [phone]   Fax:  [phone]  Physical Therapy Treatment  Patient Details  Name: Victoria [name] MRN: [id number] Date of Birth: [birth year] Referring Provider: Erline Levine, MD   Encounter Date: 12/21/2017  PT End of Session - 12/21/17 1059    Visit Number  6    Date for PT Re-Evaluation  01/15/18    Authorization Type  UHC Medicare    PT Start Time  1016    PT Stop Time  1057    PT Time Calculation (min)  41 min    Activity Tolerance  Patient tolerated treatment well    Behavior During Therapy  [facility] for tasks assessed/performed       Past Medical History:  Diagnosis Date  . Acute lumbar radiculopathy 04/2012  . Atopic eczema   . CAD (coronary artery disease)   . Essential hypertension, benign   . Hyperlipidemia, statin intolerant   . Hypothyroidism   . Myocardial infarction ([place]) 2009  . NSTEMI (non-ST elevated myocardial infarction) ([place]) 2009  . Osteoporosis, s/p bisphophonates x 7 years, stopped in 2011   . Sacrum and coccyx fracture [facility])     Past Surgical History:  Procedure Laterality Date  . APPENDECTOMY    . CARDIAC CATHETERIZATION  2009  . CARPAL TUNNEL RELEASE Bilateral   . HIP SURGERY  1980s  . LAPAROSCOPIC HYSTERECTOMY    . LEFT HEART CATHETERIZATION WITH CORONARY ANGIOGRAM N/A 11/18/2013   Procedure: LEFT HEART CATHETERIZATION WITH CORONARY ANGIOGRAM;  Surgeon: Blane Ohara, MD;  Location: [facility] CATH LAB;  Service: Cardiovascular;  Laterality: N/A;  . LUMBAR LAMINECTOMY/DECOMPRESSION MICRODISCECTOMY  04/17/2012   Procedure: LUMBAR LAMINECTOMY/DECOMPRESSION MICRODISCECTOMY 1 LEVEL;  Surgeon: Erline Levine, MD;  Location: [facility];  Service: Neurosurgery;  Laterality: Left;  Left Lumbar five-Sacral one Microdiskectomy  . TONSILLECTOMY      There were no vitals filed for this visit.  Subjective Assessment - 12/21/17 1018     Subjective  I have been sick so I haven't been able to do much exercise.  I got a 2nd shot in my spine and it helped much more than the 1st shot did.  I want to take a break.      Pertinent History  osteoporosis, steroid injection into spine.  Pt is allergic to many soaps and wants to bring her own or wear gloves while here.    Patient Stated Goals  exercise for osteoporisis and cardiovascular health, reduce pain    Currently in Pain?  Yes    Pain Score  1     Pain Location  Back    Pain Orientation  Right;Left;Lower    Pain Descriptors / Indicators  Sore;Tightness;Constant    Pain Type  Chronic pain    Pain Onset  More than a month ago    Pain Frequency  Constant    Aggravating Factors   standing, walking, sitting    Pain Relieving Factors  medication, rest                      [facility] Adult PT Treatment/Exercise - 12/21/17 0001      Lumbar Exercises: Stretches   Active Hamstring Stretch  Left;Right;3 reps;20 seconds    Single Knee to Chest Stretch  Left;Right;3 reps;20 seconds    Lower Trunk Rotation  3 reps;20 seconds    Piriformis Stretch  Left;Right;3 reps;20 seconds    Figure  4 Stretch  3 reps;20 seconds;Seated      Lumbar Exercises: Aerobic   Nustep  Level 2 x 8 minutes PT present to discuss progress with pt      Lumbar Exercises: Supine   Other Supine Lumbar Exercises  decompression x 3 minutes, scap squeezes in decompression x15    Other Supine Lumbar Exercises  supine: horizontal abduction, ER and flexion with red theraband 2x10      Knee/Hip Exercises: Standing   Heel Raises  Both;10 reps    Hip Abduction  Stengthening;Both;2 sets;10 reps    Hip Extension  Stengthening;Both;2 sets;10 reps             PT Education - 12/21/17 1042    Education provided  Yes    Education Details  supine theraband    Person(s) Educated  Patient    Methods  Explanation;Handout;Demonstration    Comprehension  Verbalized understanding;Returned demonstration        PT Short Term Goals - 12/21/17 1025      PT SHORT TERM GOAL #3   Title  reporta 30% reduction in Rt LE pain with sitting, standing and walking    Baseline  75%       PT SHORT TERM GOAL #4   Title  verbalize and demosntrate body mechanics modifications for house work and bed mobility to protect lumbar spine    Status  Achieved        PT Long Term Goals - 12/21/17 1027      PT LONG TERM GOAL #2   Title  report a 60% reduction in Rt LE pain with sitting, standing and walking    Status  Achieved            Plan - 12/21/17 1027    Clinical Impression Statement  Pt reports 75% overall improvement in symptoms since the start of care.  Pt would like to be placed on hold to work on HEP for strength and flexibility.  Pt will return 01/09/18 for reassessment.  Pt is able to demonstrate all HEP correctly after review today.  Pain is significantly reduced after treatment and 2nd steroid injection last week.  Pt will be reassessed after 2.5 weeks off from PT per her request.    Rehab Potential  Good    PT Frequency  2x / week    PT Duration  8 weeks    PT Treatment/Interventions  ADLs/Self Care Home Management;Cryotherapy;Electrical Stimulation;Ultrasound;Moist Heat;Gait training;Functional mobility training;Therapeutic activities;Therapeutic exercise;Patient/family education;Neuromuscular re-education;Manual techniques;Passive range of motion;Dry needling;Taping;Vasopneumatic Device;Traction    PT Next Visit Plan  reassessment 01/09/18.  Pt will work on exercises at home.      Consulted and Agree with Plan of Care  Patient       Patient will benefit from skilled therapeutic intervention in order to improve the following deficits and impairments:  Pain, Increased muscle spasms, Postural dysfunction, Decreased activity tolerance, Decreased range of motion, Decreased strength, Impaired UE functional use, Difficulty walking, Impaired flexibility  Visit Diagnosis: Chronic bilateral low back  pain with right-sided sciatica  Cramp and spasm  Muscle weakness (generalized)     Problem List Patient Active Problem List   Diagnosis Date Noted  . Pain in right hip 08/15/2017  . Chronic right-sided low back pain with right-sided sciatica 08/15/2017  . Trochanteric bursitis of right hip 10/06/2014  . Essential hypertension, benign 05/14/2011  . Vitamin D deficiency 08/02/2010  . Allergic rhinitis 07/30/2010  . Coronary atherosclerosis 01/19/2009  . Hypothyroidism 12/04/2008  .  Hyperlipidemia 12/04/2008  . Osteoporosis 12/04/2008     Sigurd Sos, PT 12/21/17 11:00 AM  [facility] [address] [place], [place], [address] Phone: [phone]   Fax:  [phone]  Name: Victoria [name] MRN: [id number] Date of Birth: 01-21-1941

## 2017-12-21 NOTE — Patient Instructions (Addendum)
  Over Head Pull: Narrow Grip       On back, knees bent, feet flat, band across thighs, elbows straight but relaxed. Pull hands apart (start). Keeping elbows straight, bring arms up and over head, hands toward floor. Keep pull steady on band. Hold momentarily. Return slowly, keeping pull steady, back to start. Repeat __10_ times. Band color _red____   Side Pull: Double Arm   On back, knees bent, feet flat. Arms perpendicular to body, shoulder level, elbows straight but relaxed. Pull arms out to sides, elbows straight. Resistance band comes across collarbones, hands toward floor. Hold momentarily. Slowly return to starting position. Repeat _10__ times. Band color _red____    Shoulder Rotation: Double Arm   On back, knees bent, feet flat, elbows tucked at sides, bent 90, hands palms up. Pull hands apart and down toward floor, keeping elbows near sides. Hold momentarily. Slowly return to starting position. Repeat _10__ times. Band color __red  [facility] [address] [place], [place] [address] Phone # [phone] Fax [phone]____

## 2018-01-02 ENCOUNTER — Encounter: Payer: Self-pay | Admitting: Family Medicine

## 2018-01-09 ENCOUNTER — Ambulatory Visit: Payer: Medicare Other | Attending: Family Medicine

## 2018-01-09 DIAGNOSIS — R252 Cramp and spasm: Secondary | ICD-10-CM | POA: Diagnosis present

## 2018-01-09 DIAGNOSIS — G8929 Other chronic pain: Secondary | ICD-10-CM | POA: Insufficient documentation

## 2018-01-09 DIAGNOSIS — M6281 Muscle weakness (generalized): Secondary | ICD-10-CM | POA: Diagnosis present

## 2018-01-09 DIAGNOSIS — M5441 Lumbago with sciatica, right side: Secondary | ICD-10-CM | POA: Insufficient documentation

## 2018-01-09 NOTE — Therapy (Signed)
[facility] [address], [place], [place], [address] Phone: [phone]   Fax:  [phone]  Physical Therapy Treatment  Patient Details  Name: Victoria [name] MRN: [id number] Date of Birth: 1941-06-14 Referring Provider: Erline Levine, MD   Encounter Date: 01/09/2018  PT End of Session - 01/09/18 1050    Visit Number  7    PT Start Time  5573    PT Stop Time  2202 pt was not able to perform supine exercise so ended session early    PT Time Calculation (min)  31 min    Activity Tolerance  Patient limited by pain    Behavior During Therapy  [facility] for tasks assessed/performed       Past Medical History:  Diagnosis Date  . Acute lumbar radiculopathy 04/2012  . Atopic eczema   . CAD (coronary artery disease)   . Essential hypertension, benign   . Hyperlipidemia, statin intolerant   . Hypothyroidism   . Myocardial infarction ([place]) 2009  . NSTEMI (non-ST elevated myocardial infarction) ([place]) 2009  . Osteoporosis, s/p bisphophonates x 7 years, stopped in 2011   . Sacrum and coccyx fracture [facility])     Past Surgical History:  Procedure Laterality Date  . APPENDECTOMY    . CARDIAC CATHETERIZATION  2009  . CARPAL TUNNEL RELEASE Bilateral   . HIP SURGERY  1980s  . LAPAROSCOPIC HYSTERECTOMY    . LEFT HEART CATHETERIZATION WITH CORONARY ANGIOGRAM N/A 11/18/2013   Procedure: LEFT HEART CATHETERIZATION WITH CORONARY ANGIOGRAM;  Surgeon: Blane Ohara, MD;  Location: [facility] CATH LAB;  Service: Cardiovascular;  Laterality: N/A;  . LUMBAR LAMINECTOMY/DECOMPRESSION MICRODISCECTOMY  04/17/2012   Procedure: LUMBAR LAMINECTOMY/DECOMPRESSION MICRODISCECTOMY 1 LEVEL;  Surgeon: Erline Levine, MD;  Location: [facility] NEURO ORS;  Service: Neurosurgery;  Laterality: Left;  Left Lumbar five-Sacral one Microdiskectomy  . TONSILLECTOMY      There were no vitals filed for this visit.  Subjective Assessment - 01/09/18 1022    Subjective  I was  feeling so much better but I decided that I didn't want to take the medication any further.  Once I stopped the medication the pain increased again.      Patient Stated Goals  exercise for osteoporisis and cardiovascular health, reduce pain    Currently in Pain?  Yes    Pain Score  2     Pain Location  Back    Pain Orientation  Right;Left;Lower    Pain Descriptors / Indicators  Sore;Tightness    Pain Type  Chronic pain    Pain Onset  More than a month ago    Pain Frequency  Constant    Aggravating Factors   standing, walking, sitting    Pain Relieving Factors  medication, rest         [facility] PT Assessment - 01/09/18 0001      Assessment   Medical Diagnosis  radiculopathy, lumbar region      Observation/Other Assessments   Focus on Therapeutic Outcomes (FOTO)   52% limitation                   [facility] Adult PT Treatment/Exercise - 01/09/18 0001      Lumbar Exercises: Stretches   Active Hamstring Stretch  Left;Right;3 reps;20 seconds    Single Knee to Chest Stretch  Left;Right;3 reps;20 seconds    Piriformis Stretch  Left;Right;3 reps;20 seconds    Figure 4 Stretch  3 reps;20 seconds;Seated  Lumbar Exercises: Aerobic   Nustep  Level 2 x 8 minutes PT present to discuss progress with pt      Lumbar Exercises: Supine   Other Supine Lumbar Exercises  decompression x 3 minutes, scap squeezes in decompression x15 verbal review of supine theraband exercises due to pain    Other Supine Lumbar Exercises  --      Knee/Hip Exercises: Standing   Heel Raises  Both;10 reps    Hip Abduction  Stengthening;Both;2 sets;10 reps    Hip Extension  Stengthening;Both;2 sets;10 reps               PT Short Term Goals - 12/21/17 1025      PT SHORT TERM GOAL #3   Title  reporta 30% reduction in Rt LE pain with sitting, standing and walking    Baseline  75%       PT SHORT TERM GOAL #4   Title  verbalize and demosntrate body mechanics modifications for house work and bed  mobility to protect lumbar spine    Status  Achieved        PT Long Term Goals - 01/09/18 1024      PT LONG TERM GOAL #1   Title  be independent in advanced HEP    Status  Achieved      PT LONG TERM GOAL #2   Title  report a 60% reduction in Rt LE pain with sitting, standing and walking    Baseline  no change in pain- pain worse at night    Status  Not Met      PT LONG TERM GOAL #3   Title  stand for 45 minutes to an hour to improve community independence and endurance    Status  Not Met      PT LONG TERM GOAL #4   Title  exercise > or = to 3x/wk without limitation due to pain    Status  Not Met      PT LONG TERM GOAL #5   Title  reduce FOTO to < or = to 47% limitation    Baseline  52%     Status  Partially Met            Plan - 01/09/18 1040    Clinical Impression Statement  Pt stopped taking her medication for pain and now reports that her pain has returned.  Pt would like to be discharged to HEP at this time.  Pt has HEP for core strength, LE flexibility and standing exercise for strength.  Pt reports LE pain mostly at night.  Pt will folow-up with MD to discuss further options.      PT Next Visit Plan  D/C PT    Consulted and Agree with Plan of Care  Patient       Patient will benefit from skilled therapeutic intervention in order to improve the following deficits and impairments:     Visit Diagnosis: Chronic bilateral low back pain with right-sided sciatica  Cramp and spasm  Muscle weakness (generalized)     Problem List Patient Active Problem List   Diagnosis Date Noted  . Pain in right hip 08/15/2017  . Chronic right-sided low back pain with right-sided sciatica 08/15/2017  . Trochanteric bursitis of right hip 10/06/2014  . Essential hypertension, benign 05/14/2011  . Vitamin D deficiency 08/02/2010  . Allergic rhinitis 07/30/2010  . Coronary atherosclerosis 01/19/2009  . Hypothyroidism 12/04/2008  . Hyperlipidemia 12/04/2008  . Osteoporosis  12/04/2008  PHYSICAL THERAPY DISCHARGE SUMMARY  Visits from Start of Care: 7  Current functional level related to goals / functional outcomes: Pt requested D/C to HEP.  She has gone off her medication and pain has returned.  She plans to discuss options with MD in ~1 month.     Remaining deficits: See above for current goal status and deficits.     Education / Equipment: HEP, posture/body mechanics, activity modifications Plan: Patient agrees to discharge.  Patient goals were partially met. Patient is being discharged due to the patient's request.  ?????        Sigurd Sos, PT 01/09/18 10:52 AM  [facility] [address], [place], [place], [address] Phone: [phone]   Fax:  [phone]  Name: Victoria [name] MRN: [id number] Date of Birth: 1940/11/16

## 2018-01-22 ENCOUNTER — Telehealth: Payer: Self-pay

## 2018-01-22 ENCOUNTER — Ambulatory Visit (INDEPENDENT_AMBULATORY_CARE_PROVIDER_SITE_OTHER): Payer: Medicare Other

## 2018-01-22 ENCOUNTER — Encounter: Payer: Self-pay | Admitting: Family Medicine

## 2018-01-22 ENCOUNTER — Ambulatory Visit: Payer: Medicare Other | Admitting: Family Medicine

## 2018-01-22 VITALS — BP 130/84 | HR 87 | Temp 98.6°F | Ht 63.5 in | Wt 146.0 lb

## 2018-01-22 DIAGNOSIS — Z889 Allergy status to unspecified drugs, medicaments and biological substances status: Secondary | ICD-10-CM

## 2018-01-22 DIAGNOSIS — R059 Cough, unspecified: Secondary | ICD-10-CM

## 2018-01-22 DIAGNOSIS — R05 Cough: Secondary | ICD-10-CM | POA: Diagnosis not present

## 2018-01-22 MED ORDER — BUDESONIDE-FORMOTEROL FUMARATE 160-4.5 MCG/ACT IN AERO: 2.0000 | INHALATION_SPRAY | Freq: Two times a day (BID) | RESPIRATORY_TRACT | 3 refills | Status: DC

## 2018-01-22 MED ORDER — BENZONATATE 200 MG PO CAPS: 200.0000 mg | ORAL_CAPSULE | Freq: Two times a day (BID) | ORAL | 0 refills | Status: DC | PRN

## 2018-01-22 NOTE — Telephone Encounter (Signed)
Copied from [place] [id number]. Topic: General - Other >> Jan 22, 2018  3:00 PM Carolyn Stare wrote:  Pt said she will not be having the shingles injection done at the office. She said the  office is holding the injections for her but she will have the injection done else where

## 2018-01-22 NOTE — Telephone Encounter (Signed)
Have taken off hold.

## 2018-01-22 NOTE — Progress Notes (Signed)
Victoria [name] is a 77 y.o. female is here for an acute visit.  History of Present Illness:   Cough  The current episode started 1 to 4 weeks ago. The problem has been gradually worsening. The problem occurs constantly. The cough is productive of sputum. Associated symptoms include chills. Associated symptoms comments: And facial pain for one week. . The treatment provided no relief.   There are no preventive care reminders to display for this patient.   Depression screen [facility] 2/9 07/05/2017 05/17/2016 05/17/2016  Decreased Interest 0 (No Data) 0  Down, Depressed, Hopeless 0 - 0  PHQ - 2 Score 0 - 0   PMHx, SurgHx, SocialHx, FamHx, Medications, and Allergies were reviewed in the Visit Navigator and updated as appropriate.   Patient Active Problem List   Diagnosis Date Noted  . Pain in right hip 08/15/2017  . Chronic right-sided low back pain with right-sided sciatica 08/15/2017  . Trochanteric bursitis of right hip 10/06/2014  . Essential hypertension, benign 05/14/2011  . Vitamin D deficiency 08/02/2010  . Allergic rhinitis 07/30/2010  . Coronary atherosclerosis 01/19/2009  . Hypothyroidism 12/04/2008  . Hyperlipidemia 12/04/2008  . Osteoporosis 12/04/2008   Social History   Tobacco Use  . Smoking status: Never Smoker  . Smokeless tobacco: Never Used  Substance Use Topics  . Alcohol use: Yes    Alcohol/week: 0.6 oz    Types: 1 Glasses of wine per week  . Drug use: No   Current Medications and Allergies:   Current Outpatient Medications:  .  amLODipine (NORVASC) 5 MG tablet, Take 1 tablet (5 mg total) by mouth daily., Disp: 90 tablet, Rfl: 3 .  aspirin 81 MG tablet, Take 81 mg by mouth daily.  , Disp: , Rfl:  .  atorvastatin (LIPITOR) 10 MG tablet, Take 1 tablet (10 mg total) by mouth once a week., Disp: 15 tablet, Rfl: 3 .  Cholecalciferol (VITAMIN D) 2000 UNITS CAPS, Take 1 capsule (2,000 Units total) by mouth daily., Disp: 30 capsule, Rfl:  .  clobetasol  (TEMOVATE) 0.05 % external solution, Apply 1 application topically daily as needed. to scalp., Disp: 50 mL, Rfl: 1 .  Coenzyme Q10 (CO Q-10) 300 MG CAPS, Take 300 mg by mouth daily. , Disp: , Rfl:  .  Desoximetasone (TOPICORT) 0.25 % ointment, Apply 1 application topically 2 (two) times daily as needed (itching.). Apply to hands., Disp: , Rfl:  .  EPINEPHrine (EPIPEN) 0.3 mg/0.3 mL SOAJ injection, Inject 0.3 mLs (0.3 mg total) into the muscle once., Disp: 1 Device, Rfl: 1 .  ezetimibe (ZETIA) 10 MG tablet, Take 1 tablet (10 mg total) by mouth daily., Disp: 90 tablet, Rfl: 3 .  levothyroxine (SYNTHROID, LEVOTHROID) 75 MCG tablet, Take 1 tablet (75 mcg total) by mouth daily., Disp: 90 tablet, Rfl: 3 .  Multiple Vitamins-Minerals (EYE VITAMINS) TABS, Take 1 tablet by mouth daily.  , Disp: , Rfl:  .  nitroGLYCERIN (NITROSTAT) 0.4 MG SL tablet, Place 1 tablet (0.4 mg total) under the tongue every 5 (five) minutes as needed. For chest pain, Disp: 25 tablet, Rfl: 11    Allergies  Allergen Reactions  . Shellfish Allergy Hives and Shortness Of Breath  . Codeine     headache  . Lanolin Itching  . Latex Other (See Comments)    Unknown allergy testing showed allergic  . Statins     Simvastatin, Lipitor (both caused muscle aches); Crestor 5 mg and 10 mg daily (memory impairment), Livalo (throat and  mouth itching), pravastatin 10 mg qd (memory)  . Cephalexin Itching and Rash  . Ciprofloxacin Rash  . Clindamycin/Lincomycin Rash  . Erythromycin Itching and Rash  . Penicillins Itching and Rash  . Propylene Glycol Itching and Rash  . Tetracycline Itching and Rash  . Tetracyclines & Related Itching and Rash   Review of Systems   Pertinent items are noted in the HPI. Otherwise, ROS is negative.  Vitals:   Vitals:   01/22/18 1302  BP: 130/84  Pulse: 87  Temp: 98.6 F (37 C)  TempSrc: Oral  SpO2: 97%  Weight: 146 lb (66.2 kg)  Height: 5' 3.5" (1.613 m)     Body mass index is 25.46 kg/m.    Physical Exam:   Physical Exam  Constitutional: She appears well-nourished.  HENT:  Head: Normocephalic and atraumatic.  Nose: Right sinus exhibits maxillary sinus tenderness. Left sinus exhibits maxillary sinus tenderness.  Eyes: Pupils are equal, round, and reactive to light. EOM are normal.  Neck: Normal range of motion. Neck supple.  Cardiovascular: Normal rate, regular rhythm, normal heart sounds and intact distal pulses.  Pulmonary/Chest: Effort normal. She has decreased breath sounds. She has no wheezes. She has no rhonchi. She has no rales.  Abdominal: Soft.  Skin: Skin is warm.  Psychiatric: She has a normal mood and affect. Her behavior is normal.  Nursing note and vitals reviewed.   Assessment and Plan:   Taronda was seen today for cough.  Diagnoses and all orders for this visit:  Cough Comments: Likely bronchitis and not bacterial.  Chest x-ray without overt findings.  Waiting for overread. Orders: -     benzonatate (TESSALON) 200 MG capsule; Take 1 capsule (200 mg total) by mouth 2 (two) times daily as needed for cough. -     DG Chest 2 View; Future -     budesonide-formoterol (SYMBICORT) 160-4.5 MCG/ACT inhaler; Inhale 2 puffs into the lungs 2 (two) times daily. -     Ambulatory referral to Infectious Disease  Multiple drug allergies Comments: Patient with multiple allergies.  Not sure what I can give her in the future if she does develop a bacterial respiratory infection.  Will ask infectious disease to help. Orders: -     Ambulatory referral to Infectious Disease   . Reviewed expectations re: course of current medical issues. . Discussed self-management of symptoms. . Outlined signs and symptoms indicating need for more acute intervention. . Patient verbalized understanding and all questions were answered. Marland Kitchen Health Maintenance issues including appropriate healthy diet, exercise, and smoking avoidance were discussed with patient. . See orders for this  visit as documented in the electronic medical record. . Patient received an After Visit Summary.  Briscoe Deutscher, DO [place], [place] 01/22/2018  No future appointments.

## 2018-01-22 NOTE — Telephone Encounter (Unsigned)
Copied from [place] [id number]. Topic: General - Other >> Jan 22, 2018  3:00 PM Carolyn Stare wrote:  Pt said she will not be having the shingles injection done at the office. She said the  office is holding the injections for her but she will have the injection done else where

## 2018-02-02 ENCOUNTER — Telehealth: Payer: Self-pay | Admitting: Family Medicine

## 2018-02-02 NOTE — Telephone Encounter (Signed)
This CT was ordered on 01/23/18, however it was not ordered correctly.  The CT was placed and set up under an incorrect department (in this instance, the "[facility]") which does not place things into our referral work que. Once the CT was brought to my attention, the error was corrected and the order was sent to [facility] for scheduling.The order was worked and scheduled within an hour of the original issue being seen. At this time, no further action is needed.

## 2018-02-02 NOTE — Telephone Encounter (Signed)
Awaiting Dr. Juleen China response from JoEllen's message.

## 2018-02-02 NOTE — Telephone Encounter (Signed)
See other message

## 2018-02-02 NOTE — Telephone Encounter (Signed)
The order was added on 01/23/18. Please find out why this has taken so long and call the patient with information - let her know that it was ordered by my team in a timely manner. EW

## 2018-02-02 NOTE — Telephone Encounter (Signed)
Just following up on this have you seen app?

## 2018-02-02 NOTE — Telephone Encounter (Signed)
Patient checking status, requesting to be scheduled asap. States she was told this was ordered on 01/23/18, has not heard anything, states this is not good care and expects a call today

## 2018-02-02 NOTE — Telephone Encounter (Signed)
See note.   Copied from [place] [id number]. Topic: Referral - Status >> Feb 01, 2018  9:04 AM Scherrie Gerlach wrote: Reason for CRM: pt following up on CT CHEST WO CONTRAST referral.  Pt states she prefers to go to the newest and most up to date facility, as what she told Dr Juleen China. Pt instructed to follow up by the dr if she did not hear anything.

## 2018-02-08 ENCOUNTER — Ambulatory Visit (INDEPENDENT_AMBULATORY_CARE_PROVIDER_SITE_OTHER)
Admission: RE | Admit: 2018-02-08 | Discharge: 2018-02-08 | Disposition: A | Discharge status: Home / Self Care | Payer: Medicare Other | Source: Ambulatory Visit | Attending: Family Medicine | Admitting: Family Medicine

## 2018-02-08 DIAGNOSIS — R05 Cough: Secondary | ICD-10-CM

## 2018-02-08 DIAGNOSIS — R059 Cough, unspecified: Secondary | ICD-10-CM

## 2018-02-13 ENCOUNTER — Telehealth: Payer: Self-pay

## 2018-02-13 ENCOUNTER — Ambulatory Visit: Payer: Medicare Other | Admitting: Internal Medicine

## 2018-02-13 NOTE — Telephone Encounter (Signed)
   [facility] Pre-operative Risk Assessment    Request for surgical clearance:  1. What type of surgery is being performed? Lumbar Foraminotomy   2. When is this surgery scheduled? 02-15-18  3. What type of clearance is required (medical clearance vs. Pharmacy clearance to hold med vs. Both)? Medical  4. Are there any medications that need to be held prior to surgery and how long? None Specified    5. Practice name and name of physician performing surgery? [facility], Dr.Joseph Vertell Limber   6. What is your office phone number [phone] ext.[phone]    7.   What is your office fax number [phone]  8.   Anesthesia type (None, local, MAC, general) ? None Specified    Mendel Ryder 02/13/2018, 3:58 PM  _________________________________________________________________   (provider comments below)

## 2018-02-14 NOTE — Telephone Encounter (Signed)
Spoke with Pt. Appointment made for 02/27/18 at 11 am with Dr. Jory Sims, NP for pre-op clearance.

## 2018-02-14 NOTE — Telephone Encounter (Signed)
I spoke with dr Melven Sartorius RN. The request for pre op clearance was sent yesterday. Her surgery is tomorrow. Today on her pre op anesthesia interview the patient complained of chest pain.     Primary Cardiologist: Dr Burt Knack  Chart reviewed as part of pre-operative protocol coverage. Jaidan Stachnik Eiben was last seen on 05/11/17 by Dr Burt Knack.  Due to her symptoms and time since her LOV, Joliana Claflin Nettle will require a follow-up visit for further pre-operative risk assessment.  Pre-op covering staff: - Please schedule appointment and call patient to inform them. - Please contact requesting surgeon's office via preferred method (i.e, phone, fax) to inform them of need for appointment prior to surgery.  Kerin Ransom, PA-C 02/14/2018, 3:39 PM

## 2018-02-15 ENCOUNTER — Telehealth (HOSPITAL_COMMUNITY): Payer: Self-pay

## 2018-02-15 ENCOUNTER — Telehealth: Payer: Self-pay | Admitting: Cardiovascular Disease

## 2018-02-15 DIAGNOSIS — Z01818 Encounter for other preprocedural examination: Secondary | ICD-10-CM

## 2018-02-15 DIAGNOSIS — R079 Chest pain, unspecified: Secondary | ICD-10-CM

## 2018-02-15 NOTE — Telephone Encounter (Signed)
Ms. Victoria [name] is very upset because her surgery today was cancelled because she was not cleared by Cardiology. She wants Dr. Burt Knack to review her chart to see if she can be cleared without being seen first. She is in horrible pain and the longer she waits for back surgery the worse her condition will get. She is to have a lumbar foraminotomy.

## 2018-02-15 NOTE — Telephone Encounter (Signed)
Nuclear tech spoke with patient and she now agrees to [employer].  Test ordered for scheduling.

## 2018-02-15 NOTE — Telephone Encounter (Signed)
Patient refuses lexiscan.  Reviewed instructions with patient and she agrees to exercise nuclear stress test.  Spoke with nuclear tech. She will call and discuss test with Ms. Braver prior to scheduling.

## 2018-02-15 NOTE — Telephone Encounter (Signed)
Reviewed her chart.  A stress test was recommended at the time of her office visit last year.  This should be completed before she undergoes back surgery because she has chronic anginal symptoms and known coronary artery disease.  Have recommended an exercise stress Myoview if she is able, otherwise could do a pharmacologic Myoview.

## 2018-02-15 NOTE — Telephone Encounter (Signed)
Victoria [name] contacted about doing a Lexiscan Stress  Test for surgical clearance. Back surgery to be done by Dr. Vertell Limber. Pt coming in on 02/20/2018 at 7:30 am. Instructions given and the pt stated that she understood. She was told to callback with any questions or concerns. S.Savvas Roper EMTP.

## 2018-02-15 NOTE — Telephone Encounter (Signed)
New Message:       Pt is calling to discuss a surgery that was scheduled

## 2018-02-20 ENCOUNTER — Ambulatory Visit: Payer: Medicare Other | Admitting: Internal Medicine

## 2018-02-20 ENCOUNTER — Ambulatory Visit (HOSPITAL_COMMUNITY): Payer: Medicare Other | Attending: Cardiovascular Disease

## 2018-02-20 DIAGNOSIS — Z01818 Encounter for other preprocedural examination: Secondary | ICD-10-CM | POA: Diagnosis not present

## 2018-02-20 DIAGNOSIS — R079 Chest pain, unspecified: Secondary | ICD-10-CM | POA: Insufficient documentation

## 2018-02-20 LAB — MYOCARDIAL PERFUSION IMAGING
CHL CUP NUCLEAR SDS: 2
CHL CUP NUCLEAR SRS: 10
LV dias vol: 56 mL (ref 46–106)
LVSYSVOL: 14 mL
Peak HR: 117 {beats}/min
RATE: 0.34
Rest HR: 79 {beats}/min
SSS: 12
TID: 0.7

## 2018-02-20 MED ORDER — TECHNETIUM TC 99M TETROFOSMIN IV KIT
10.4000 | PACK | Freq: Once | INTRAVENOUS | Status: AC | PRN
  Administered 2018-02-20: 10.4 via INTRAVENOUS
  Filled 2018-02-20: qty 11

## 2018-02-20 MED ORDER — REGADENOSON 0.4 MG/5ML IV SOLN
0.4000 mg | Freq: Once | INTRAVENOUS | Status: AC
  Administered 2018-02-20: 0.4 mg via INTRAVENOUS

## 2018-02-20 MED ORDER — TECHNETIUM TC 99M TETROFOSMIN IV KIT
32.3000 | PACK | Freq: Once | INTRAVENOUS | Status: AC | PRN
  Administered 2018-02-20: 32.3 via INTRAVENOUS
  Filled 2018-02-20: qty 33

## 2018-02-27 ENCOUNTER — Ambulatory Visit: Payer: Medicare Other | Admitting: Adult Health

## 2018-03-01 ENCOUNTER — Ambulatory Visit: Payer: Medicare Other | Admitting: Internal Medicine

## 2018-03-01 ENCOUNTER — Encounter: Payer: Self-pay | Admitting: Internal Medicine

## 2018-03-01 DIAGNOSIS — Z881 Allergy status to other antibiotic agents status: Secondary | ICD-10-CM | POA: Diagnosis not present

## 2018-03-01 NOTE — Progress Notes (Signed)
[facility]      Reason for Consult: multiple drug allergies    Referring Physician: Dr. Juleen China    Patient ID: Victoria [name], female    DOB: [birth year], 77 y.o.   MRN: [id number]  HPI:   She comes in for evaluation of drug allergies in case she needs antibiotics in the future.  History of the allergies discussed with her and her first allergy occurred as a child to pencillin.  Then she noted significant rash over much of her body associated with itching.  This eventually resolved and she has avoided penicillin since that time.  Rash was not c/w hives and not associated with throat swelling or SOB.  Other allergic reactions she is somewhat unsure of but typically described as itching without an associated rash, except for recent clindamycin she took for facial/preseptal cellulitis and did develop some rash around her face. Again no throat swelling, no hives.  She remembers a respiratory infection and getting erythromycin and cipro and again only itching, but diffuse itch.  She also is getting surgery by Dr. Vertell Limber for her back, which she also had about 5 years ago and presumably received vancomycin perioperative prophylaxis.     Previous record reviewed in Epic.   Past Medical History:  Diagnosis Date  . Acute lumbar radiculopathy 04/2012  . Atopic eczema   . CAD (coronary artery disease)   . Essential hypertension, benign   . Hyperlipidemia, statin intolerant   . Hypothyroidism   . Myocardial infarction ([place]) 2009  . NSTEMI (non-ST elevated myocardial infarction) ([place]) 2009  . Osteoporosis, s/p bisphophonates x 7 years, stopped in 2011   . Sacrum and coccyx fracture ([place])     Prior to Admission medications   Medication Sig Start Date End Date Taking? Authorizing Provider  amLODipine (NORVASC) 5 MG tablet Take 1 tablet (5 mg total) by mouth daily. 05/11/17  Yes [name], MD  aspirin 81 MG tablet Take 81 mg by mouth daily.     Yes [provider]  atorvastatin (LIPITOR) 10 MG tablet Take 1 tablet (10 mg total) by mouth once a week. 05/11/17  Yes [name], MD  benzonatate (TESSALON) 200 MG capsule Take 1 capsule (200 mg total) by mouth 2 (two) times daily as needed for cough. 01/22/18  Yes Briscoe Deutscher, DO  budesonide-formoterol (SYMBICORT) 160-4.5 MCG/ACT inhaler Inhale 2 puffs into the lungs 2 (two) times daily. 01/22/18  Yes Briscoe Deutscher, DO  Cholecalciferol (VITAMIN D) 2000 UNITS CAPS Take 1 capsule (2,000 Units total) by mouth daily. 01/21/14  Yes [name], MD  clobetasol (TEMOVATE) 0.05 % external solution Apply 1 application topically daily as needed. to scalp. 01/23/14  Yes Rowe Clack, MD  Coenzyme Q10 (CO Q-10) 300 MG CAPS Take 300 mg by mouth daily.    Yes [provider]  Desoximetasone (TOPICORT) 0.25 % ointment Apply 1 application topically 2 (two) times daily as needed (itching.). Apply to hands.   Yes [provider]  EPINEPHrine (EPIPEN) 0.3 mg/0.3 mL SOAJ injection Inject 0.3 mLs (0.3 mg total) into the muscle once. 01/23/14  Yes Rowe Clack, MD  ezetimibe (ZETIA) 10 MG tablet Take 1 tablet (10 mg total) by mouth daily. 05/11/17  Yes [name], MD  levothyroxine (SYNTHROID, LEVOTHROID) 75 MCG tablet Take 1 tablet (75 mcg total) by mouth daily. 04/14/17  Yes Kordsmeier, Gregary Signs, FNP  Multiple Vitamins-Minerals (EYE VITAMINS) TABS Take 1 tablet by mouth daily.  Yes [provider]  nitroGLYCERIN (NITROSTAT) 0.4 MG SL tablet Place 1 tablet (0.4 mg total) under the tongue every 5 (five) minutes as needed. For chest pain 05/11/17  Yes [name], MD    Allergies  Allergen Reactions  . Shellfish Allergy Hives and Shortness Of Breath  . Codeine     headache  . Fish-Derived Products   . Lanolin Itching  . Latex Other (See Comments)    Unknown allergy testing showed allergic  . Pollen Extract   . Statins     Simvastatin, Lipitor (both caused muscle  aches); Crestor 5 mg and 10 mg daily (memory impairment), Livalo (throat and mouth itching), pravastatin 10 mg qd (memory)  . Cephalexin Itching and Rash  . Ciprofloxacin Rash  . Clindamycin/Lincomycin Rash  . Erythromycin Itching and Rash  . Penicillins Itching and Rash  . Propylene Glycol Itching and Rash  . Tetracycline Itching and Rash  . Tetracyclines & Related Itching and Rash    Social History   Tobacco Use  . Smoking status: Never Smoker  . Smokeless tobacco: Never Used  Substance Use Topics  . Alcohol use: Yes    Alcohol/week: 0.6 oz    Types: 1 Glasses of wine per week  . Drug use: No    Family History  Problem Relation Age of Onset  . Colon cancer Mother 20       1st degree  . Stroke Father 59  . Stroke Unknown        Grandfather- grandmother     Vitals:   03/01/18 1015  BP: 113/74  Pulse: 89  Temp: 97.8 F (36.6 C)    Labs: Lab Results  Component Value Date   WBC 7.5 04/13/2017   HGB 13.2 04/13/2017   HCT 38.7 04/13/2017   MCV 92.5 04/13/2017   PLT 249.0 04/13/2017    Lab Results  Component Value Date   CREATININE 0.76 06/08/2017   BUN 12 06/08/2017   NA 141 06/08/2017   K 4.5 06/08/2017   CL 106 06/08/2017   CO2 23 06/08/2017    Lab Results  Component Value Date   ALT 19 06/08/2017   AST 22 06/08/2017   ALKPHOS 106 06/08/2017   BILITOT 0.4 06/08/2017   INR 1.1 (H) 11/14/2013     Assessment: Low risk antibiotic allergy.  No concerning signs of significant reaction with no hives, no throat swelling. Though still considered an allergy, I think it is reasonable to consider using non-penicillin antibiotics with premedication with Benadryl or equivalent and consider observing her after the first dose in clinic.     Additionally, I discussed with her strategies to limit antibiotic exposure, which is optimal for anyone, regardless of allergy history.  I discussed getting necessary vaccines such as influenza, pneumonia, shingles.  I discussed  short durations, for example 3-5 days for pneumonia rather than typical prolonged duration and only with typical bacterial symptoms such as high fever, hypoxia, productive cough, SOB and short of that, antibiotics are considered unnecessary.  I also discussed durations being unnecessary once you have improved, no need to 'complete' the course.     For her surgical prophylaxis, vancomycin will be a good option.    Plan: 1) benadryl one hour prior to an antibiotic Stop antibiotic if you develop hives; go to ED/call 911 for throat swelling, SOB

## 2018-03-03 HISTORY — PX: SPINE SURGERY: SHX786

## 2018-04-12 ENCOUNTER — Encounter: Payer: Self-pay | Admitting: Physician Assistant

## 2018-04-30 NOTE — Progress Notes (Signed)
Cardiology Office Note    Date:  05/01/2018   ID:  [name], DOB 04-Sep-1941, MRN [id number]  PCP:  Briscoe Deutscher, DO  Cardiologist: [name], MD  Chief Complaint  Patient presents with  . Follow-up    History of Present Illness:  Victoria [name] is a 77 y.o. female with history of CAD status post NSTEMI 2009 treated with balloon angioplasty of a small circumflex branch and noted to have small vessel disease.  Cardiac cath 2016 for progressive angina demonstrated moderate diffuse mid LAD stenosis FFR analysis was borderline and she was treated medically.  Patient also has hypercholesterolemia  Patient last saw Dr. Burt Knack 05/2017.  She has chronic intermittent chest pain relieved with aspirin.  Chest pain is not related to physical activity but she was having some dyspnea on exertion.  He recommended an exercise stress echo to evaluate her ischemic burden and exercise capacity.  She has multiple medicine intolerances he did not think she would tolerate aggressive antianginal program.  Patient never had a stress echo done.  Nuclear stress test 02/20/18 for surgery.  LVEF 75% no ischemia. Had back surgery in June and is still recovering. Exercising on treadmill 10 min/day.  No significant chest pain, palpitations, dizziness or presyncope.  Had a CT of her chest 02/08/2018 that showed aortic atherosclerosis, ectatic ascending aorta and extensive coronary artery calcification.  I reassured her that her stress test was normal in May as well.     Past Medical History:  Diagnosis Date  . Acute lumbar radiculopathy 04/2012  . Atopic eczema   . CAD (coronary artery disease)   . Essential hypertension, benign   . Hyperlipidemia, statin intolerant   . Hypothyroidism   . Myocardial infarction ([place]) 2009  . NSTEMI (non-ST elevated myocardial infarction) ([place]) 2009  . Osteoporosis, s/p bisphophonates x 7 years, stopped in 2011   . Sacrum and coccyx fracture [facility])     Past  Surgical History:  Procedure Laterality Date  . APPENDECTOMY    . CARDIAC CATHETERIZATION  2009  . CARPAL TUNNEL RELEASE Bilateral   . HIP SURGERY  1980s  . LAPAROSCOPIC HYSTERECTOMY    . LEFT HEART CATHETERIZATION WITH CORONARY ANGIOGRAM N/A 11/18/2013   Procedure: LEFT HEART CATHETERIZATION WITH CORONARY ANGIOGRAM;  Surgeon: Blane Ohara, MD;  Location[facility] CATH LAB;  Service: Cardiovascular;  Laterality: N/A;  . LUMBAR LAMINECTOMY/DECOMPRESSION MICRODISCECTOMY  04/17/2012   Procedure: LUMBAR LAMINECTOMY/DECOMPRESSION MICRODISCECTOMY 1 LEVEL;  Surgeon: Erline Levine, MD;  Location: [facility] NEURO ORS;  Service: Neurosurgery;  Laterality: Left;  Left Lumbar five-Sacral one Microdiskectomy  . TONSILLECTOMY      Current Medications: Current Meds  Medication Sig  . amLODipine (NORVASC) 5 MG tablet Take 1 tablet (5 mg total) by mouth daily.  Marland Kitchen aspirin 81 MG tablet Take 81 mg by mouth daily.    Marland Kitchen atorvastatin (LIPITOR) 10 MG tablet Take 1 tablet (10 mg total) by mouth once a week.  . benzonatate (TESSALON) 200 MG capsule Take 1 capsule (200 mg total) by mouth 2 (two) times daily as needed for cough.  . budesonide-formoterol (SYMBICORT) 160-4.5 MCG/ACT inhaler Inhale 2 puffs into the lungs 2 (two) times daily.  . Cholecalciferol (VITAMIN D) 2000 UNITS CAPS Take 1 capsule (2,000 Units total) by mouth daily.  . clobetasol (TEMOVATE) 0.05 % external solution Apply 1 application topically daily as needed. to scalp.  . Coenzyme Q10 (CO Q-10) 300 MG CAPS Take 300 mg by mouth daily.   . Desoximetasone (  TOPICORT) 0.25 % ointment Apply 1 application topically 2 (two) times daily as needed (itching.). Apply to hands.  Marland Kitchen EPINEPHrine (EPIPEN) 0.3 mg/0.3 mL SOAJ injection Inject 0.3 mLs (0.3 mg total) into the muscle once.  . ezetimibe (ZETIA) 10 MG tablet Take 1 tablet (10 mg total) by mouth daily.  Marland Kitchen HYDROcodone-acetaminophen (NORCO/VICODIN) 5-325 MG tablet Take 1 tablet by mouth every 4 (four) hours as needed  for pain. for pain  . levothyroxine (SYNTHROID, LEVOTHROID) 75 MCG tablet Take 1 tablet (75 mcg total) by mouth daily.  . Multiple Vitamins-Minerals (EYE VITAMINS) TABS Take 1 tablet by mouth daily.    . nitroGLYCERIN (NITROSTAT) 0.4 MG SL tablet Place 1 tablet (0.4 mg total) under the tongue every 5 (five) minutes as needed. For chest pain  . [DISCONTINUED] amLODipine (NORVASC) 5 MG tablet Take 1 tablet (5 mg total) by mouth daily.  . [DISCONTINUED] atorvastatin (LIPITOR) 10 MG tablet Take 1 tablet (10 mg total) by mouth once a week.  . [DISCONTINUED] ezetimibe (ZETIA) 10 MG tablet Take 1 tablet (10 mg total) by mouth daily.     Allergies:   Shellfish allergy; Codeine; Fish-derived products; Lanolin; Latex; Pollen extract; Statins; Cephalexin; Ciprofloxacin; Clindamycin/lincomycin; Erythromycin; Penicillins; Propylene glycol; Tetracycline; and Tetracyclines & related   Social History   Socioeconomic History  . Marital status: Married    Spouse name: Not on file  . Number of children: 2  . Years of education: Not on file  . Highest education level: Not on file  Occupational History  . Occupation: retired  Scientific laboratory technician  . Financial resource strain: Not on file  . Food insecurity:    Worry: Not on file    Inability: Not on file  . Transportation needs:    Medical: Not on file    Non-medical: Not on file  Tobacco Use  . Smoking status: Never Smoker  . Smokeless tobacco: Never Used  Substance and Sexual Activity  . Alcohol use: Yes    Alcohol/week: 0.6 oz    Types: 1 Glasses of wine per week  . Drug use: No  . Sexual activity: Not on file  Lifestyle  . Physical activity:    Days per week: Not on file    Minutes per session: Not on file  . Stress: Not on file  Relationships  . Social connections:    Talks on phone: Not on file    Gets together: Not on file    Attends religious service: Not on file    Active member of club or organization: Not on file    Attends meetings of  clubs or organizations: Not on file    Relationship status: Not on file  Other Topics Concern  . Not on file  Social History Narrative  . Not on file     Family History:  The patient's family history includes Colon cancer (age of onset: 31) in her mother; Stroke in her unknown relative; Stroke (age of onset: 74) in her father.   ROS:   Please see the history of present illness.    Review of Systems  Constitution: Negative.  HENT: Negative.   Eyes: Negative.   Cardiovascular: Negative.   Respiratory: Negative.   Hematologic/Lymphatic: Negative.   Musculoskeletal: Positive for back pain. Negative for joint pain.  Gastrointestinal: Negative.   Genitourinary: Negative.   Neurological: Positive for loss of balance.  Psychiatric/Behavioral: The patient is nervous/anxious.    All other systems reviewed and are negative.   PHYSICAL EXAM:  VS:  BP 138/72   Pulse 84   Ht 5' 3.5" (1.613 m)   Wt 149 lb (67.6 kg)   BMI 25.98 kg/m   Physical Exam  GEN: Well nourished, well developed, in no acute distress  Neck: no JVD, carotid bruits, or masses Cardiac:RRR; no murmurs, rubs, or gallops  Respiratory:  clear to auscultation bilaterally, normal work of breathing GI: soft, nontender, nondistended, + BS Ext: without cyanosis, clubbing, or edema, Good distal pulses bilaterally Neuro:  Alert and Oriented x 3 Psych: euthymic mood, full affect  Wt Readings from Last 3 Encounters:  05/01/18 149 lb (67.6 kg)  03/01/18 147 lb (66.7 kg)  02/20/18 146 lb (66.2 kg)      Studies/Labs Reviewed:   EKG:  EKG is  ordered today.  The ekg ordered today demonstrates normal sinus rhythm, left anterior fascicular block, inferior Q waves, nonspecific ST-T wave changes, no acute change compared to EKG 05/2017  Recent Labs: 06/08/2017: ALT 19; BUN 12; Creatinine, Ser 0.76; Potassium 4.5; Sodium 141 07/05/2017: TSH 0.88   Lipid Panel    Component Value Date/Time   CHOL 152 06/08/2017 0737   TRIG 86  06/08/2017 0737   HDL 46 06/08/2017 0737   CHOLHDL 3.3 06/08/2017 0737   CHOLHDL 3.4 10/06/2015 0753   VLDL 25 10/06/2015 0753   LDLCALC 89 06/08/2017 0737    Additional studies/ records that were reviewed today include:  Nuclear stress test 02/20/2018   Nuclear stress EF: 75%.  There was no ST segment deviation noted during stress.  The left ventricular ejection fraction is hyperdynamic (>65%).   Poor quality study with diffusely low counts in apex and inferior wall on all images No evidence of ischemia EF 75%   CT of the chest 5/2019IMPRESSION: The study does not show a lung lesion. Findings at chest radiography or presumably secondary to summation shadows.   Aortic atherosclerosis. Ectatic ascending aorta. Extensive coronary artery calcification.     ASSESSMENT:    1. Atherosclerosis of native coronary artery of native heart, angina presence unspecified   2. Mixed hyperlipidemia   3. Essential hypertension   4. Hypothyroidism, unspecified type      PLAN:  In order of problems listed above:  CAD status post NSTEMI 2009 treated with balloon angioplasty of small circumflex branch.  Cardiac cath 2016 moderate diffuse mid LAD stenosis FFR was borderline she was treated medically.  CT scan 01/2018 extensive coronary calcification.  Nuclear stress test 01/2018 no ischemia normal LVEF. patient has history of chronic chest but not complaining of today.  Follow-up with Dr. Burt Knack in 1 year.  Essential hypertension blood pressure stable  Mixed hyperlipidemia LDL 89 last year.  Due for repeat labs.  We will have her come back for fasting lipid panel and surveillance labs.  Hypothyroidism patient's asking for labs to be checked with her other labs so she does not have to be stuck again by PCP.  Medication Adjustments/Labs and Tests Ordered: Current medicines are reviewed at length with the patient today.  Concerns regarding medicines are outlined above.  Medication changes,  Labs and Tests ordered today are listed in the Patient Instructions below. Patient Instructions  Medication Instructions: Your physician recommends that you continue on your current medications as directed. Please refer to the Current Medication list given to you today.   Labwork: FUTURE: FASTING LIPIDS, CBC, CMET, TSH & FREE T4  Procedures/Testing: None Ordered  Follow-Up: Your physician wants you to follow-up in: 1 year with Dr.Cooper You  will receive a reminder letter in the mail two months in advance. If you don't receive a letter, please call our office to schedule the follow-up appointment.   Any Additional Special Instructions Will Be Listed Below (If Applicable).     If you need a refill on your cardiac medications before your next appointment, please call your pharmacy.      Sumner Boast, PA-C  05/01/2018 10:28 AM    [facility] [place], [place], [place]  [address] Phone: [phone]; Fax: [phone]

## 2018-05-01 ENCOUNTER — Ambulatory Visit: Payer: Medicare Other | Admitting: Physician Assistant

## 2018-05-01 ENCOUNTER — Encounter: Payer: Self-pay | Admitting: Physician Assistant

## 2018-05-01 VITALS — BP 138/72 | HR 84 | Ht 63.5 in | Wt 149.0 lb

## 2018-05-01 DIAGNOSIS — I10 Essential (primary) hypertension: Secondary | ICD-10-CM | POA: Diagnosis not present

## 2018-05-01 DIAGNOSIS — I2510 Atherosclerotic heart disease of native coronary artery without angina pectoris: Secondary | ICD-10-CM | POA: Diagnosis not present

## 2018-05-01 DIAGNOSIS — E039 Hypothyroidism, unspecified: Secondary | ICD-10-CM | POA: Diagnosis not present

## 2018-05-01 DIAGNOSIS — E782 Mixed hyperlipidemia: Secondary | ICD-10-CM | POA: Diagnosis not present

## 2018-05-01 MED ORDER — AMLODIPINE BESYLATE 5 MG PO TABS: 5.0000 mg | ORAL_TABLET | Freq: Every day | ORAL | 3 refills | Status: DC

## 2018-05-01 MED ORDER — EZETIMIBE 10 MG PO TABS: 10.0000 mg | ORAL_TABLET | Freq: Every day | ORAL | 3 refills | Status: DC

## 2018-05-01 MED ORDER — ATORVASTATIN CALCIUM 10 MG PO TABS: 10.0000 mg | ORAL_TABLET | ORAL | 3 refills | Status: DC

## 2018-05-01 NOTE — Patient Instructions (Addendum)
Medication Instructions: Your physician recommends that you continue on your current medications as directed. Please refer to the Current Medication list given to you today.   Labwork: FUTURE: FASTING LIPIDS, CBC, CMET, TSH & FREE T4  Procedures/Testing: None Ordered  Follow-Up: Your physician wants you to follow-up in: 1 year with Dr.Cooper You will receive a reminder letter in the mail two months in advance. If you don't receive a letter, please call our office to schedule the follow-up appointment.   Any Additional Special Instructions Will Be Listed Below (If Applicable).     If you need a refill on your cardiac medications before your next appointment, please call your pharmacy.

## 2018-05-03 ENCOUNTER — Other Ambulatory Visit: Payer: Medicare Other | Admitting: *Deleted

## 2018-05-03 DIAGNOSIS — I10 Essential (primary) hypertension: Secondary | ICD-10-CM

## 2018-05-03 DIAGNOSIS — I2510 Atherosclerotic heart disease of native coronary artery without angina pectoris: Secondary | ICD-10-CM

## 2018-05-03 DIAGNOSIS — E782 Mixed hyperlipidemia: Secondary | ICD-10-CM

## 2018-05-03 DIAGNOSIS — E039 Hypothyroidism, unspecified: Secondary | ICD-10-CM

## 2018-05-03 LAB — COMPREHENSIVE METABOLIC PANEL
A/G RATIO: 1.6 (ref 1.2–2.2)
ALBUMIN: 4.3 g/dL (ref 3.5–4.8)
ALT: 21 IU/L (ref 0–32)
AST: 19 IU/L (ref 0–40)
Alkaline Phosphatase: 107 IU/L (ref 39–117)
BILIRUBIN TOTAL: 0.5 mg/dL (ref 0.0–1.2)
BUN/Creatinine Ratio: 17 (ref 12–28)
BUN: 14 mg/dL (ref 8–27)
CALCIUM: 10.7 mg/dL — AB (ref 8.7–10.3)
CHLORIDE: 103 mmol/L (ref 96–106)
CO2: 22 mmol/L (ref 20–29)
Creatinine, Ser: 0.81 mg/dL (ref 0.57–1.00)
GFR, EST AFRICAN AMERICAN: 81 mL/min/{1.73_m2} (ref >59)
GFR, EST NON AFRICAN AMERICAN: 70 mL/min/{1.73_m2} (ref >59)
GLOBULIN, TOTAL: 2.7 g/dL (ref 1.5–4.5)
Glucose: 79 mg/dL (ref 65–99)
POTASSIUM: 4.2 mmol/L (ref 3.5–5.2)
SODIUM: 141 mmol/L (ref 134–144)
TOTAL PROTEIN: 7 g/dL (ref 6.0–8.5)

## 2018-05-03 LAB — T4, FREE: Free T4: 1.41 ng/dL (ref 0.82–1.77)

## 2018-05-03 LAB — LIPID PANEL
CHOL/HDL RATIO: 2.9 ratio (ref 0.0–4.4)
Cholesterol, Total: 171 mg/dL (ref 100–199)
HDL: 59 mg/dL (ref >39)
LDL Calculated: 93 mg/dL (ref 0–99)
Triglycerides: 93 mg/dL (ref 0–149)
VLDL Cholesterol Cal: 19 mg/dL (ref 5–40)

## 2018-05-03 LAB — TSH: TSH: 2.04 u[IU]/mL (ref 0.450–4.500)

## 2018-05-03 LAB — CBC
HEMOGLOBIN: 13.1 g/dL (ref 11.1–15.9)
Hematocrit: 38.8 % (ref 34.0–46.6)
MCH: 30.9 pg (ref 26.6–33.0)
MCHC: 33.8 g/dL (ref 31.5–35.7)
MCV: 92 fL (ref 79–97)
PLATELETS: 264 10*3/uL (ref 150–450)
RBC: 4.24 x10E6/uL (ref 3.77–5.28)
RDW: 13.7 % (ref 12.3–15.4)
WBC: 6.5 10*3/uL (ref 3.4–10.8)

## 2018-05-08 ENCOUNTER — Telehealth: Payer: Self-pay | Admitting: Physician Assistant

## 2018-05-08 ENCOUNTER — Telehealth: Payer: Self-pay | Admitting: *Deleted

## 2018-05-08 MED ORDER — ATORVASTATIN CALCIUM 10 MG PO TABS: ORAL_TABLET | ORAL | 3 refills | Status: DC

## 2018-05-08 NOTE — Telephone Encounter (Signed)
Follow Up:    Returning Victoria [name]'s phone call from 05-04-18, concerning her lab results.

## 2018-05-08 NOTE — Telephone Encounter (Signed)
-----   Message from Imogene Burn, PA-C sent at 05/04/2018  7:54 AM EDT ----- Labs all look great including normal thyroid. Copy sent to Dr. Juleen China. LDL 93 which is above goal. Make sure she is taking Lipitor more than once a week. Daily if she can tolerate or even 3 days/week.

## 2018-05-08 NOTE — Telephone Encounter (Signed)
error 

## 2018-05-10 NOTE — Progress Notes (Signed)
Victoria [name] is a 77 y.o. female is here for follow up.  History of Present Illness:   Lonell Grandchild, CMA acting as scribe for Dr. Briscoe Deutscher.   HPI: Patient in office to day for follow up. She wanted to know that she loves coming here that the whole office radiates such a positive atitude.   She has seen infection control about options that she could use for antibiotics. She was very pleased with the outcome. She was informed that she can take some of the older antibiotics with an antihistamine to avoid reaction.   Osteoporosis: she has reviewed a lot of information on the mediations and is very intimidated by the side effects. She would like to work on diet and exercise options. She has been instructed to work on exercise. She was interested in getting the information to the [facility]. That will be provided to patient today. She will make sure that she is taking an additional vitamin D and Calcium supplement and provided patient with some diet information today as well.   Results for orders placed or performed in visit on 05/03/18  CBC  Result Value Ref Range   WBC 6.5 3.4 - 10.8 x10E3/uL   RBC 4.24 3.77 - 5.28 x10E6/uL   Hemoglobin 13.1 11.1 - 15.9 g/dL   Hematocrit 38.8 34.0 - 46.6 %   MCV 92 79 - 97 fL   MCH 30.9 26.6 - 33.0 pg   MCHC 33.8 31.5 - 35.7 g/dL   RDW 13.7 12.3 - 15.4 %   Platelets 264 150 - 450 x10E3/uL  Comp Met (CMET)  Result Value Ref Range   Glucose 79 65 - 99 mg/dL   BUN 14 8 - 27 mg/dL   Creatinine, Ser 0.81 0.57 - 1.00 mg/dL   GFR calc non Af Amer 70 >59 mL/min/1.73   GFR calc Af Amer 81 >59 mL/min/1.73   BUN/Creatinine Ratio 17 12 - 28   Sodium 141 134 - 144 mmol/L   Potassium 4.2 3.5 - 5.2 mmol/L   Chloride 103 96 - 106 mmol/L   CO2 22 20 - 29 mmol/L   Calcium 10.7 (H) 8.7 - 10.3 mg/dL   Total Protein 7.0 6.0 - 8.5 g/dL   Albumin 4.3 3.5 - 4.8 g/dL   Globulin, Total 2.7 1.5 - 4.5 g/dL   Albumin/Globulin Ratio 1.6 1.2 - 2.2   Bilirubin Total 0.5 0.0 - 1.2 mg/dL   Alkaline Phosphatase 107 39 - 117 IU/L   AST 19 0 - 40 IU/L   ALT 21 0 - 32 IU/L  Lipid panel  Result Value Ref Range   Cholesterol, Total 171 100 - 199 mg/dL   Triglycerides 93 0 - 149 mg/dL   HDL 59 >39 mg/dL   VLDL Cholesterol Cal 19 5 - 40 mg/dL   LDL Calculated 93 0 - 99 mg/dL   Chol/HDL Ratio 2.9 0.0 - 4.4 ratio  TSH  Result Value Ref Range   TSH 2.040 0.450 - 4.500 uIU/mL  T4, free  Result Value Ref Range   Free T4 1.41 0.82 - 1.77 ng/dL   Health Maintenance Due  Topic Date Due  . INFLUENZA VACCINE  05/03/2018   Depression screen [facility] 2/9 03/01/2018 07/05/2017 05/17/2016  Decreased Interest 0 0 (No Data)  Down, Depressed, Hopeless 0 0 -  PHQ - 2 Score 0 0 -   PMHx, SurgHx, SocialHx, FamHx, Medications, and Allergies were reviewed in the Visit Navigator and updated as appropriate.  Patient Active Problem List   Diagnosis Date Noted  . Drug allergy, antibiotic 03/01/2018  . Pain in right hip 08/15/2017  . Chronic right-sided low back pain with right-sided sciatica 08/15/2017  . Trochanteric bursitis of right hip 10/06/2014  . Essential hypertension, benign 05/14/2011  . Vitamin D deficiency 08/02/2010  . Allergic rhinitis 07/30/2010  . Coronary atherosclerosis 01/19/2009  . Hypothyroidism 12/04/2008  . Hyperlipidemia 12/04/2008  . Osteoporosis 12/04/2008   Social History   Tobacco Use  . Smoking status: Never Smoker  . Smokeless tobacco: Never Used  Substance Use Topics  . Alcohol use: Yes    Alcohol/week: 1.0 standard drinks    Types: 1 Glasses of wine per week  . Drug use: No   Current Medications and Allergies:   .  amLODipine (NORVASC) 5 MG tablet, Take 1 tablet (5 mg total) by mouth daily., Disp: 90 tablet, Rfl: 3 .  aspirin 81 MG tablet, Take 81 mg by mouth daily.  , Disp: , Rfl:  .  atorvastatin (LIPITOR) 10 MG tablet, Take 1 tablet by mouth 3 times a week., Disp: 15 tablet, Rfl: 3 .  Cholecalciferol (VITAMIN  D) 2000 UNITS CAPS, Take 1 capsule (2,000 Units total) by mouth daily., Disp: 30 capsule, Rfl:  .  clobetasol (TEMOVATE) 0.05 % external solution, Apply 1 application topically daily as needed. to scalp., Disp: 50 mL, Rfl: 1 .  Coenzyme Q10 (CO Q-10) 300 MG CAPS, Take 300 mg by mouth daily. , Disp: , Rfl:  .  desoximetasone (TOPICORT) 0.05 % cream, Apply topically 2 (two) times daily., Disp: , Rfl:  .  Desoximetasone (TOPICORT) 0.25 % ointment, Apply 1 application topically 2 (two) times daily as needed (itching.). Apply to hands., Disp: , Rfl:  .  EPINEPHrine (EPIPEN) 0.3 mg/0.3 mL SOAJ injection, Inject 0.3 mLs (0.3 mg total) into the muscle once., Disp: 1 Device, Rfl: 1 .  ezetimibe (ZETIA) 10 MG tablet, Take 1 tablet (10 mg total) by mouth daily., Disp: 90 tablet, Rfl: 3 .  HYDROcodone-acetaminophen (NORCO/VICODIN) 5-325 MG tablet, Take 1 tablet by mouth every 4 (four) hours as needed for pain. for pain, Disp: , Rfl: 0 .  levothyroxine (SYNTHROID, LEVOTHROID) 75 MCG tablet, Take 1 tablet (75 mcg total) by mouth daily., Disp: 90 tablet, Rfl: 3 .  Multiple Vitamins-Minerals (EYE VITAMINS) TABS, Take 1 tablet by mouth daily.  , Disp: , Rfl:  .  nitroGLYCERIN (NITROSTAT) 0.4 MG SL tablet, Place 1 tablet (0.4 mg total) under the tongue every 5 (five) minutes as needed. For chest pain, Disp: 25 tablet, Rfl: 11   Allergies  Allergen Reactions  . Shellfish Allergy Hives and Shortness Of Breath  . Codeine     headache  . Fish-Derived Products   . Lanolin Itching  . Latex Other (See Comments)    Unknown allergy testing showed allergic  . Pollen Extract   . Statins     Simvastatin, Lipitor (both caused muscle aches); Crestor 5 mg and 10 mg daily (memory impairment), Livalo (throat and mouth itching), pravastatin 10 mg qd (memory)  . Cephalexin Itching and Rash  . Ciprofloxacin Rash  . Clindamycin/Lincomycin Rash  . Erythromycin Itching and Rash  . Penicillins Itching and Rash  . Propylene  Glycol Itching and Rash  . Tetracycline Itching and Rash  . Tetracyclines & Related Itching and Rash   Review of Systems   Pertinent items are noted in the HPI. Otherwise, ROS is negative.  Vitals:   Vitals:  05/11/18 0957  BP: 130/68  Pulse: 98  Temp: 97.9 F (36.6 C)  TempSrc: Oral  SpO2: 96%  Weight: 149 lb 6.4 oz (67.8 kg)  Height: 5' 3.5" (1.613 m)     Body mass index is 26.05 kg/m.  Physical Exam:   Physical Exam  Constitutional: She appears well-nourished.  HENT:  Head: Normocephalic and atraumatic.  Eyes: Pupils are equal, round, and reactive to light. EOM are normal.  Neck: Normal range of motion. Neck supple.  Cardiovascular: Normal rate, regular rhythm, normal heart sounds and intact distal pulses.  Pulmonary/Chest: Effort normal.  Abdominal: Soft.  Skin: Skin is warm.  Psychiatric: She has a normal mood and affect. Her behavior is normal.  Nursing note and vitals reviewed.  Assessment and Plan:   Tria was seen today for follow-up.  Diagnoses and all orders for this visit:  Osteoporosis without current pathological fracture, unspecified osteoporosis type Comments: Okay to try weight bearing exercises and calcium/D supplements per patient request. Gave info for PT [facility] and [facility].  Hypercalcemia -     VITAMIN D 25 Hydroxy (Vit-D Deficiency, Fractures) -     PTH, Intact and Calcium -     Basic metabolic panel   . Reviewed expectations re: course of current medical issues. . Discussed self-management of symptoms. . Outlined signs and symptoms indicating need for more acute intervention. . Patient verbalized understanding and all questions were answered. Marland Kitchen Health Maintenance issues including appropriate healthy diet, exercise, and smoking avoidance were discussed with patient. . See orders for this visit as documented in the electronic medical record. . Patient received an After Visit Summary.  Briscoe Deutscher, DO [place] [place]  [place] 05/11/2018  Future Appointments  Date Time Provider [place]  08/17/2018 10:00 AM Briscoe Deutscher, DO [facility]    CMA served as scribe during this visit. History, Physical, and Plan performed by medical provider. The above documentation has been reviewed and is accurate and complete. Briscoe Deutscher, D.O.

## 2018-05-11 ENCOUNTER — Telehealth: Payer: Self-pay | Admitting: Physician Assistant

## 2018-05-11 ENCOUNTER — Encounter: Payer: Self-pay | Admitting: Family Medicine

## 2018-05-11 ENCOUNTER — Ambulatory Visit: Payer: Medicare Other | Admitting: Family Medicine

## 2018-05-11 VITALS — BP 130/68 | HR 98 | Temp 97.9°F | Ht 63.5 in | Wt 149.4 lb

## 2018-05-11 DIAGNOSIS — M810 Age-related osteoporosis without current pathological fracture: Secondary | ICD-10-CM | POA: Diagnosis not present

## 2018-05-11 DIAGNOSIS — E213 Hyperparathyroidism, unspecified: Secondary | ICD-10-CM | POA: Diagnosis not present

## 2018-05-11 LAB — BASIC METABOLIC PANEL
BUN: 18 mg/dL (ref 6–23)
CO2: 28 mEq/L (ref 19–32)
Calcium: 11 mg/dL — ABNORMAL HIGH (ref 8.4–10.5)
Chloride: 105 mEq/L (ref 96–112)
Creatinine, Ser: 0.85 mg/dL (ref 0.40–1.20)
GFR: 68.91 mL/min (ref >60.00)
Glucose, Bld: 115 mg/dL — ABNORMAL HIGH (ref 70–99)
Potassium: 3.7 mEq/L (ref 3.5–5.1)
Sodium: 140 mEq/L (ref 135–145)

## 2018-05-11 LAB — VITAMIN D 25 HYDROXY (VIT D DEFICIENCY, FRACTURES): VITD: 40.03 ng/mL (ref 30.00–100.00)

## 2018-05-11 NOTE — Patient Instructions (Signed)
Preventing Osteoporosis, Adult Osteoporosis is a condition that causes the bones to get weaker. With osteoporosis, the bones become thinner, and the normal spaces in bone tissue become larger. This can make the bones weak and cause them to break more easily. People who have osteoporosis are more likely to break their wrist, spine, or hip. Even a minor accident or injury can be enough to break weak bones. Osteoporosis can occur with aging. Your body constantly replaces old bone tissue with new tissue. As you get older, you may lose bone tissue more quickly, or it may be replaced more slowly. Osteoporosis is more likely to develop if you have poor nutrition or do not get enough calcium or vitamin D. Other lifestyle factors can also play a role. By making some diet and lifestyle changes, you can help to keep your bones healthy and help to prevent osteoporosis. What nutrition changes can be made? Nutrition plays an important role in maintaining healthy, strong bones.  Make sure you get enough calcium every day from food or from calcium supplements. ? If you are age 50 or younger, aim to get 1,000 mg of calcium every day. ? If you are older than age 50, aim to get 1,200 mg of calcium every day.  Try to get enough vitamin D every day. ? If you are age 70 or younger, aim to get 600 international units (IU) every day. ? If you are older than age 70, aim to get 800 international units every day.  Follow a healthy diet. Eat plenty of foods that contain calcium and vitamin D. ? Calcium is in milk, cheese, yogurt, and other dairy products. Some fish and vegetables are also good sources of calcium. Many foods such as cereals and breads have had calcium added to them (are fortified). Check nutrition labels to see how much calcium is in a food or drink. ? Foods that contain vitamin D include milk, cereals, salmon, and tuna. Your body also makes vitamin D when you are out in the sun. Bare skin exposure to the sun on  your face, arms, legs, or back for no more than 30 minutes a day, 2 times per week is more than enough. Beyond that, it is important to use sunblock to protect your skin from sunburn, which increases your risk for skin cancer.  What lifestyle changes can be made? Making changes in your everyday life can also play an important role in preventing osteoporosis.  Stay active and get exercise every day. Ask your health care provider what types of exercise are best for you.  Do not use any products that contain nicotine or tobacco, such as cigarettes and e-cigarettes. If you need help quitting, ask your health care provider.  Limit alcohol intake to no more than 1 drink a day for nonpregnant women and 2 drinks a day for men. One drink equals 12 oz of beer, 5 oz of wine, or 1 oz of hard liquor.  Why are these changes important? Making these nutrition and lifestyle changes can:  Help you develop and maintain healthy, strong bones.  Prevent loss of bone mass and the problems that are caused by that loss, such as broken bones and delayed healing.  Make you feel better mentally and physically.  What can happen if changes are not made? Problems that can result from osteoporosis can be very serious. These may include:  A higher risk of broken bones that are painful and do not heal well.  Physical malformations, such as   a collapsed spine or a hunched back.  Problems with movement.  Where to find support: If you need help making changes to prevent osteoporosis, talk with your health care provider. You can ask for a referral to a diet and nutrition specialist (dietitian) and a physical therapist. Where to find more information: Learn more about osteoporosis from:  NIH Osteoporosis and Related Bone Diseases National Resource Center: www.niams.nih.gov/health_info/bone/osteoporosis/osteoporosis_ff.asp  U.S. Office on Women's Health:  www.womenshealth.gov/publications/our-publications/fact-sheet/osteoporosis.html  National Osteoporosis Foundation: www.nof.org/patients/what-is-osteoporosis/  Summary  Osteoporosis is a condition that causes weak bones that are more likely to break.  Eating a healthy diet and making sure you get enough calcium and vitamin D can help prevent osteoporosis.  Other ways to reduce your risk of osteoporosis include getting regular exercise and avoiding alcohol and products that contain nicotine or tobacco. This information is not intended to replace advice given to you by your health care provider. Make sure you discuss any questions you have with your health care provider. Document Released: 10/04/2015 Document Revised: 05/30/2016 Document Reviewed: 05/30/2016 Elsevier Interactive Patient Education  2018 Elsevier Inc.  

## 2018-05-11 NOTE — Telephone Encounter (Signed)
New Message    Patient is calling because she is checking mychart. On mychart she says there is an order for an echocardiogram or stress test. She is very surprised. I am not showing the order but the patient wants to discuss. Please call.

## 2018-05-11 NOTE — Telephone Encounter (Signed)
Spoke with the patient who mistakenly looked at the date for a Echo/Stress Test on my Chart scheduled 05/11/2017 and thought it was for today.  She apologized and thanked me for the time.

## 2018-05-14 LAB — PTH, INTACT AND CALCIUM
Calcium: 10.8 mg/dL — ABNORMAL HIGH (ref 8.6–10.4)
PTH: 135 pg/mL — ABNORMAL HIGH (ref 14–64)

## 2018-05-17 ENCOUNTER — Other Ambulatory Visit: Payer: Self-pay | Admitting: *Deleted

## 2018-05-17 MED ORDER — ATORVASTATIN CALCIUM 10 MG PO TABS: ORAL_TABLET | ORAL | 3 refills | Status: DC

## 2018-05-20 ENCOUNTER — Encounter: Payer: Self-pay | Admitting: Family Medicine

## 2018-05-20 NOTE — Addendum Note (Signed)
Addended by: Briscoe Deutscher R on: 05/20/2018 08:33 PM   Modules accepted: Orders

## 2018-05-21 ENCOUNTER — Other Ambulatory Visit: Payer: Self-pay

## 2018-05-21 DIAGNOSIS — E213 Hyperparathyroidism, unspecified: Secondary | ICD-10-CM

## 2018-06-11 ENCOUNTER — Ambulatory Visit (INDEPENDENT_AMBULATORY_CARE_PROVIDER_SITE_OTHER): Payer: Medicare Other | Admitting: Orthopaedic Surgery

## 2018-06-11 ENCOUNTER — Ambulatory Visit (INDEPENDENT_AMBULATORY_CARE_PROVIDER_SITE_OTHER): Payer: Medicare Other

## 2018-06-11 ENCOUNTER — Encounter (INDEPENDENT_AMBULATORY_CARE_PROVIDER_SITE_OTHER): Payer: Self-pay | Admitting: Orthopaedic Surgery

## 2018-06-11 DIAGNOSIS — M25511 Pain in right shoulder: Secondary | ICD-10-CM | POA: Diagnosis not present

## 2018-06-11 DIAGNOSIS — G8929 Other chronic pain: Secondary | ICD-10-CM | POA: Diagnosis not present

## 2018-06-11 DIAGNOSIS — M7541 Impingement syndrome of right shoulder: Secondary | ICD-10-CM

## 2018-06-11 MED ORDER — LIDOCAINE HCL 1 % IJ SOLN
3.0000 mL | INTRAMUSCULAR | Status: AC | PRN
  Administered 2018-06-11: 3 mL

## 2018-06-11 MED ORDER — METHYLPREDNISOLONE ACETATE 40 MG/ML IJ SUSP
40.0000 mg | INTRAMUSCULAR | Status: AC | PRN
  Administered 2018-06-11: 40 mg via INTRA_ARTICULAR

## 2018-06-11 NOTE — Addendum Note (Signed)
Addended by: Precious Bard on: 06/11/2018 04:29 PM   Modules accepted: Orders

## 2018-06-11 NOTE — Progress Notes (Signed)
Office Visit Note   Patient: Victoria [name]           Date of Birth: 03-13-1941           MRN: [id number] Visit Date: 06/11/2018              Requested by: Briscoe Deutscher, [address] [place], [place] [address] PCP: Briscoe Deutscher, DO   Assessment & Plan: Visit Diagnoses:  1. Chronic right shoulder pain   2. Impingement syndrome of right shoulder     Plan: She is going avoid her exercise program now for her shoulder I do feel that she benefit from outpatient physical therapy to improve her shoulder function and decrease her pain.  Also recommended a steroid injection subacromial outlet and explained the reasons behind this as well as the rationale and the risk and benefits of steroid injections.  She agreed to try this and tolerated it well.  She did report less pain after the injection.  All question concerns were answered and addressed.  We will see her back in 4 weeks to see how she is doing overall.  Follow-Up Instructions: Return in about 4 weeks (around 07/09/2018).   Orders:  Orders Placed This Encounter  Procedures  . Large Joint Inj  . XR Shoulder Right   No orders of the defined types were placed in this encounter.     Procedures: Large Joint Inj: R subacromial bursa on 06/11/2018 4:06 PM Indications: pain and diagnostic evaluation Details: 22 G 1.5 in needle  Arthrogram: No  Medications: 3 mL lidocaine 1 %; 40 mg methylPREDNISolone acetate 40 MG/ML Outcome: tolerated well, no immediate complications Procedure, treatment alternatives, risks and benefits explained, specific risks discussed. Consent was given by the patient. Immediately prior to procedure a time out was called to verify the correct patient, procedure, equipment, support staff and site/side marked as required. Patient was prepped and draped in the usual sterile fashion.       Clinical Data: No additional findings.   Subjective: Chief Complaint  Patient presents with  . Right  Shoulder - Pain  The patient is a very pleasant 77 year old female that I seen before.  She comes in with several weeks a month history of worsening right shoulder pain.  She is right-hand dominant and she denies any specific injury.  It started at night and does wake her up now due to the severity of her pain.  Activities with reaching overhead and behind her causing significant pain.  She has been in the home exercise program and working with a workout program outside the home and that is made things slightly worse.  She has never injured the shoulder.  HPI  Review of Systems  She currently denies any headache, chest pain, shortness of breath, fever, chills, nausea, vomiting. Objective: Vital Signs: There were no vitals taken for this visit.  Physical Exam She is alert and oriented x3 and in no acute distress Ortho Exam Examination of the right shoulder shows that actually moves fluidly but is very painful to do so.  She is using her rotator cuff to abduct her shoulder and not her deltoids which is a good sign.  Her internal rotation with adduction is very painful.  She has positive Neer and Hawkins signs. Specialty Comments:  No specialty comments available.  Imaging: Xr Shoulder Right  Result Date: 06/11/2018 3 views of the right shoulder show no acute findings.  There is adequate space the subacromial outlet.  The shoulder  is well located with no significant arthritic findings.    PMFS History: Patient Active Problem List   Diagnosis Date Noted  . Drug allergy, antibiotic 03/01/2018  . Pain in right hip 08/15/2017  . Chronic right-sided low back pain with right-sided sciatica 08/15/2017  . Trochanteric bursitis of right hip 10/06/2014  . Essential hypertension, benign 05/14/2011  . Vitamin D deficiency 08/02/2010  . Allergic rhinitis 07/30/2010  . Coronary atherosclerosis 01/19/2009  . Hypothyroidism 12/04/2008  . Hyperlipidemia 12/04/2008  . Osteoporosis 12/04/2008   Past  Medical History:  Diagnosis Date  . Acute lumbar radiculopathy 04/2012  . Atopic eczema   . CAD (coronary artery disease)   . Essential hypertension, benign   . Hyperlipidemia, statin intolerant   . Hypothyroidism   . Myocardial infarction ([place]) 2009  . NSTEMI (non-ST elevated myocardial infarction) ([place]) 2009  . Osteoporosis, s/p bisphophonates x 7 years, stopped in 2011   . Sacrum and coccyx fracture ([place])     Family History  Problem Relation Age of Onset  . Colon cancer Mother 61       1st degree  . Stroke Father 28  . Stroke Unknown        Grandfather- grandmother    Past Surgical History:  Procedure Laterality Date  . APPENDECTOMY    . CARDIAC CATHETERIZATION  2009  . CARPAL TUNNEL RELEASE Bilateral   . HIP SURGERY  1980s  . LAPAROSCOPIC HYSTERECTOMY    . LEFT HEART CATHETERIZATION WITH CORONARY ANGIOGRAM N/A 11/18/2013   Procedure: LEFT HEART CATHETERIZATION WITH CORONARY ANGIOGRAM;  Surgeon: Blane Ohara, MD;  Location[facility] CATH LAB;  Service: Cardiovascular;  Laterality: N/A;  . LUMBAR LAMINECTOMY/DECOMPRESSION MICRODISCECTOMY  04/17/2012   Procedure: LUMBAR LAMINECTOMY/DECOMPRESSION MICRODISCECTOMY 1 LEVEL;  Surgeon: Erline Levine, MD;  Location: [facility] NEURO ORS;  Service: Neurosurgery;  Laterality: Left;  Left Lumbar five-Sacral one Microdiskectomy  . TONSILLECTOMY     Social History   Occupational History  . Occupation: retired  Tobacco Use  . Smoking status: Never Smoker  . Smokeless tobacco: Never Used  Substance and Sexual Activity  . Alcohol use: Yes    Alcohol/week: 1.0 standard drinks    Types: 1 Glasses of wine per week  . Drug use: No  . Sexual activity: Not on file

## 2018-06-13 ENCOUNTER — Telehealth: Payer: Self-pay | Admitting: Family Medicine

## 2018-06-13 NOTE — Telephone Encounter (Signed)
levothyroxine refill Last Refill:? Last OV: 05/11/18 PCP: Dr Briscoe Deutscher Pharmacy:[facility]  clobetasol Last Refill:? Last OV: 05/11/18 PCP: Dr Briscoe Deutscher [facility]acy:[facility]  Desoximetasone Last Refill:? Last OV: 05/11/18 PCP: Dr Briscoe Deutscher [facility]acy:[facility]

## 2018-06-13 NOTE — Telephone Encounter (Signed)
Copied from [place] [id number]. Topic: Quick Communication - Rx Refill/Question >> Jun 13, 2018  2:13 PM Neva Seat wrote: levothyroxine (SYNTHROID, LEVOTHROID) 75 MCG tablet clobetasol (TEMOVATE) 0.05 % external solution Desoximetasone (TOPICORT) 0.25 % ointment  Needing refills   [facility] [place] [facility]  [phone] (Phone) [phone] (Fax)

## 2018-06-14 ENCOUNTER — Other Ambulatory Visit: Payer: Self-pay

## 2018-06-14 ENCOUNTER — Ambulatory Visit: Payer: Medicare Other | Attending: Orthopaedic Surgery

## 2018-06-14 DIAGNOSIS — M6281 Muscle weakness (generalized): Secondary | ICD-10-CM | POA: Diagnosis present

## 2018-06-14 DIAGNOSIS — G8929 Other chronic pain: Secondary | ICD-10-CM | POA: Diagnosis present

## 2018-06-14 DIAGNOSIS — M25511 Pain in right shoulder: Secondary | ICD-10-CM | POA: Diagnosis present

## 2018-06-14 DIAGNOSIS — M25611 Stiffness of right shoulder, not elsewhere classified: Secondary | ICD-10-CM | POA: Diagnosis present

## 2018-06-14 DIAGNOSIS — R293 Abnormal posture: Secondary | ICD-10-CM | POA: Diagnosis present

## 2018-06-14 DIAGNOSIS — E039 Hypothyroidism, unspecified: Secondary | ICD-10-CM

## 2018-06-14 NOTE — Patient Instructions (Signed)
Access Code: [email or web address]  URL: [email or web address]  Date: 06/14/2018  Prepared by: Sigurd Sos   Exercises  Scapular Retraction with Resistance - 10 reps - 2 sets - 2x daily - 7x weekly  Seated Scapular Retraction - 10 reps - 1 sets - 5 hold - 5x daily - 7x weekly  Shoulder extension with resistance - Neutral - 10 reps - 2 sets - 2x daily - 7x weekly  Seated Correct Posture - 10 reps - 3 sets - 1x daily - 7x weekly  Shoulder Flexion Wall Slide with Towel - 10 reps - 2 sets - 12x daily - 7x weekly

## 2018-06-14 NOTE — Therapy (Addendum)
[facility] [address], [place], [place], [address] Phone: [phone]   Fax:  [phone]  Physical Therapy Evaluation  Patient Details  Name: Victoria [name] MRN: [id number] Date of Birth: 1941-02-06 Referring Provider: Jean Rosenthal, MD   Encounter Date: 06/14/2018  PT End of Session - 06/14/18 1330    Visit Number  1    Date for PT Re-Evaluation  07/26/18    Authorization Type  [employer] Medicare    PT Start Time  1146    PT Stop Time  1232    PT Time Calculation (min)  46 min    Activity Tolerance  Patient tolerated treatment well    Behavior During Therapy  [facility] for tasks assessed/performed       Past Medical History:  Diagnosis Date  . Acute lumbar radiculopathy 04/2012  . Atopic eczema   . CAD (coronary artery disease)   . Essential hypertension, benign   . Hyperlipidemia, statin intolerant   . Hypothyroidism   . Myocardial infarction ([place]) 2009  . NSTEMI (non-ST elevated myocardial infarction) ([place]) 2009  . Osteoporosis, s/p bisphophonates x 7 years, stopped in 2011   . Sacrum and coccyx fracture [facility])     Past Surgical History:  Procedure Laterality Date  . APPENDECTOMY    . CARDIAC CATHETERIZATION  2009  . CARPAL TUNNEL RELEASE Bilateral   . HIP SURGERY  1980s  . LAPAROSCOPIC HYSTERECTOMY    . LEFT HEART CATHETERIZATION WITH CORONARY ANGIOGRAM N/A 11/18/2013   Procedure: LEFT HEART CATHETERIZATION WITH CORONARY ANGIOGRAM;  Surgeon: Blane Ohara, MD;  Location: [facility] CATH LAB;  Service: Cardiovascular;  Laterality: N/A;  . LUMBAR LAMINECTOMY/DECOMPRESSION MICRODISCECTOMY  04/17/2012   Procedure: LUMBAR LAMINECTOMY/DECOMPRESSION MICRODISCECTOMY 1 LEVEL;  Surgeon: Erline Levine, MD;  Location: [place] NEURO ORS;  Service: Neurosurgery;  Laterality: Left;  Left Lumbar five-Sacral one Microdiskectomy  . SPINE SURGERY  03/2018   Dr Vertell Limber  . TONSILLECTOMY      There were no vitals filed for this  visit.   Subjective Assessment - 06/14/18 1140    Subjective  Pt presents to PT with complaint of worsening Rt shoulder pain over the past 3-4 weeks without incident or injury.  Pt began an exercise program at [employer] ([employer]) to include strength and endurance. Pt has not been to the gym this week but plans to return 2x/wk soon.  Pt had cortizone injection 06/11/18 that helped to reduce pain.      Pertinent History  Lumbar surgery for stenosis 03/2018, osteoporosis    Diagnostic tests  x-ray: no acute findings    Patient Stated Goals  exercise without increased pain, reach overhead, reduce Rt shoulder pain    Currently in Pain?  Yes    Pain Score  4     Pain Location  Shoulder    Pain Orientation  Right    Pain Descriptors / Indicators  Aching;Pressure    Pain Type  Chronic pain    Pain Onset  More than a month ago    Pain Frequency  Intermittent    Aggravating Factors   lying on Rt side, reaching overhead, sometimes gym exercises    Pain Relieving Factors  stop the aggravating activity, rest         [facility] PT Assessment - 06/14/18 0001      Assessment   Medical Diagnosis  chronic Rt shoulder pain, impingement syndrome of Rt shoulder    Referring Provider  Jean Rosenthal,  MD    Onset Date/Surgical Date  05/14/18    Hand Dominance  Right    Next MD Visit  07/2018    Prior Therapy  for lumbar spine prior to surgery      Precautions   Precautions  Other (comment)   osteoporosis     Restrictions   Weight Bearing Restrictions  No      Balance Screen   Has the patient fallen in the past 6 months  No    Has the patient had a decrease in activity level because of a fear of falling?   No    Is the patient reluctant to leave their home because of a fear of falling?   No      Home Film[place]video [place] residence    Living Arrangements  Spouse/significant other    Type of [place]      Prior Function   Level of [place]  Retired    Leisure  reading, play games      Cognition   Overall Cognitive Status  Within Functional Limits for tasks assessed      Observation/Other Assessments   Focus on Therapeutic Outcomes (FOTO)   45% limitation      Posture/Postural Control   Posture/Postural Control  Postural limitations    Postural Limitations  Rounded Shoulders;Forward head      ROM / Strength   AROM / PROM / Strength  AROM;Strength;PROM      AROM   Overall AROM   Deficits    Overall AROM Comments  Cervical A/ROM is full.  Lt shoulder A/ROM is full without pain.  Lt shoulder A/ROM is equal to the Lt except IR is limited by 2 inches vs the Lt with pain.        PROM   Overall PROM   Deficits    PROM Assessment Site  Shoulder    Right/Left Shoulder  Left    Left Shoulder ABduction  120 Degrees   with pain   Left Shoulder Internal Rotation  45 Degrees   45 at 90 degrees abduction, 60 at 30 degrees abduction     Strength   Overall Strength  Deficits    Overall Strength Comments  Lt shoulder 4+/5 throughout    Strength Assessment Site  Shoulder    Right/Left Shoulder  Right    Right Shoulder Flexion  4-/5    Right Shoulder Internal Rotation  4-/5    Right Shoulder External Rotation  4-/5      Palpation   Palpation comment  decreased Rt glenohumeral joint mobility in all directions with pain.  Palpable tenderness over anterior aspect of glenohumeral joint      Ambulation/Gait   Gait Pattern  Within Functional Limits                Objective measurements completed on examination: See above findings.              PT Education - 06/14/18 1238    Education Details   Access Code: [email or web address]     Person(s) Educated  Patient    Methods  Explanation;Demonstration;Handout    Comprehension  Verbalized understanding;Returned demonstration       PT Short Term Goals - 06/14/18 1138      PT SHORT TERM GOAL #1   Title  be independent in initial HEP    Time  4    Period  Weeks    Status  New    Target Date  07/05/18      PT SHORT TERM GOAL #2   Title  report 30% reduction in Rt shoulder pain with reaching overhead    Baseline  --    Time  4    Period  Weeks    Status  New    Target Date  07/05/18      PT SHORT TERM GOAL #3   Title  verbalize and demonstrate postural corrections to improve scapular position with use of Rt UE    Baseline  --    Time  4    Period  Weeks    Status  New    Target Date  07/05/18      PT SHORT TERM GOAL #4   Title  --        PT Long Term Goals - 06/14/18 1139      PT LONG TERM GOAL #1   Title  be independent in advanced HEP    Time  6    Period  Weeks    Status  New    Target Date  07/26/18      PT LONG TERM GOAL #2   Title  report a 60% reduction in Rt arm pain with use overhead    Baseline  --    Time  6    Period  Weeks    Status  New    Target Date  07/26/18      PT LONG TERM GOAL #3   Title  demonstrate 4+/5 Rt shoulder strength to improve functional use and allow for participation in exercise class    Time  6    Period  Weeks    Status  New    Target Date  07/26/18      PT LONG TERM GOAL #4   Title  demonstrate Rt=Lt shoulder A/ROM IR to improve use with self-care    Time  6    Period  Weeks    Status  On-going    Target Date  07/26/18      PT LONG TERM GOAL #5   Title  reduce FOTO to < or = to 36% limitation    Baseline  --    Time  6    Period  Weeks    Status  New    Target Date  07/26/18             Plan - 06/14/18 1303    Clinical Impression Statement  Pt is a Rt hand dominant female who presents to PT with a 3-4 week history of Rt shoulder pain. Pt began to exercise with a [employer] program and has had to reduce the frequency of this due to shoulder pain. Recent x-ray was negative.  Pt received cortisone injection to the Rt shoulder bursa 06/11/18.  Pt demonstrates forward head and rounded shoulder posture.  Pt with Rt=Lt shoulder A/ROM except IR limited by 2 inches  and limited PROM into abduction and IR due to pain, and reduced Rt shoulder joint mobility.  Pt with Rt shoulder weakness with pain with resisted testing.  Pt will benefit from skilled PT for scapular strength, shoulder A/ROM and joint mobility to allow for use of Rt UE overhead and exercise without increased pain.      History and Personal Factors relevant to plan of care:  osteoporosis, recent lumbar surgery    Clinical Presentation  Stable  Clinical Presentation due to:  stable condition consistent with impingement syndrome    Clinical Decision Making  Low    Rehab Potential  Good    PT Frequency  1x / week    PT Duration  6 weeks    PT Treatment/Interventions  ADLs/Self Care Home Management;Cryotherapy;Electrical Stimulation;Moist Heat;Therapeutic exercise;Therapeutic activities;Functional mobility training;Neuromuscular re-education;Patient/family education;Passive range of motion;Taping;Dry needling    PT Next Visit Plan  scapular strength, Rt shoulder flexibility.  1x/wk to build HEP and discuss exercise at [place] Code: [email or web address]     Consulted and Agree with Plan of Care  Patient       Patient will benefit from skilled therapeutic intervention in order to improve the following deficits and impairments:  Pain, Improper body mechanics, Postural dysfunction, Decreased activity tolerance, Decreased endurance, Decreased range of motion, Decreased strength, Impaired UE functional use  Visit Diagnosis: Chronic right shoulder pain - Plan: PT plan of care cert/re-cert  Abnormal posture - Plan: PT plan of care cert/re-cert  Stiffness of right shoulder, not elsewhere classified - Plan: PT plan of care cert/re-cert  Muscle weakness (generalized) - Plan: PT plan of care cert/re-cert     Problem List Patient Active Problem List   Diagnosis Date Noted  . Drug allergy, antibiotic 03/01/2018  . Pain in right hip 08/15/2017  . Chronic right-sided low  back pain with right-sided sciatica 08/15/2017  . Trochanteric bursitis of right hip 10/06/2014  . Essential hypertension, benign 05/14/2011  . Vitamin D deficiency 08/02/2010  . Allergic rhinitis 07/30/2010  . Coronary atherosclerosis 01/19/2009  . Hypothyroidism 12/04/2008  . Hyperlipidemia 12/04/2008  . Osteoporosis 12/04/2008     Sigurd Sos, PT 06/14/18 1:33 PM PHYSICAL THERAPY DISCHARGE SUMMARY  Visits from Start of Care: 1  Current functional level related to goals / functional outcomes: See above for current status.  Pt didn't return after evaluation.     Remaining deficits: See above   Education / Equipment: HEP Plan: Patient agrees to discharge.  Patient goals were not met. Patient is being discharged due to not returning since the last visit.  ?????         Sigurd Sos, PT 08/14/18 9:42 AM    [facility] [address] [place], [place], [address] Phone: [phone]   Fax:  [phone]  Name: Victoria [name] MRN: [id number] Date of Birth: June 18, 1941

## 2018-06-14 NOTE — Telephone Encounter (Signed)
Ok to fill never given by you

## 2018-06-15 ENCOUNTER — Other Ambulatory Visit: Payer: Self-pay

## 2018-06-15 DIAGNOSIS — E039 Hypothyroidism, unspecified: Secondary | ICD-10-CM

## 2018-06-15 MED ORDER — DESOXIMETASONE 0.05 % EX CREA: TOPICAL_CREAM | Freq: Two times a day (BID) | CUTANEOUS | 1 refills | Status: DC

## 2018-06-15 MED ORDER — CLOBETASOL PROPIONATE 0.05 % EX SOLN: 1.0000 "application " | Freq: Every day | CUTANEOUS | 1 refills | Status: DC | PRN

## 2018-06-15 MED ORDER — LEVOTHYROXINE SODIUM 75 MCG PO TABS: 75.0000 ug | ORAL_TABLET | Freq: Every day | ORAL | 3 refills | Status: DC

## 2018-06-15 NOTE — Telephone Encounter (Signed)
Okay to fill? 

## 2018-06-15 NOTE — Telephone Encounter (Signed)
Refill sent in

## 2018-06-27 ENCOUNTER — Encounter: Payer: Self-pay | Admitting: Internal Medicine

## 2018-06-27 ENCOUNTER — Ambulatory Visit: Payer: Medicare Other | Admitting: Internal Medicine

## 2018-06-27 VITALS — BP 130/80 | HR 90 | Ht 63.5 in | Wt 148.0 lb

## 2018-06-27 DIAGNOSIS — E559 Vitamin D deficiency, unspecified: Secondary | ICD-10-CM

## 2018-06-27 DIAGNOSIS — E213 Hyperparathyroidism, unspecified: Secondary | ICD-10-CM | POA: Diagnosis not present

## 2018-06-27 DIAGNOSIS — M810 Age-related osteoporosis without current pathological fracture: Secondary | ICD-10-CM | POA: Diagnosis not present

## 2018-06-27 DIAGNOSIS — E210 Primary hyperparathyroidism: Secondary | ICD-10-CM | POA: Insufficient documentation

## 2018-06-27 DIAGNOSIS — Z8639 Personal history of other endocrine, nutritional and metabolic disease: Secondary | ICD-10-CM | POA: Insufficient documentation

## 2018-06-27 LAB — PHOSPHORUS: PHOSPHORUS: 2.9 mg/dL (ref 2.3–4.6)

## 2018-06-27 LAB — MAGNESIUM: MAGNESIUM: 2.3 mg/dL (ref 1.5–2.5)

## 2018-06-27 NOTE — Patient Instructions (Signed)
Please stop at the lab.  Continue vitamin D 2000 units daily.  Patient information (Up-to-Date): Collection of a 24-hour urine specimen  - You should collect every drop of urine during each 24-hour period. It does not matter how much or little urine is passed each time, as long as every drop is collected. - Begin the urine collection in the morning after you wake up, after you have emptied your bladder for the first time. - Urinate (empty the bladder) for the first time and flush it down the toilet. Note the exact time (eg, 6:15 AM). You will begin the urine collection at this time. - Collect every drop of urine during the day and night in an empty collection bottle. Store the bottle at room temperature or in the refrigerator. - If you need to have a bowel movement, any urine passed with the bowel movement should be collected. Try not to include feces with the urine collection. If feces does get mixed in, do not try to remove the feces from the urine collection bottle. - Finish by collecting the first urine passed the next morning, adding it to the collection bottle. This should be within ten minutes before or after the time of the first morning void on the first day (which was flushed). In this example, you would try to void between 6:05 and 6:25 on the second day. - If you need to urinate one hour before the final collection time, drink a full glass of water so that you can void again at the appropriate time. If you have to urinate 20 minutes before, try to hold the urine until the proper time. - Please note the exact time of the final collection, even if it is not the same time as when collection began on day 1. - The bottle(s) may be kept at room temperature for a day or two, but should be kept cool or refrigerated for longer periods of time.  Please come back for a follow-up appointment in 4 months

## 2018-06-27 NOTE — Progress Notes (Addendum)
Patient ID: Victoria [name], female   DOB: 06-02-1941, 77 y.o.   MRN: [id number]   HPI  Victoria [name] is a 77 y.o.-year-old female, referred by her PCP, Dr. Juleen China, for management of hypercalcemia/hyperparathyroidism.  Pt has had hypercalcemia at least since in 2015; I reviewed pt's pertinent labs: Lab Results  Component Value Date   PTH 135 (H) 05/11/2018   PTH 62.4 04/07/2009   CALCIUM 10.8 (H) 05/11/2018   CALCIUM 11.0 (H) 05/11/2018   CALCIUM 10.7 (H) 05/03/2018   CALCIUM 10.3 06/08/2017   CALCIUM 10.9 (H) 04/13/2017   CALCIUM 11.0 (H) 05/17/2016   CALCIUM 10.6 (H) 01/09/2015   CALCIUM 10.6 (H) 12/06/2013   CALCIUM 10.1 11/14/2013   CALCIUM 10.4 04/16/2012  03/25/2011: Calcium 10.1 (8.6-10.2) 07/22/2010, calcium 10 (8.6-10.2)  I reviewed pt's latest DXA scan report >> osteoporosis: 08/07/2017 Lumbar spine L1-L4 Femoral neck (FN)  T-score -2.7 RFN: -3.1 LFN: -3.1  Change in BMD from previous DXA test from 2015 (%) -3.4%* -7.9%*  (*) statistically significant  No fractures or falls.   She started to do weight bearing exercise 2-3x a week.  No h/o kidney stones.  No h/o CKD. Last BUN/Cr: Lab Results  Component Value Date   BUN 18 05/11/2018   BUN 14 05/03/2018   CREATININE 0.85 05/11/2018   CREATININE 0.81 05/03/2018   Pt is not on HCTZ.  + h/o vitamin D deficiency. Reviewed vit D levels: Lab Results  Component Value Date   VD25OH 40.03 05/11/2018   VD25OH 36 01/23/2014   VD25OH 39 03/12/2012   VD25OH 39 03/07/2011   VD25OH 32 04/07/2009  07/30/2010: Vitamin D 27.2  Pt is on vitamin D 2000 units daily.  Pt does not have a FH of hypercalcemia, pituitary tumors, thyroid cancer, or osteoporosis.   Pt. also has a history of hypothyroidism, controlled, on levothyroxine 75 mcg daily.  Latest TSH was normal in 05/2017: Lab Results  Component Value Date   TSH 2.040 05/03/2018   TSH 0.88 07/05/2017   TSH 0.14 (L) 04/13/2017   TSH 0.52 05/17/2016   TSH  0.17 (L) 01/09/2015   ROS: Constitutional: no weight gain/loss, + fatigue, no subjective hyperthermia/+ subjective hypothermia, + nocturia Eyes: no blurry vision, no xerophthalmia ENT: no sore throat, no nodules palpated in throat, no dysphagia/odynophagia, no hoarseness, + hypoacusis Cardiovascular: + Chronic CP for which she usually takes 2 aspirins or nitroglycerin with subsequent relief/+ SOB/no palpitations/leg swelling Respiratory: + Cough/+ SOB Gastrointestinal: no N/V/D/C/+ heartburn Musculoskeletal: + Both muscle/joint aches Skin: + Rash, + itching Neurological: no tremors/numbness/tingling/dizziness, + headaches Psychiatric: no depression/+ anxiety  Past Medical History:  Diagnosis Date  . Acute lumbar radiculopathy 04/2012  . Atopic eczema   . CAD (coronary artery disease)   . Essential hypertension, benign   . Hyperlipidemia, statin intolerant   . Hypothyroidism   . Myocardial infarction ([place]) 2009  . NSTEMI (non-ST elevated myocardial infarction) ([place]) 2009  . Osteoporosis, s/p bisphophonates x 7 years, stopped in 2011   . Sacrum and coccyx fracture [facility])    Past Surgical History:  Procedure Laterality Date  . APPENDECTOMY    . CARDIAC CATHETERIZATION  2009  . CARPAL TUNNEL RELEASE Bilateral   . HIP SURGERY  1980s  . LAPAROSCOPIC HYSTERECTOMY    . LEFT HEART CATHETERIZATION WITH CORONARY ANGIOGRAM N/A 11/18/2013   Procedure: LEFT HEART CATHETERIZATION WITH CORONARY ANGIOGRAM;  Surgeon: Blane Ohara, MD;  Location: [facility] CATH LAB;  Service: Cardiovascular;  Laterality: N/A;  .  LUMBAR LAMINECTOMY/DECOMPRESSION MICRODISCECTOMY  04/17/2012   Procedure: LUMBAR LAMINECTOMY/DECOMPRESSION MICRODISCECTOMY 1 LEVEL;  Surgeon: Erline Levine, MD;  Location: [facility] NEURO ORS;  Service: Neurosurgery;  Laterality: Left;  Left Lumbar five-Sacral one Microdiskectomy  . SPINE SURGERY  03/2018   Dr Vertell Limber  . TONSILLECTOMY     Social History   Socioeconomic History  . Marital status:  Married    Spouse name: Not on file  . Number of children: 2  . Years of education: Not on file  . Highest education level: Not on file  Occupational History  . Occupation: retired  Scientific laboratory technician  . Financial resource strain: Not on file  . Food insecurity:    Worry: Not on file    Inability: Not on file  . Transportation needs:    Medical: Not on file    Non-medical: Not on file  Tobacco Use  . Smoking status: Never Smoker  . Smokeless tobacco: Never Used  Substance and Sexual Activity  . Alcohol use: Yes    Alcohol/week: 1.0 standard drinks    Types: 1 Glasses of wine per week  . Drug use: No  . Sexual activity: Not on file  Lifestyle  . Physical activity:    Days per week: Not on file    Minutes per session: Not on file  . Stress: Not on file  Relationships  . Social connections:    Talks on phone: Not on file    Gets together: Not on file    Attends religious service: Not on file    Active member of club or organization: Not on file    Attends meetings of clubs or organizations: Not on file    Relationship status: Not on file  . Intimate partner violence:    Fear of current or ex partner: Not on file    Emotionally abused: Not on file    Physically abused: Not on file    Forced sexual activity: Not on file  Other Topics Concern  . Not on file  Social History Narrative  . Not on file   Current Outpatient Medications on File Prior to Visit  Medication Sig Dispense Refill  . amLODipine (NORVASC) 5 MG tablet Take 1 tablet (5 mg total) by mouth daily. 90 tablet 3  . aspirin 81 MG tablet Take 81 mg by mouth daily.      Marland Kitchen atorvastatin (LIPITOR) 10 MG tablet Take 1 tablet by mouth 3 times a week. 45 tablet 3  . Cholecalciferol (VITAMIN D) 2000 UNITS CAPS Take 1 capsule (2,000 Units total) by mouth daily. 30 capsule   . clobetasol (TEMOVATE) 0.05 % external solution Apply 1 application topically daily as needed. to scalp. 50 mL 1  . Coenzyme Q10 (CO Q-10) 300 MG CAPS  Take 300 mg by mouth daily.     Marland Kitchen desoximetasone (TOPICORT) 0.05 % cream Apply topically 2 (two) times daily. 60 g 1  . Desoximetasone (TOPICORT) 0.25 % ointment Apply 1 application topically 2 (two) times daily as needed (itching.). Apply to hands.    Marland Kitchen EPINEPHrine (EPIPEN) 0.3 mg/0.3 mL SOAJ injection Inject 0.3 mLs (0.3 mg total) into the muscle once. 1 Device 1  . ezetimibe (ZETIA) 10 MG tablet Take 1 tablet (10 mg total) by mouth daily. 90 tablet 3  . HYDROcodone-acetaminophen (NORCO/VICODIN) 5-325 MG tablet Take 1 tablet by mouth every 4 (four) hours as needed for pain. for pain  0  . levothyroxine (SYNTHROID, LEVOTHROID) 75 MCG tablet Take 1 tablet (75  mcg total) by mouth daily. 90 tablet 3  . Multiple Vitamins-Minerals (EYE VITAMINS) TABS Take 1 tablet by mouth daily.      . nitroGLYCERIN (NITROSTAT) 0.4 MG SL tablet Place 1 tablet (0.4 mg total) under the tongue every 5 (five) minutes as needed. For chest pain 25 tablet 11   No current facility-administered medications on file prior to visit.    Allergies  Allergen Reactions  . Shellfish Allergy Hives and Shortness Of Breath  . Codeine     headache  . Fish-Derived Products   . Lanolin Itching  . Latex Other (See Comments)    Unknown allergy testing showed allergic  . Pollen Extract   . Statins     Simvastatin, Lipitor (both caused muscle aches); Crestor 5 mg and 10 mg daily (memory impairment), Livalo (throat and mouth itching), pravastatin 10 mg qd (memory)  . Cephalexin Itching and Rash  . Ciprofloxacin Rash  . Clindamycin/Lincomycin Rash  . Erythromycin Itching and Rash  . Penicillins Itching and Rash  . Propylene Glycol Itching and Rash  . Tetracycline Itching and Rash  . Tetracyclines & Related Itching and Rash   Family History  Problem Relation Age of Onset  . Colon cancer Mother 74       1st degree  . Stroke Father 97  . Stroke Unknown        Grandfather- grandmother    PE: BP 130/80   Pulse 90   Ht 5'  3.5" (1.613 m) Comment: measured  Wt 148 lb (67.1 kg)   SpO2 97%   BMI 25.81 kg/m  Wt Readings from Last 3 Encounters:  06/27/18 148 lb (67.1 kg)  05/11/18 149 lb 6.4 oz (67.8 kg)  05/01/18 149 lb (67.6 kg)   Constitutional: overweight, in NAD. No kyphosis. Eyes: PERRLA, EOMI, no exophthalmos ENT: moist mucous membranes, no thyromegaly, no cervical lymphadenopathy Cardiovascular: RRR, No MRG Respiratory: CTA B Gastrointestinal: abdomen soft, NT, ND, BS+ Musculoskeletal: no deformities, strength intact in all 4 Skin: moist, warm, no rashes Neurological: no tremor with outstretched hands, DTR normal in all 4  Assessment: 1. Hypercalcemia/hyperparathyroidism  2.  History of vitamin D deficiency  3.  Osteoporosis  Plan: Patient has had several instances of elevated calcium, with the highest level being at 11.0. An intact PTH level was also high, at 135 for a corresponding calcium of 10.8 (elevated).  - Patient also  has vitamin D deficiency,  with the last level being 40.03 in 05/2018.  She is on 2000 units vitamin D daily. -She does not have a h/o nephrolithiasis, but does have osteoporosis. No fractures, abdominal pain, depression, bone pain. - I discussed with the patient and her husband about the physiology of calcium and parathyroid hormone, and possible side effects from increased PTH, including kidney stones, osteoporosis, abdominal pain, etc.  - We discussed that we need to check whether her hyperparathyroidism is primary (Familial hypercalcemic hypocalciuria or parathyroid adenoma) or secondary (to conditions like: vitamin D deficiency, calcium malabsorption, hypercalciuria, renal insufficiency, etc.). - I discussed with her that in the setting of a low vitamin D, the parathyroid hormone can be elevated, which is not a pathologic finding. However, if the PTH is elevated in the setting of a normal vitamin D, we will further need to investigate her for primary or secondary  hyperparathyroidism.  Today we will check: calcium level intact PTH ([facility]) Magnesium Phosphorus Calcitriol 24h urinary calcium/creatinine ratio - given verbal and written instructions for urine collection - We discussed  possible consequences of hyperparathyroidism: ~1/3 pts will develop complications over 15 years (OP, nephrolithiasis).  - If the tests indicate a parathyroid adenoma, she agrees with a referral to surgery.  - Criteria for parathyroid surgery are:  . Increased calcium by more than 1 mg/dL above the upper limit of normal  . Kidney ds.  . Osteoporosis (or vertebral fracture) . Age <67 years old Newer criteria (2013): Marland Kitchen High UCa >400 mg/d and increased stone risk by biochemical stone risk analysis . Presence of nephrolithiasis or nephrocalcinosis . Pt's preference!  - I will see the patient back in 4 months  2.  History of vitamin D deficiency -Reviewed latest vitamin D level from 05/11/2017: 40.03, normal -Continue 2000 units vitamin D daily  3.  Osteoporosis -We reviewed her latest bone density report.  I explained that this shows osteoporosis and the T scores are lower than the one from 2015. -She started weightbearing exercises 2-3 times a week in an effort to improve her bone density -We discussed that resolving her parathyroid problem will also help. -However, she may need to start antiresorptive medication afterwards.  We discussed that the frequency of side effects are minute compared to the risk of a fracture.  She agrees to start a medication if she needs to in the future, but would like to repeat bone density 2 years after the previous.  Component     Latest Ref Rng & Units 06/27/2018          Glucose     65 - 99 mg/dL 91  BUN     7 - 25 mg/dL 16  Creatinine     0.60 - 0.93 mg/dL 0.76  GFR, Est Non African American     > OR = 60 mL/min/1.4m2 76  GFR, Est African American     > OR = 60 mL/min/1.51m2 88  BUN/Creatinine Ratio     6 - 22 (calc) NOT  APPLICABLE  Sodium     222 - 146 mmol/L 137  Potassium     3.5 - 5.3 mmol/L 4.3  Chloride     98 - 110 mmol/L 104  CO2     20 - 32 mmol/L 27  Calcium     8.6 - 10.4 mg/dL 11.0 (H)  Vitamin D 1, 25 (OH) Total     18 - 72 pg/mL 84 (H)  Vitamin D3 1, 25 (OH)     pg/mL 84  Vitamin D2 1, 25 (OH)     pg/mL <8  PTH, Intact     15 - 65 pg/mL 71 (H)  Phosphorus     2.3 - 4.6 mg/dL 2.9  Magnesium     1.5 - 2.5 mg/dL 2.3   Labs consistent with primary hyperparathyroidism.  Calcium and PTH are elevated, as is her calcitriol.  Magnesium and phosphorus normal.  We will refer her to surgery, as discussed.  Addendum: 24-hour urine calcium also elevated: Component     Latest Ref Rng & Units 07/04/2018          Creatinine, 24H Ur     0.50 - 2.15 g/24 h 0.88  Calcium, 24H Urine     mg/24 h 273 (H)    Philemon Kingdom, MD PhD [facility] Endocrinology

## 2018-06-28 LAB — PARATHYROID HORMONE, INTACT (NO CA): PTH: 71 pg/mL — ABNORMAL HIGH (ref 15–65)

## 2018-06-30 LAB — BASIC METABOLIC PANEL WITH GFR
BUN: 16 mg/dL (ref 7–25)
CO2: 27 mmol/L (ref 20–32)
CREATININE: 0.76 mg/dL (ref 0.60–0.93)
Calcium: 11 mg/dL — ABNORMAL HIGH (ref 8.6–10.4)
Chloride: 104 mmol/L (ref 98–110)
GFR, Est African American: 88 mL/min/{1.73_m2} (ref >=60)
GFR, Est Non African American: 76 mL/min/{1.73_m2} (ref >=60)
GLUCOSE: 91 mg/dL (ref 65–99)
Potassium: 4.3 mmol/L (ref 3.5–5.3)
Sodium: 137 mmol/L (ref 135–146)

## 2018-06-30 LAB — VITAMIN D 1,25 DIHYDROXY
Vitamin D 1, 25 (OH)2 Total: 84 pg/mL — ABNORMAL HIGH (ref 18–72)
Vitamin D2 1, 25 (OH)2: <8 pg/mL
Vitamin D3 1, 25 (OH)2: 84 pg/mL

## 2018-07-02 ENCOUNTER — Other Ambulatory Visit: Payer: Self-pay | Admitting: Internal Medicine

## 2018-07-02 NOTE — Addendum Note (Signed)
Addended by: Philemon Kingdom on: 07/02/2018 01:23 PM   Modules accepted: Orders

## 2018-07-04 NOTE — Addendum Note (Signed)
Addended by: Kaylyn Lim I on: 07/04/2018 10:37 AM   Modules accepted: Orders

## 2018-07-05 LAB — CREATININE, URINE, 24 HOUR: CREATININE 24H UR: 0.88 g/(24.h) (ref 0.50–2.15)

## 2018-07-05 LAB — CALCIUM, URINE, 24 HOUR: CALCIUM 24H UR: 273 mg/(24.h) — AB

## 2018-07-06 ENCOUNTER — Encounter

## 2018-07-10 ENCOUNTER — Other Ambulatory Visit (HOSPITAL_COMMUNITY): Payer: Self-pay | Admitting: Surgery

## 2018-07-10 ENCOUNTER — Ambulatory Visit (INDEPENDENT_AMBULATORY_CARE_PROVIDER_SITE_OTHER): Payer: Medicare Other | Admitting: Orthopaedic Surgery

## 2018-07-10 ENCOUNTER — Encounter (INDEPENDENT_AMBULATORY_CARE_PROVIDER_SITE_OTHER): Payer: Self-pay | Admitting: Orthopaedic Surgery

## 2018-07-10 DIAGNOSIS — M25511 Pain in right shoulder: Secondary | ICD-10-CM | POA: Diagnosis not present

## 2018-07-10 DIAGNOSIS — Z923 Personal history of irradiation: Secondary | ICD-10-CM

## 2018-07-10 DIAGNOSIS — M7541 Impingement syndrome of right shoulder: Secondary | ICD-10-CM | POA: Diagnosis not present

## 2018-07-10 DIAGNOSIS — G8929 Other chronic pain: Secondary | ICD-10-CM | POA: Diagnosis not present

## 2018-07-10 DIAGNOSIS — E039 Hypothyroidism, unspecified: Secondary | ICD-10-CM

## 2018-07-10 DIAGNOSIS — E210 Primary hyperparathyroidism: Secondary | ICD-10-CM

## 2018-07-10 NOTE — Progress Notes (Signed)
The patient is here for continued follow-up of her right shoulder.  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  I did provide a steroid injection 1 month ago in her right shoulder subacromial outlet area and she said the shot was very helpful but short-lived.  She still having pain with reaching above and reaching behind her and she cannot lay on that side at night.  She points to the subdeltoid area as a source of her pain.  Of note she is having parathyroid hormone disease worked up and is seen Dr. Harlow Asa for this with [facility] surgery.  She says an MRI is the next step.  On examination her right shoulder she is got good range of motion shoulder and she does have positive signs of impingement.  I will try topical anti-inflammatories for now.  We will hold off any other interventions since the parathyroid issue is much more important.  If she would like to try another injection at any point she will give [place] a call and she also would like to wait for any other intervention until she finds out what is going on with her parathyroid.  All question concerns were answered and addressed.

## 2018-07-17 ENCOUNTER — Encounter (HOSPITAL_COMMUNITY)
Admission: RE | Admit: 2018-07-17 | Discharge: 2018-07-17 | Disposition: A | Discharge status: Home / Self Care | Payer: Medicare Other | Source: Ambulatory Visit | Attending: Surgery | Admitting: Surgery

## 2018-07-17 ENCOUNTER — Other Ambulatory Visit (HOSPITAL_COMMUNITY): Payer: Medicare Other

## 2018-07-17 ENCOUNTER — Ambulatory Visit (HOSPITAL_COMMUNITY)
Admission: RE | Admit: 2018-07-17 | Discharge: 2018-07-17 | Disposition: A | Discharge status: Home / Self Care | Payer: Medicare Other | Source: Ambulatory Visit | Attending: Surgery | Admitting: Surgery

## 2018-07-17 DIAGNOSIS — E041 Nontoxic single thyroid nodule: Secondary | ICD-10-CM | POA: Diagnosis not present

## 2018-07-17 DIAGNOSIS — E210 Primary hyperparathyroidism: Secondary | ICD-10-CM | POA: Diagnosis not present

## 2018-07-17 DIAGNOSIS — Z923 Personal history of irradiation: Secondary | ICD-10-CM | POA: Insufficient documentation

## 2018-07-17 DIAGNOSIS — E039 Hypothyroidism, unspecified: Secondary | ICD-10-CM | POA: Diagnosis present

## 2018-07-17 MED ORDER — TECHNETIUM TC 99M SESTAMIBI GENERIC - CARDIOLITE
24.9000 | Freq: Once | INTRAVENOUS | Status: AC | PRN
  Administered 2018-07-17: 24.9 via INTRAVENOUS

## 2018-07-19 ENCOUNTER — Other Ambulatory Visit (HOSPITAL_COMMUNITY): Payer: Medicare Other

## 2018-07-19 ENCOUNTER — Encounter (HOSPITAL_COMMUNITY): Payer: Medicare Other

## 2018-07-27 ENCOUNTER — Ambulatory Visit: Payer: Self-pay | Admitting: Surgery

## 2018-07-30 ENCOUNTER — Encounter (HOSPITAL_COMMUNITY): Payer: Self-pay | Admitting: *Deleted

## 2018-08-01 ENCOUNTER — Encounter (HOSPITAL_COMMUNITY): Payer: Self-pay

## 2018-08-01 NOTE — Progress Notes (Signed)
Stress test 02-20-18 epic ekg 05-01-18 epic Ct chest 02-08-18 epic

## 2018-08-01 NOTE — Patient Instructions (Signed)
Varina Hulon Tarnowski  08/01/2018   Your procedure is scheduled on: 08-14-18  Report to [facility] Main  Entrance             Report to admitting at     1000 AM    Call this number if you have problems the morning of surgery [phone]    Remember: Do not eat food or drink liquids :After Midnight. BRUSH YOUR TEETH MORNING OF SURGERY AND RINSE YOUR MOUTH OUT, NO CHEWING GUM CANDY OR MINTS.     Take these medicines the morning of surgery with A SIP OF WATER: levothyroxine, zetia, amlodipine, tylenol if needed                                You may not have any metal on your body including hair pins and              piercings  Do not wear jewelry, make-up, lotions, powders or perfumes, deodorant             Do not wear nail polish.  Do not shave  48 hours prior to surgery.     Do not bring valuables to the hospital. [place].  Contacts, dentures or bridgework may not be worn into surgery.      Patients discharged the day of surgery will not be allowed to drive home.  Name and phone number of your driver:  Special Instructions: N/A              Please read over the following fact sheets you were given: _____________________________________________________________________             [facility] - Preparing for Surgery Before surgery, you can play an important role.  Because skin is not sterile, your skin needs to be as free of germs as possible.  You can reduce the number of germs on your skin by washing with CHG (chlorahexidine gluconate) soap before surgery.  CHG is an antiseptic cleaner which kills germs and bonds with the skin to continue killing germs even after washing. Please DO NOT use if you have an allergy to CHG or antibacterial soaps.  If your skin becomes reddened/irritated stop using the CHG and inform your nurse when you arrive at Short Stay. Do not shave (including legs and underarms) for  at least 48 hours prior to the first CHG shower.  You may shave your face/neck. Please follow these instructions carefully:  1.  Shower with CHG Soap the night before surgery and the  morning of Surgery.  2.  If you choose to wash your hair, wash your hair first as usual with your  normal  shampoo.  3.  After you shampoo, rinse your hair and body thoroughly to remove the  shampoo.                           4.  Use CHG as you would any other liquid soap.  You can apply chg directly  to the skin and wash                       Gently with a scrungie or clean washcloth.  5.  Apply the CHG Soap to your body ONLY FROM THE NECK DOWN.   Do not use on face/ open                           Wound or open sores. Avoid contact with eyes, ears mouth and genitals (private parts).                       Wash face,  Genitals (private parts) with your normal soap.             6.  Wash thoroughly, paying special attention to the area where your surgery  will be performed.  7.  Thoroughly rinse your body with warm water from the neck down.  8.  DO NOT shower/wash with your normal soap after using and rinsing off  the CHG Soap.                9.  Pat yourself dry with a clean towel.            10.  Wear clean pajamas.            11.  Place clean sheets on your bed the night of your first shower and do not  sleep with pets. Day of Surgery : Do not apply any lotions/deodorants the morning of surgery.  Please wear clean clothes to the hospital/surgery center.  FAILURE TO FOLLOW THESE INSTRUCTIONS MAY RESULT IN THE CANCELLATION OF YOUR SURGERY PATIENT SIGNATURE_________________________________  NURSE SIGNATURE__________________________________  ________________________________________________________________________

## 2018-08-06 ENCOUNTER — Ambulatory Visit (HOSPITAL_COMMUNITY)
Admission: RE | Admit: 2018-08-06 | Discharge: 2018-08-06 | Disposition: A | Discharge status: Home / Self Care | Payer: Medicare Other | Source: Ambulatory Visit | Attending: Anesthesiology | Admitting: Anesthesiology

## 2018-08-06 ENCOUNTER — Encounter (HOSPITAL_COMMUNITY): Payer: Self-pay

## 2018-08-06 ENCOUNTER — Encounter (HOSPITAL_COMMUNITY)
Admission: RE | Admit: 2018-08-06 | Discharge: 2018-08-06 | Disposition: A | Discharge status: Home / Self Care | Payer: Medicare Other | Source: Ambulatory Visit | Attending: Surgery | Admitting: Surgery

## 2018-08-06 ENCOUNTER — Other Ambulatory Visit: Payer: Self-pay

## 2018-08-06 DIAGNOSIS — Z01818 Encounter for other preprocedural examination: Secondary | ICD-10-CM | POA: Diagnosis present

## 2018-08-06 HISTORY — DX: Pneumonia, unspecified organism: J18.9

## 2018-08-06 HISTORY — DX: Major depressive disorder, single episode, unspecified: F32.9

## 2018-08-06 HISTORY — DX: Nausea with vomiting, unspecified: R11.2

## 2018-08-06 HISTORY — DX: Adverse effect of unspecified anesthetic, initial encounter: T41.45XA

## 2018-08-06 HISTORY — DX: Other specified postprocedural states: Z98.890

## 2018-08-06 HISTORY — DX: Other complications of anesthesia, initial encounter: T88.59XA

## 2018-08-06 HISTORY — DX: Depression, unspecified: F32.A

## 2018-08-06 HISTORY — DX: Dyspnea, unspecified: R06.00

## 2018-08-06 HISTORY — DX: Anemia, unspecified: D64.9

## 2018-08-06 HISTORY — DX: Anxiety disorder, unspecified: F41.9

## 2018-08-06 LAB — BASIC METABOLIC PANEL
ANION GAP: 7 (ref 5–15)
BUN: 20 mg/dL (ref 8–23)
CALCIUM: 10.6 mg/dL — AB (ref 8.9–10.3)
CO2: 27 mmol/L (ref 22–32)
Chloride: 105 mmol/L (ref 98–111)
Creatinine, Ser: 0.94 mg/dL (ref 0.44–1.00)
GFR, EST NON AFRICAN AMERICAN: 57 mL/min — AB (ref >60)
GLUCOSE: 94 mg/dL (ref 70–99)
Potassium: 4.2 mmol/L (ref 3.5–5.1)
SODIUM: 139 mmol/L (ref 135–145)

## 2018-08-06 LAB — CBC
HCT: 41 % (ref 36.0–46.0)
Hemoglobin: 13.3 g/dL (ref 12.0–15.0)
MCH: 31.1 pg (ref 26.0–34.0)
MCHC: 32.4 g/dL (ref 30.0–36.0)
MCV: 95.8 fL (ref 80.0–100.0)
PLATELETS: 258 10*3/uL (ref 150–400)
RBC: 4.28 MIL/uL (ref 3.87–5.11)
RDW: 12.9 % (ref 11.5–15.5)
WBC: 8.1 10*3/uL (ref 4.0–10.5)
nRBC: 0 % (ref 0.0–0.2)

## 2018-08-06 NOTE — Progress Notes (Signed)
Per Dr. Marcie Bal anesthesia pt. Clearance from cardiology 02-15-18 is acceptable clearance for 08-14-18 surgery with Dr. Harlow Asa if pt. Has had no cardiac issues since workup. Stress test 02-20-18 in epic also. Pt. Reports at preop appt. no cardiac related issues since last cleared for surgery in May 2019.

## 2018-08-13 ENCOUNTER — Encounter (HOSPITAL_COMMUNITY): Payer: Self-pay | Admitting: Surgery

## 2018-08-13 NOTE — H&P (Signed)
General Surgery [facility]  Leonie Man Fritze Documented: 07/27/2018 3:17 PM Location: [facility] Patient #: [id number] DOB: 06-Jan-1941 Married / Language: Cleophus Molt / Race: White Female   History of Present Illness Earnstine Regal MD; 07/27/2018 4:09 PM) The patient is a 77 year old female who presents with primary hyperparathyroidism.  CHIEF COMPLAINT: primary hyperparathyroidism  Patient returns for follow-up of primary hyperparathyroidism. At my request she underwent diagnostic studies including a nuclear medicine parathyroid scan and an ultrasound examination of the neck. 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 This was confirmed on ultrasound examination with a 1.3 x 0.9 x 0.6 cm nodule in the inferior left thyroid bed. Patient presents today accompanied by her husband to discuss these results and make plans for surgery.   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 Codeine/Codeine Derivatives  Latex  Lanolin  Itching. Statins  Cephalexin *CEPHALOSPORINS*  Itching, Rash. Cipro *FLUOROQUINOLONES*  Rash. Clindamycin  Rash. Erythromycin *MACROLIDES*  Itching, Rash. Fish  Penicillins  Itching, Rash. MiraLax *LAXATIVES*  Itching, Rash. Tetracyclines  Itching, Rash.  Medication History  Mammie Lorenzo, LPN; 76/73/4193 7:90 PM) Clobetasol Propionate (0.05% Solution, External) Active. Synthroid (75MCG Tablet, Oral) Active. amLODIPine Besylate (5MG  Tablet, Oral) Active. Aspirin (81MG  Tablet, Oral) Active. Atorvastatin Calcium (10MG  Tablet, Oral) Active. Vitamin D (2000UNIT Capsule, Oral) Active. Co Q-10 (300MG  Capsule, Oral) Active. Ezetimibe (10MG  Tablet, Oral) Active. EpiPen (0.3MG /0.3ML Soln Auto-inj, Injection) Active. Eye Vitamins & Minerals (Oral) Active. Nitrostat (0.4MG  Tab Sublingual, Sublingual) Active. Desoximetasone (0.25% Ointment, External) Active. Desonide (0.05% Cream, External) Active. Medications Reconciled  Social History Earnstine Regal, MD; 07/27/2018 4:11 PM) Alcohol use  Occasional alcohol use. Caffeine use  Coffee, Tea. No drug use  Tobacco use  Never smoker.  Family History Earnstine Regal, MD; 07/27/2018 4:11 PM) Cerebrovascular Accident  Father. Colon Cancer  Mother. Colon Polyps  Mother. Heart Disease  Father, Mother. Malignant Neoplasm Of Pancreas  Mother.  Pregnancy / Birth History Earnstine Regal, MD; 07/27/2018 4:11 PM) Age at menarche  89 years. Age of menopause  51-55 Contraceptive History  Oral contraceptives. Gravida  3 Length (months) of breastfeeding  7-12 Maternal age  49-25 Para  2  Other Problems Earnstine Regal, MD; 07/27/2018 4:11 PM) Anxiety Disorder  Back Pain  Cerebrovascular Accident  Chest pain  Congestive Heart Failure  Depression  Gastroesophageal Reflux Disease  General anesthesia - complications  High blood pressure  Hypercholesterolemia  Myocardial infarction  Thyroid Disease   Vitals Claiborne Billings Dockery LPN; 24/06/7352 2:99 PM) 07/27/2018 3:23 PM Weight: 150.8 lb Height: 64in Body Surface Area: 1.73 m Body Mass Index: 25.88 kg/m  Temp.: 97.50F(Temporal)  Pulse: 99 (Regular)  BP: 122/62 (Sitting, Right Arm, Standard)       Physical  Exam Earnstine Regal MD; 07/27/2018 4:09 PM) The physical exam findings are as follows: Note:See vital signs recorded above  GENERAL APPEARANCE Development: normal Nutritional status: normal  Gross deformities: none  SKIN Rash, lesions, ulcers: none Induration, erythema: none Nodules: none palpable  EYES Conjunctiva and lids: normal Pupils: equal and reactive Iris: normal bilaterally  EARS, NOSE, MOUTH, THROAT External ears: no lesion or deformity External nose: no lesion or deformity Hearing: grossly normal Lips: no lesion or deformity Dentition: normal for age Oral mucosa: moist  NECK Symmetric: yes Trachea: midline Thyroid: no palpable nodules in the thyroid bed  CHEST Respiratory effort: normal Retraction or accessory muscle use: no Breath sounds: normal bilaterally Rales, rhonchi, wheeze: none  CARDIOVASCULAR Auscultation: regular rhythm, normal rate Murmurs: none Pulses: carotid and radial pulse 2+ palpable Lower extremity edema: none Lower extremity varicosities: none  MUSCULOSKELETAL Station and gait: normal Digits and nails: no clubbing or cyanosis Muscle strength: grossly normal all extremities Range of motion: grossly normal all extremities Deformity: none  LYMPHATIC Cervical: none palpable Supraclavicular: none palpable  PSYCHIATRIC Oriented to person, place, and time: yes Mood and affect: normal for situation Judgment and insight: appropriate for situation    Assessment & Plan Earnstine Regal MD; 07/27/2018 4:11 PM) PRIMARY HYPERPARATHYROIDISM (E21.0) Current Plans Patient returns today accompanied by her husband to review the results of her nuclear medicine parathyroid scan and her ultrasound. Both of these studies indicate the presence of a left inferior parathyroid adenoma.  We discussed minimally invasive parathyroid surgery. We discussed the alternative procedure which would be a 4 gland exploration. We discussed the risk and  benefits of surgery including the risk of injury to recurrent laryngeal nerves. I have recommended proceeding with a left inferior minimally invasive parathyroidectomy as an outpatient surgical procedure. We will make arrangements for her surgery in the near future.  The risks and benefits of the procedure have been discussed at length with the patient. The patient understands the proposed procedure, potential alternative treatments, and the course of recovery to be expected. All of the patient's questions have been answered at this time. The patient wishes to proceed with surgery.  Armandina Gemma, [facility] Office: [phone]

## 2018-08-14 ENCOUNTER — Encounter (HOSPITAL_COMMUNITY)
Admission: RE | Disposition: A | Discharge status: Home / Self Care | Payer: Self-pay | Source: Ambulatory Visit | Attending: Surgery

## 2018-08-14 ENCOUNTER — Encounter (HOSPITAL_COMMUNITY): Payer: Self-pay

## 2018-08-14 ENCOUNTER — Other Ambulatory Visit: Payer: Self-pay

## 2018-08-14 ENCOUNTER — Ambulatory Visit (HOSPITAL_COMMUNITY): Payer: Medicare Other | Admitting: Anesthesiology

## 2018-08-14 ENCOUNTER — Ambulatory Visit (HOSPITAL_COMMUNITY)
Admission: RE | Admit: 2018-08-14 | Discharge: 2018-08-14 | Disposition: A | Discharge status: Home / Self Care | Payer: Medicare Other | Source: Ambulatory Visit | Attending: Surgery | Admitting: Surgery

## 2018-08-14 DIAGNOSIS — E210 Primary hyperparathyroidism: Secondary | ICD-10-CM | POA: Diagnosis not present

## 2018-08-14 DIAGNOSIS — Z8639 Personal history of other endocrine, nutritional and metabolic disease: Secondary | ICD-10-CM | POA: Diagnosis present

## 2018-08-14 DIAGNOSIS — Z7982 Long term (current) use of aspirin: Secondary | ICD-10-CM | POA: Insufficient documentation

## 2018-08-14 DIAGNOSIS — F419 Anxiety disorder, unspecified: Secondary | ICD-10-CM | POA: Diagnosis not present

## 2018-08-14 DIAGNOSIS — I2510 Atherosclerotic heart disease of native coronary artery without angina pectoris: Secondary | ICD-10-CM | POA: Diagnosis not present

## 2018-08-14 DIAGNOSIS — I509 Heart failure, unspecified: Secondary | ICD-10-CM | POA: Diagnosis not present

## 2018-08-14 DIAGNOSIS — E039 Hypothyroidism, unspecified: Secondary | ICD-10-CM | POA: Diagnosis not present

## 2018-08-14 DIAGNOSIS — I252 Old myocardial infarction: Secondary | ICD-10-CM | POA: Insufficient documentation

## 2018-08-14 DIAGNOSIS — K219 Gastro-esophageal reflux disease without esophagitis: Secondary | ICD-10-CM | POA: Insufficient documentation

## 2018-08-14 DIAGNOSIS — D351 Benign neoplasm of parathyroid gland: Secondary | ICD-10-CM | POA: Diagnosis not present

## 2018-08-14 DIAGNOSIS — F329 Major depressive disorder, single episode, unspecified: Secondary | ICD-10-CM | POA: Insufficient documentation

## 2018-08-14 DIAGNOSIS — E7800 Pure hypercholesterolemia, unspecified: Secondary | ICD-10-CM | POA: Diagnosis not present

## 2018-08-14 DIAGNOSIS — Z79899 Other long term (current) drug therapy: Secondary | ICD-10-CM | POA: Insufficient documentation

## 2018-08-14 DIAGNOSIS — I110 Hypertensive heart disease with heart failure: Secondary | ICD-10-CM | POA: Diagnosis not present

## 2018-08-14 HISTORY — PX: PARATHYROIDECTOMY: SHX19

## 2018-08-14 SURGERY — PARATHYROIDECTOMY
Anesthesia: General | Laterality: Left

## 2018-08-14 MED ORDER — ROCURONIUM BROMIDE 10 MG/ML (PF) SYRINGE
PREFILLED_SYRINGE | INTRAVENOUS | Status: AC
  Filled 2018-08-14: qty 10

## 2018-08-14 MED ORDER — LACTATED RINGERS IV SOLN
INTRAVENOUS | Status: DC
  Administered 2018-08-14: 11:00:00 via INTRAVENOUS

## 2018-08-14 MED ORDER — 0.9 % SODIUM CHLORIDE (POUR BTL) OPTIME
TOPICAL | Status: DC | PRN
  Administered 2018-08-14: 1000 mL

## 2018-08-14 MED ORDER — HYDROCODONE-ACETAMINOPHEN 5-325 MG PO TABS: 1.0000 | ORAL_TABLET | ORAL | 0 refills | Status: DC | PRN

## 2018-08-14 MED ORDER — SUGAMMADEX SODIUM 200 MG/2ML IV SOLN
INTRAVENOUS | Status: AC
  Filled 2018-08-14: qty 2

## 2018-08-14 MED ORDER — PROPOFOL 10 MG/ML IV BOLUS
INTRAVENOUS | Status: AC
  Filled 2018-08-14: qty 20

## 2018-08-14 MED ORDER — ONDANSETRON HCL 4 MG/2ML IJ SOLN: 4.0000 mg | Freq: Once | INTRAMUSCULAR | Status: DC | PRN

## 2018-08-14 MED ORDER — FENTANYL CITRATE (PF) 100 MCG/2ML IJ SOLN
INTRAMUSCULAR | Status: AC
  Filled 2018-08-14: qty 4

## 2018-08-14 MED ORDER — LIDOCAINE HCL (CARDIAC) PF 100 MG/5ML IV SOSY
PREFILLED_SYRINGE | INTRAVENOUS | Status: DC | PRN
  Administered 2018-08-14: 30 mg via INTRAVENOUS

## 2018-08-14 MED ORDER — CHLORHEXIDINE GLUCONATE CLOTH 2 % EX PADS: 6.0000 | MEDICATED_PAD | Freq: Once | CUTANEOUS | Status: DC

## 2018-08-14 MED ORDER — ROCURONIUM BROMIDE 100 MG/10ML IV SOLN
INTRAVENOUS | Status: DC | PRN
  Administered 2018-08-14: 50 mg via INTRAVENOUS

## 2018-08-14 MED ORDER — SUGAMMADEX SODIUM 200 MG/2ML IV SOLN
INTRAVENOUS | Status: DC | PRN
  Administered 2018-08-14: 200 mg via INTRAVENOUS

## 2018-08-14 MED ORDER — BUPIVACAINE HCL (PF) 0.25 % IJ SOLN
INTRAMUSCULAR | Status: AC
  Filled 2018-08-14: qty 30

## 2018-08-14 MED ORDER — LIDOCAINE 2% (20 MG/ML) 5 ML SYRINGE
INTRAMUSCULAR | Status: AC
  Filled 2018-08-14: qty 5

## 2018-08-14 MED ORDER — VANCOMYCIN HCL IN DEXTROSE 1-5 GM/200ML-% IV SOLN
1000.0000 mg | INTRAVENOUS | Status: AC
  Administered 2018-08-14: 1000 mg via INTRAVENOUS
  Filled 2018-08-14: qty 200

## 2018-08-14 MED ORDER — PROPOFOL 10 MG/ML IV BOLUS
INTRAVENOUS | Status: DC | PRN
  Administered 2018-08-14: 100 mg via INTRAVENOUS

## 2018-08-14 MED ORDER — FENTANYL CITRATE (PF) 100 MCG/2ML IJ SOLN
25.0000 ug | INTRAMUSCULAR | Status: DC | PRN
  Administered 2018-08-14 (×2): 50 ug via INTRAVENOUS

## 2018-08-14 MED ORDER — FENTANYL CITRATE (PF) 100 MCG/2ML IJ SOLN
INTRAMUSCULAR | Status: DC | PRN
  Administered 2018-08-14: 25 ug via INTRAVENOUS
  Administered 2018-08-14: 50 ug via INTRAVENOUS
  Administered 2018-08-14 (×3): 25 ug via INTRAVENOUS
  Administered 2018-08-14: 50 ug via INTRAVENOUS

## 2018-08-14 MED ORDER — FENTANYL CITRATE (PF) 250 MCG/5ML IJ SOLN
INTRAMUSCULAR | Status: AC
  Filled 2018-08-14: qty 5

## 2018-08-14 MED ORDER — BUPIVACAINE HCL 0.25 % IJ SOLN
INTRAMUSCULAR | Status: DC | PRN
  Administered 2018-08-14: 10 mL

## 2018-08-14 SURGICAL SUPPLY — 35 items
ADH SKN CLS APL DERMABOND .7 (GAUZE/BANDAGES/DRESSINGS) ×1
ATTRACTOMAT 16X20 MAGNETIC DRP (DRAPES) ×3 IMPLANT
BLADE SURG 15 STRL LF DISP TIS (BLADE) ×1 IMPLANT
BLADE SURG 15 STRL SS (BLADE) ×3
CHLORAPREP W/TINT 26ML (MISCELLANEOUS) ×3 IMPLANT
CLIP VESOCCLUDE MED 6/CT (CLIP) ×6 IMPLANT
CLIP VESOCCLUDE SM WIDE 6/CT (CLIP) ×6 IMPLANT
CLOSURE WOUND 1/2 X4 (GAUZE/BANDAGES/DRESSINGS)
COVER SURGICAL LIGHT HANDLE (MISCELLANEOUS) ×3 IMPLANT
COVER WAND RF STERILE (DRAPES) IMPLANT
DERMABOND ADVANCED (GAUZE/BANDAGES/DRESSINGS) ×2
DERMABOND ADVANCED .7 DNX12 (GAUZE/BANDAGES/DRESSINGS) ×1 IMPLANT
DRAPE LAPAROTOMY T 98X78 PEDS (DRAPES) ×3 IMPLANT
ELECT PENCIL ROCKER SW 15FT (MISCELLANEOUS) ×3 IMPLANT
ELECT REM PT RETURN 15FT ADLT (MISCELLANEOUS) ×3 IMPLANT
GAUZE 4X4 16PLY RFD (DISPOSABLE) ×3 IMPLANT
GLOVE SURG ORTHO 8.0 STRL STRW (GLOVE) ×3 IMPLANT
GOWN STRL REUS W/TWL XL LVL3 (GOWN DISPOSABLE) ×9 IMPLANT
HEMOSTAT SURGICEL 2X4 FIBR (HEMOSTASIS) ×3 IMPLANT
ILLUMINATOR WAVEGUIDE N/F (MISCELLANEOUS) IMPLANT
KIT BASIN OR (CUSTOM PROCEDURE TRAY) ×3 IMPLANT
LIGHT WAVEGUIDE WIDE FLAT (MISCELLANEOUS) IMPLANT
NEEDLE HYPO 25X1 1.5 SAFETY (NEEDLE) ×3 IMPLANT
PACK BASIC VI WITH GOWN DISP (CUSTOM PROCEDURE TRAY) ×3 IMPLANT
POWDER SURGICEL 3.0 GRAM (HEMOSTASIS) IMPLANT
STRIP CLOSURE SKIN 1/2X4 (GAUZE/BANDAGES/DRESSINGS) IMPLANT
SUT MNCRL AB 4-0 PS2 18 (SUTURE) ×3 IMPLANT
SUT SILK 2 0 (SUTURE)
SUT SILK 2-0 18XBRD TIE 12 (SUTURE) IMPLANT
SUT VIC AB 3-0 SH 18 (SUTURE) ×3 IMPLANT
SYR BULB IRRIGATION 50ML (SYRINGE) ×3 IMPLANT
SYR CONTROL 10ML LL (SYRINGE) ×3 IMPLANT
TOWEL OR 17X26 10 PK STRL BLUE (TOWEL DISPOSABLE) ×3 IMPLANT
TOWEL OR NON WOVEN STRL DISP B (DISPOSABLE) ×3 IMPLANT
YANKAUER SUCT BULB TIP 10FT TU (MISCELLANEOUS) ×3 IMPLANT

## 2018-08-14 NOTE — Anesthesia Preprocedure Evaluation (Signed)
Anesthesia Evaluation  Patient identified by MRN, date of birth, ID band Patient awake    Reviewed: Allergy & Precautions, NPO status , Patient's Chart, lab work & pertinent test results  History of Anesthesia Complications (+) PONV and history of anesthetic complications  Airway Mallampati: II  TM Distance: >3 FB Neck ROM: Full    Dental  (+) Dental Advisory Given, Missing   Pulmonary neg pulmonary ROS,    Pulmonary exam normal breath sounds clear to auscultation       Cardiovascular hypertension, Pt. on medications + CAD and + Past MI  Normal cardiovascular exam Rhythm:Regular Rate:Normal  Stress test 02/20/18: No ischemia. This is reassuring considering her known LAD stenosis. LV function is normal with LVEF 75%. If no new symptoms she can proceed with surgery at low cardiac risk.    Neuro/Psych PSYCHIATRIC DISORDERS Anxiety Depression  Neuromuscular disease    GI/Hepatic negative GI ROS, Neg liver ROS,   Endo/Other  Hypothyroidism PRIMARY HYPERPARATHYROIDISM  Renal/GU negative Renal ROS     Musculoskeletal negative musculoskeletal ROS (+)   Abdominal   Peds  Hematology negative hematology ROS (+)   Anesthesia Other Findings Day of surgery medications reviewed with the patient.  Reproductive/Obstetrics                             Anesthesia Physical Anesthesia Plan  ASA: III  Anesthesia Plan: General   Post-op Pain Management:    Induction: Intravenous  PONV Risk Score and Plan: 4 or greater and Treatment may vary due to age or medical condition, Dexamethasone, Ondansetron and Propofol infusion  Airway Management Planned: Oral ETT  Additional Equipment:   Intra-op Plan:   Post-operative Plan: Extubation in OR  Informed Consent: I have reviewed the patients History and Physical, chart, labs and discussed the procedure including the risks, benefits and alternatives for the  proposed anesthesia with the patient or authorized representative who has indicated his/her understanding and acceptance.   Dental advisory given  Plan Discussed with: CRNA  Anesthesia Plan Comments:         Anesthesia Quick Evaluation

## 2018-08-14 NOTE — Interval H&P Note (Signed)
History and Physical Interval Note:  08/14/2018 12:05 PM  Victoria [name]  has presented today for surgery, with the diagnosis of PRIMARY HYPERPARATHYROIDISM.  The various methods of treatment have been discussed with the patient and family. After consideration of risks, benefits and other options for treatment, the patient has consented to    Procedure(s): LEFT INFERIOR PARATHYROIDECTOMY (Left) as a surgical intervention .    The patient's history has been reviewed, patient examined, no change in status, stable for surgery.  I have reviewed the patient's chart and labs.  Questions were answered to the patient's satisfaction.    Armandina Gemma, [place] [facility] Office: [place]

## 2018-08-14 NOTE — Anesthesia Postprocedure Evaluation (Signed)
Anesthesia Post Note  Patient: Victoria [name]  Procedure(s) Performed: LEFT INFERIOR PARATHYROIDECTOMY (Left )     Patient location during evaluation: PACU Anesthesia Type: General Level of consciousness: awake and alert Pain management: pain level controlled Vital Signs Assessment: post-procedure vital signs reviewed and stable Respiratory status: spontaneous breathing, nonlabored ventilation and respiratory function stable Cardiovascular status: blood pressure returned to baseline and stable Postop Assessment: no apparent nausea or vomiting Anesthetic complications: no    Last Vitals:  Vitals:   08/14/18 1430 08/14/18 1500  BP: 137/86 (!) 151/76  Pulse: 83 86  Resp: 11 12  Temp: 36.6 C (!) 36.3 C  SpO2: 100% 96%    Last Pain:  Vitals:   08/14/18 1500  TempSrc: Oral  PainSc: 0-No pain                 Catalina Gravel

## 2018-08-14 NOTE — Anesthesia Procedure Notes (Signed)
Procedure Name: Intubation Date/Time: 08/14/2018 12:36 PM Performed by: Garrel Ridgel, CRNA Pre-anesthesia Checklist: Patient identified, Emergency Drugs available, Suction available, Patient being monitored and Timeout performed Patient Re-evaluated:Patient Re-evaluated prior to induction Oxygen Delivery Method: Circle system utilized Preoxygenation: Pre-oxygenation with 100% oxygen Induction Type: IV induction Ventilation: Mask ventilation without difficulty LMA: LMA inserted Laryngoscope Size: Mac and 3 Grade View: Grade II Tube type: Oral Tube size: 7.0 mm Number of attempts: 1 Airway Equipment and Method: Stylet Placement Confirmation: ETT inserted through vocal cords under direct vision Secured at: 21 cm Tube secured with: Tape Dental Injury: Teeth and Oropharynx as per pre-operative assessment

## 2018-08-14 NOTE — Transfer of Care (Signed)
Immediate Anesthesia Transfer of Care Note  Patient: Victoria [name]  Procedure(s) Performed: LEFT INFERIOR PARATHYROIDECTOMY (Left )  Patient Location: PACU  Anesthesia Type:General  Level of Consciousness: awake, alert  and oriented  Airway & Oxygen Therapy: Patient Spontanous Breathing and Patient connected to face mask oxygen  Post-op Assessment: Report given to RN and Post -op Vital signs reviewed and stable  Post vital signs: Reviewed and stable  Last Vitals:  Vitals Value Taken Time  BP 156/91 08/14/2018  1:38 PM  Temp    Pulse 88 08/14/2018  1:39 PM  Resp 10 08/14/2018  1:39 PM  SpO2 100 % 08/14/2018  1:39 PM  Vitals shown include unvalidated device data.  Last Pain:  Vitals:   08/14/18 1100  TempSrc:   PainSc: 0-No pain         Complications: No apparent anesthesia complications

## 2018-08-14 NOTE — Discharge Instructions (Signed)
Parathyroidectomy, Care After Refer to this sheet in the next few weeks. These instructions provide you with information on caring for yourself after your procedure. Your health care provider may also give you more specific instructions. Your treatment has been planned according to current medical practices, but problems sometimes occur. Call your health care provider if you have any problems or questions after your procedure. What can I expect after the procedure? After your procedure, it is typical to have the following:  Tingling or numbness around your mouth or in your fingers or toes.  Temporary hoarseness.  Follow these instructions at home:  Take medicines only as directed by your health care provider.  There are many different ways to close and cover an incision, including stitches, skin glue, and adhesive strips. Follow your health care provider's instructions on: ? Incision care. ? Bandage (dressing) changes and removal. ? Incision closure removal.  Do not take baths, swim, or use a hot tub until your health care provider approves.  Follow your health care providers instructions for eating and drinking. You may need to consume only liquids and soft foods for a day after the procedure.  Rest for the first few days as you heal from the surgery. It may take that long before you can resume all of your usual activities.  Keep all follow-up visits as directed by your health care provider. This is important. Your health care provider needs to monitor the calcium level in your blood to make sure that it does not become low. Contact a health care provider if:  You have tingling or numbness in your lips, toes, or fingers that does not go away within a few days.  You have drainage, redness, swelling, or pain at your incision site.  You have trouble talking.  You have nausea or vomiting for more than 2 days.  You have a fever. Get help right away if: You have trouble breathing.    This information is not intended to replace advice given to you by your health care provider. Make sure you discuss any questions you have with your health care provider. Document Released: 04/16/2014 Document Revised: 05/22/2016 Document Reviewed: 02/12/2014 Elsevier Interactive Patient Education  2018 [place] Anesthesia, Adult, Care After These instructions provide you with information about caring for yourself after your procedure. Your health care provider may also give you more specific instructions. Your treatment has been planned according to current medical practices, but problems sometimes occur. Call your health care provider if you have any problems or questions after your procedure. What can I expect after the procedure? After the procedure, it is common to have:  Vomiting.  A sore throat.  Mental slowness.  It is common to feel:  Nauseous.  Cold or shivery.  Sleepy.  Tired.  Sore or achy, even in parts of your body where you did not have surgery.  Follow these instructions at home: For at least 24 hours after the procedure:  Do not: ? Participate in activities where you could fall or become injured. ? Drive. ? Use heavy machinery. ? Drink alcohol. ? Take sleeping pills or medicines that cause drowsiness. ? Make important decisions or sign legal documents. ? Take care of children on your own.  Rest. Eating and drinking  If you vomit, drink water, juice, or soup when you can drink without vomiting.  Drink enough fluid to keep your urine clear or pale yellow.  Make sure you have little or no nausea before eating  solid foods.  Follow the diet recommended by your health care provider. General instructions  Have a responsible adult stay with you until you are awake and alert.  Return to your normal activities as told by your health care provider. Ask your health care provider what activities are safe for you.  Take over-the-counter and  prescription medicines only as told by your health care provider.  If you smoke, do not smoke without supervision.  Keep all follow-up visits as told by your health care provider. This is important. Contact a health care provider if:  You continue to have nausea or vomiting at home, and medicines are not helpful.  You cannot drink fluids or start eating again.  You cannot urinate after 8-12 hours.  You develop a skin rash.  You have fever.  You have increasing redness at the site of your procedure. Get help right away if:  You have difficulty breathing.  You have chest pain.  You have unexpected bleeding.  You feel that you are having a life-threatening or urgent problem. This information is not intended to replace advice given to you by your health care provider. Make sure you discuss any questions you have with your health care provider. Document Released: 12/26/2000 Document Revised: 02/22/2016 Document Reviewed: 09/03/2015 Elsevier Interactive Patient Education  [employer].

## 2018-08-14 NOTE — Op Note (Signed)
OPERATIVE REPORT - PARATHYROIDECTOMY  Preoperative diagnosis: Primary hyperparathyroidism  Postop diagnosis: Same  Procedure: Left inferior minimally invasive parathyroidectomy  Surgeon:  Armandina Gemma, MD  Anesthesia: General endotracheal  Estimated blood loss: Minimal  Preparation: ChloraPrep  Indications: Patient returns for follow-up of primary hyperparathyroidism. At my request she underwent diagnostic studies including a nuclear medicine parathyroid scan and an ultrasound examination of the neck. 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 This was confirmed on ultrasound examination with a 1.3 x 0.9 x 0.6 cm nodule in the inferior left thyroid bed.  Patient now comes to surgery for minimally invasive parathyroidectomy.  Procedure: The patient was prepared in the pre-operative holding area. The patient was brought to the operating room and placed in a supine position on the operating room table. Following administration of general anesthesia, the patient was positioned and then prepped and draped in the usual strict aseptic fashion. After ascertaining that an adequate level of anesthesia been achieved, a neck incision was made with a #15 blade. Dissection was carried through subcutaneous tissues and platysma. Hemostasis was obtained with the electrocautery. Skin flaps were developed circumferentially and a Weitlander retractor was placed for exposure.  Strap muscles were incised in the midline. Strap muscles were reflected lateralley exposing the thyroid lobe. With gentle blunt dissection the thyroid lobe was mobilized.  Dissection was carried through adipose tissue and an enlarged parathyroid gland was identified. It was gently mobilized. Vascular structures were divided between small ligaclips. Care was taken to avoid the recurrent laryngeal nerve and the esophagus. The parathyroid gland was completely excised. It was submitted to  pathology where frozen section confirmed parathyroid tissue consistent with adenoma.  Neck was irrigated with warm saline and good hemostasis was noted. Fibrillar was placed in the operative field. Strap muscles were reapproximated in the midline with interrupted 3-0 Vicryl sutures. Platysma was closed with interrupted 3-0 Vicryl sutures. Marcaine was infiltrated circumferentially. Skin was closed with a running 4-0 Monocryl subcuticular suture. Wound was washed and dried and Dermabond was applied. Patient was awakened from anesthesia and brought to the recovery room. The patient tolerated the procedure well.   Armandina Gemma, MD [facility] Office: [phone]

## 2018-08-15 ENCOUNTER — Encounter (HOSPITAL_COMMUNITY): Payer: Self-pay | Admitting: Surgery

## 2018-08-17 ENCOUNTER — Ambulatory Visit: Payer: Medicare Other | Admitting: Family Medicine

## 2018-08-21 ENCOUNTER — Ambulatory Visit: Payer: Medicare Other | Admitting: Family Medicine

## 2018-08-27 DIAGNOSIS — E210 Primary hyperparathyroidism: Secondary | ICD-10-CM | POA: Diagnosis not present

## 2018-09-22 ENCOUNTER — Other Ambulatory Visit: Payer: Self-pay | Admitting: Cardiovascular Disease

## 2018-09-22 DIAGNOSIS — I25118 Atherosclerotic heart disease of native coronary artery with other forms of angina pectoris: Secondary | ICD-10-CM

## 2018-09-22 DIAGNOSIS — I252 Old myocardial infarction: Secondary | ICD-10-CM

## 2018-10-09 ENCOUNTER — Other Ambulatory Visit: Payer: Self-pay | Admitting: Neurosurgery

## 2018-10-09 DIAGNOSIS — M5416 Radiculopathy, lumbar region: Secondary | ICD-10-CM

## 2018-10-20 ENCOUNTER — Ambulatory Visit
Admission: RE | Admit: 2018-10-20 | Discharge: 2018-10-20 | Disposition: A | Discharge status: Home / Self Care | Payer: Medicare Other | Source: Ambulatory Visit | Attending: Neurosurgery | Admitting: Neurosurgery

## 2018-10-20 DIAGNOSIS — M5416 Radiculopathy, lumbar region: Secondary | ICD-10-CM

## 2018-10-20 MED ORDER — GADOBENATE DIMEGLUMINE 529 MG/ML IV SOLN
14.0000 mL | Freq: Once | INTRAVENOUS | Status: AC | PRN
  Administered 2018-10-20: 14 mL via INTRAVENOUS

## 2018-10-23 ENCOUNTER — Encounter: Payer: Self-pay | Admitting: Internal Medicine

## 2018-10-23 ENCOUNTER — Ambulatory Visit: Payer: Medicare Other | Admitting: Internal Medicine

## 2018-10-23 VITALS — BP 130/60 | HR 85 | Ht 63.5 in | Wt 154.0 lb

## 2018-10-23 DIAGNOSIS — E559 Vitamin D deficiency, unspecified: Secondary | ICD-10-CM

## 2018-10-23 DIAGNOSIS — E213 Hyperparathyroidism, unspecified: Secondary | ICD-10-CM | POA: Diagnosis not present

## 2018-10-23 DIAGNOSIS — M810 Age-related osteoporosis without current pathological fracture: Secondary | ICD-10-CM

## 2018-10-23 LAB — VITAMIN D 25 HYDROXY (VIT D DEFICIENCY, FRACTURES): VITD: 43.92 ng/mL (ref 30.00–100.00)

## 2018-10-23 NOTE — Progress Notes (Signed)
Patient ID: Victoria [name], female   DOB: 02/12/1941, 78 y.o.   MRN: [id number]   HPI  Victoria [name] is a 78 y.o.-year-old female, returning for follow-up for hypercalcemia/hyperparathyroidism.  Last visit 4 months ago.  At last visit we diagnosed primary hyperparathyroidism.  I referred her to surgery.  08/14/2018: left inferior parathyroidectomy by Dr. Harlow Asa.  Pathology showed a 0.4 g hypercellular parathyroid, enlarged, measuring 1.2 x 1.2 x 0.5 cm.  She developed redness at the incision site which resolved with Bactrim.  Postop PTH and calcium decreased to normal.  She will prepare for lower back surgery soon. Also, for cataract sx.  She was diagnosed with hypercalcemia at least since 2015.  Reviewed pertinent labs: Postop labs: PTH 53, calcium 9.7 Lab Results  Component Value Date   PTH 71 (H) 06/27/2018   PTH 135 (H) 05/11/2018   PTH 62.4 04/07/2009   CALCIUM 10.6 (H) 08/06/2018   CALCIUM 11.0 (H) 06/27/2018   CALCIUM 10.8 (H) 05/11/2018   CALCIUM 11.0 (H) 05/11/2018   CALCIUM 10.7 (H) 05/03/2018   CALCIUM 10.3 06/08/2017   CALCIUM 10.9 (H) 04/13/2017   CALCIUM 11.0 (H) 05/17/2016   CALCIUM 10.6 (H) 01/09/2015   CALCIUM 10.6 (H) 12/06/2013  03/25/2011: Calcium 10.1 (8.6-10.2) 07/22/2010, calcium 10 (8.6-10.2)  Labs reviewed from last visit: High calcitriol, normal phosphorus and magnesium: Component     Latest Ref Rng & Units 06/27/2018          Vitamin D 1, 25 (OH) Total     18 - 72 pg/mL 84 (H)  Vitamin D3 1, 25 (OH)     pg/mL 84  Vitamin D2 1, 25 (OH)     pg/mL <8  Phosphorus     2.3 - 4.6 mg/dL 2.9  Magnesium     1.5 - 2.5 mg/dL 2.3   Urine calcium was elevated: Component     Latest Ref Rng & Units 07/04/2018          Creatinine, 24H Ur     0.50 - 2.15 g/24 h 0.88  Calcium, 24H Urine     mg/24 h 273 (H)    Reviewed pt's latest DXA scan report >> osteoporosis: 08/07/2017 Lumbar spine L1-L4 Femoral neck (FN)  T-score -2.7 RFN: -3.1 LFN:  -3.1  Change in BMD from previous DXA test from 2015 (%) -3.4%* -7.9%*  (*) statistically significant  No fractures or falls.   She was doing weightbearing exercises 2-3 times a week, but stopped b/c R arm pain.  No history of kidney stones.  No CKD. Last BUN/Cr: Lab Results  Component Value Date   BUN 20 08/06/2018   BUN 16 06/27/2018   CREATININE 0.94 08/06/2018   CREATININE 0.76 06/27/2018   She is not on HCTZ.  + History of vitamin D deficiency.  Reviewed vitamin D levels: Lab Results  Component Value Date   VD25OH 40.03 05/11/2018   VD25OH 36 01/23/2014   VD25OH 39 03/12/2012   VD25OH 39 03/07/2011   VD25OH 32 04/07/2009  07/30/2010: Vitamin D 27.2  Continues vitamin D 2000 units daily.  Pt does not have a FH of hypercalcemia, pituitary tumors, thyroid cancer, or osteoporosis.   Pt. also has a history of hypothyroidism, controlled, on levothyroxine 75 mcg daily.  Latest TSH was normal: Lab Results  Component Value Date   TSH 2.040 05/03/2018   TSH 0.88 07/05/2017   TSH 0.14 (L) 04/13/2017   TSH 0.52 05/17/2016   TSH 0.17 (L) 01/09/2015  ROS: Constitutional: no weight gain/no weight loss, no fatigue, no subjective hyperthermia, no subjective hypothermia Eyes: no blurry vision, no xerophthalmia ENT: no sore throat, no nodules palpated in neck, no dysphagia, no odynophagia, no hoarseness Cardiovascular: no CP/no SOB/no palpitations/no leg swelling Respiratory: no cough/no SOB/no wheezing Gastrointestinal: no N/no V/no D/no C/no acid reflux Musculoskeletal: + muscle aches/+ joint aches Skin: no rashes, no hair loss Neurological: no tremors/no numbness/no tingling/no dizziness  I reviewed pt's medications, allergies, PMH, social hx, family hx, and changes were documented in the history of present illness. Otherwise, unchanged from my initial visit note.  Past Medical History:  Diagnosis Date  . Acute lumbar radiculopathy 04/2012  . Anemia    history of  .  Anxiety   . Atopic eczema   . CAD (coronary artery disease)   . Complication of anesthesia   . Depression   . Dyspnea    with activity  . Essential hypertension, benign   . Hyperlipidemia, statin intolerant   . Hypothyroidism   . Myocardial infarction ([place]) 2009  . NSTEMI (non-ST elevated myocardial infarction) ([place]) 2009  . Osteoporosis, s/p bisphophonates x 7 years, stopped in 2011   . Pneumonia   . PONV (postoperative nausea and vomiting)   . Sacrum and coccyx fracture [facility])    Past Surgical History:  Procedure Laterality Date  . APPENDECTOMY    . CARDIAC CATHETERIZATION  2009  . CARPAL TUNNEL RELEASE Bilateral   . HIP SURGERY  1980s   due to infection   . LAPAROSCOPIC HYSTERECTOMY    . LEFT HEART CATHETERIZATION WITH CORONARY ANGIOGRAM N/A 11/18/2013   Procedure: LEFT HEART CATHETERIZATION WITH CORONARY ANGIOGRAM;  Surgeon: Blane Ohara, MD;  Location: [facility] CATH LAB;  Service: Cardiovascular;  Laterality: N/A;  . Left interior parathyroiectomy     Dr. Harlow Asa 08-14-18  . LUMBAR LAMINECTOMY/DECOMPRESSION MICRODISCECTOMY  04/17/2012   Procedure: LUMBAR LAMINECTOMY/DECOMPRESSION MICRODISCECTOMY 1 LEVEL;  Surgeon: Erline Levine, MD;  Location: [place] NEURO ORS;  Service: Neurosurgery;  Laterality: Left;  Left Lumbar five-Sacral one Microdiskectomy  . PARATHYROIDECTOMY Left 08/14/2018   Procedure: LEFT INFERIOR PARATHYROIDECTOMY;  Surgeon: Armandina Gemma, MD;  Location: [facility] ORS;  Service: General;  Laterality: Left;  . SPINE SURGERY  03/2018   Dr Vertell Limber  . TONSILLECTOMY     Social History   Socioeconomic History  . Marital status: Married    Spouse name: Not on file  . Number of children: 2  . Years of education: Not on file  . Highest education level: Not on file  Occupational History  . Occupation: retired  Scientific laboratory technician  . Financial resource strain: Not on file  . Food insecurity:    Worry: Not on file    Inability: Not on file  . Transportation needs:    Medical: Not on  file    Non-medical: Not on file  Tobacco Use  . Smoking status: Never Smoker  . Smokeless tobacco: Never Used  Substance and Sexual Activity  . Alcohol use: Yes    Alcohol/week: 1.0 standard drinks    Types: 1 Glasses of wine per week    Comment: occasional glass of wine  . Drug use: No  . Sexual activity: Not Currently  Lifestyle  . Physical activity:    Days per week: Not on file    Minutes per session: Not on file  . Stress: Not on file  Relationships  . Social connections:    Talks on phone: Not on file    Gets  together: Not on file    Attends religious service: Not on file    Active member of club or organization: Not on file    Attends meetings of clubs or organizations: Not on file    Relationship status: Not on file  . Intimate partner violence:    Fear of current or ex partner: Not on file    Emotionally abused: Not on file    Physically abused: Not on file    Forced sexual activity: Not on file  Other Topics Concern  . Not on file  Social History Narrative  . Not on file   Current Outpatient Medications on File Prior to Visit  Medication Sig Dispense Refill  . acetaminophen (TYLENOL) 650 MG CR tablet Take 650 mg by mouth every 8 (eight) hours as needed for pain.    Marland Kitchen amLODipine (NORVASC) 5 MG tablet Take 1 tablet (5 mg total) by mouth daily. 90 tablet 3  . aspirin 325 MG EC tablet Take 650 mg by mouth daily as needed (chest pain).    Marland Kitchen aspirin 81 MG tablet Take 81 mg by mouth daily.      Marland Kitchen atorvastatin (LIPITOR) 10 MG tablet Take 1 tablet by mouth 3 times a week. (Patient taking differently: Take 10 mg by mouth 3 (three) times a week. Take 1 tablet by mouth 3 times a week.) 45 tablet 3  . Cholecalciferol (VITAMIN D) 2000 UNITS CAPS Take 1 capsule (2,000 Units total) by mouth daily. 30 capsule   . clobetasol (TEMOVATE) 0.05 % external solution Apply 1 application topically daily as needed. to scalp. 50 mL 1  . Coenzyme Q10 (CO Q-10) 300 MG CAPS Take 300 mg by  mouth daily.     Marland Kitchen desoximetasone (TOPICORT) 0.05 % cream Apply topically 2 (two) times daily. (Patient taking differently: Apply 1 application topically daily as needed (itching). Apply to face) 60 g 1  . Desoximetasone (TOPICORT) 0.25 % ointment Apply 1 application topically 2 (two) times daily as needed (itching). Apply to hands.     Marland Kitchen EPINEPHrine (EPIPEN) 0.3 mg/0.3 mL SOAJ injection Inject 0.3 mLs (0.3 mg total) into the muscle once. 1 Device 1  . ezetimibe (ZETIA) 10 MG tablet Take 1 tablet (10 mg total) by mouth daily. 90 tablet 3  . HYDROcodone-acetaminophen (NORCO/VICODIN) 5-325 MG tablet Take 1-2 tablets by mouth every 4 (four) hours as needed for moderate pain. 15 tablet 0  . levothyroxine (SYNTHROID, LEVOTHROID) 88 MCG tablet Take 88 mcg by mouth daily before breakfast.    . Multiple Vitamins-Minerals (EYE VITAMINS) TABS Take 1 tablet by mouth 2 (two) times daily.     . naphazoline (NAPHCON) 0.1 % ophthalmic solution Place 1 drop into both eyes daily as needed for eye irritation.    . nitroGLYCERIN (NITROSTAT) 0.4 MG SL tablet PLACE 1 TABLET UNDER THE                 TONGUE AND ALLOW TO                      DISSOLVE EVERY 5 MINUTES AS              NEEDED FOR CHEST PAIN AS DIRECTED BY YOUR DOCTOR 25 tablet 5   No current facility-administered medications on file prior to visit.    Allergies  Allergen Reactions  . Shellfish Allergy Hives and Shortness Of Breath  . Codeine     headache  . Lanolin Itching  .  Latex Other (See Comments)    Unknown allergy testing showed allergic  . Pollen Extract   . Statins     Simvastatin, Lipitor (both caused muscle aches); Crestor 5 mg and 10 mg daily (memory impairment), Livalo (throat and mouth itching), pravastatin 10 mg qd (memory)  . Cephalexin Itching and Rash  . Ciprofloxacin Rash  . Clindamycin/Lincomycin Rash  . Erythromycin Itching and Rash  . Penicillins Itching and Rash  . Propylene Glycol Itching and Rash  . Sulfa Antibiotics  Itching and Rash  . Tetracycline Itching and Rash   Family History  Problem Relation Age of Onset  . Colon cancer Mother 81       1st degree  . Stroke Father 38  . Stroke Unknown        Grandfather- grandmother    PE: BP 130/60   Pulse 85   Ht 5' 3.5" (1.613 m)   Wt 154 lb (69.9 kg)   SpO2 98%   BMI 26.85 kg/m  Wt Readings from Last 3 Encounters:  10/23/18 154 lb (69.9 kg)  08/14/18 149 lb (67.6 kg)  08/06/18 149 lb (67.6 kg)   Constitutional:normal weight, in NAD Eyes: PERRLA, EOMI, no exophthalmos ENT: moist mucous membranes, cervical scar almost completely healed, no cervical lymphadenopathy Cardiovascular: RRR, No MRG Respiratory: CTA B Gastrointestinal: abdomen soft, NT, ND, BS+ Musculoskeletal: no deformities, strength intact in all 4 Skin: moist, warm, no rashes Neurological: no tremor with outstretched hands, DTR normal in all 4  Assessment: 1. Hypercalcemia/hyperparathyroidism  2.  History of vitamin D deficiency  3.  Osteoporosis  Plan: Patient with history of several instances of elevated calcium, with the highest level being at 11.  Intact PTH was also high, at 135 for a corresponding calcium of 10.8.  Her vitamin D level was normal, as was her magnesium and phosphorus.  Calcitriol level was high and 24-hour urine calcium was also high.  She does not have a history of nephrolithiasis, but does have osteoporosis.  At last visit, she denied fractures, abdominal pain, depression, bone pain.  Suspicion was high for primary hyperparathyroidism and I referred her to surgery.  He had right inferior parathyroidectomy 3 months ago.  Her calcium and PTH decreased after surgery.  She is feeling well, without complaints today other than back and R arm pain. -At today's visit, I will recheck her calcium and PTH  2.  History of vitamin D deficiency -Most recent vitamin D level was normal 05/2018 -We will recheck her level today  3.  Osteoporosis -Reviewed the most  recent bone density report: She has osteoporosis and her T-scores were lower than those from 2015.  Since last bone density, she started weightbearing exercises 2-3 times a week in an effort to improve her T-scores.  Now that she had parathyroidectomy, I would like to see another bone density scan before deciding for or against further treatment.  She may need antiresorptive or anabolic medications afterwards. -She is due for another bone density scan 08/2019 >> will call me to order this before our next OV -I will see the patient back in 10 mo  Office Visit on 10/23/2018  Component Date Value Ref Range Status  . VITD 10/23/2018 43.92  30.00 - 100.00 ng/mL Final  . Calcium 10/23/2018 9.9  8.7 - 10.3 mg/dL Final  . PTH 10/23/2018 32  15 - 65 pg/mL Final  . PTH Interp 10/23/2018 Comment   Final   Comment: Interpretation  Intact PTH    Calcium                                 (pg/mL)      (mg/dL) Normal                          15 - 65     8.6 - 10.2 Primary Hyperparathyroidism         >65          >10.2 Secondary Hyperparathyroidism       >65          <10.2 Non-Parathyroid Hypercalcemia       <65          >10.2 Hypoparathyroidism                  <15          < 8.6 Non-Parathyroid Hypocalcemia    15 - 65          < 8.6   Labs are excellent!  Philemon Kingdom, MD PhD [facility] Endocrinology

## 2018-10-23 NOTE — Patient Instructions (Addendum)
Please stop at the lab.  Continue vitamin D 2000 units daily.  Please call me 3 weeks before our next visit to order the DXA scan.  Please return in 08/2019.

## 2018-10-24 LAB — PTH, INTACT AND CALCIUM
CALCIUM: 9.9 mg/dL (ref 8.7–10.3)
PTH: 32 pg/mL (ref 15–65)

## 2018-10-31 ENCOUNTER — Encounter: Payer: Self-pay | Admitting: Family Medicine

## 2018-10-31 DIAGNOSIS — Z9889 Other specified postprocedural states: Secondary | ICD-10-CM

## 2018-12-20 ENCOUNTER — Other Ambulatory Visit: Payer: Self-pay

## 2018-12-20 DIAGNOSIS — I10 Essential (primary) hypertension: Secondary | ICD-10-CM

## 2018-12-20 MED ORDER — EZETIMIBE 10 MG PO TABS: 10.0000 mg | ORAL_TABLET | Freq: Every day | ORAL | 1 refills | Status: DC

## 2019-04-10 ENCOUNTER — Encounter: Payer: Self-pay | Admitting: Family Medicine

## 2019-04-11 ENCOUNTER — Other Ambulatory Visit: Payer: Self-pay

## 2019-04-11 DIAGNOSIS — E782 Mixed hyperlipidemia: Secondary | ICD-10-CM

## 2019-04-11 DIAGNOSIS — M810 Age-related osteoporosis without current pathological fracture: Secondary | ICD-10-CM

## 2019-04-11 DIAGNOSIS — E039 Hypothyroidism, unspecified: Secondary | ICD-10-CM

## 2019-04-11 DIAGNOSIS — I10 Essential (primary) hypertension: Secondary | ICD-10-CM

## 2019-04-11 DIAGNOSIS — E559 Vitamin D deficiency, unspecified: Secondary | ICD-10-CM

## 2019-04-22 ENCOUNTER — Telehealth: Payer: Self-pay | Admitting: Physician Assistant

## 2019-04-22 NOTE — Telephone Encounter (Signed)
New Message        COVID-19 Pre-Screening Questions:   In the past 7 to 10 days have you had a cough,  shortness of breath, headache, congestion, fever (100 or greater) body aches, chills, sore throat, or sudden loss of taste or sense of smell? Occasional Cough she thinks is from a post nasal drip   Have you been around anyone with known Covid 19. NO  Have you been around anyone who is awaiting Covid 19 test results in the past 7 to 10 days? NO  Have you been around anyone who has been exposed to Covid 19, or has mentioned symptoms of Covid 19 within the past 7 to 10 days? NO Pt says her husband is needing to assist her to her appt to help her remember things. Pts husband answers NO to all questions   If you have any concerns/questions about symptoms patients report during screening (either on the phone or at threshold). Contact the provider seeing the patient or DOD for further guidance.  If neither are available contact a member of the leadership team.

## 2019-04-22 NOTE — Telephone Encounter (Signed)
Called and spoke to patient. She states that she has coughed a couple of times this last week. She states that it is like she just needs to clear her throat. Denies SOB, fever, HA, congestion, body aches, chills, sore throat, changes in taste or smell, or any other Sx. Patient will keep appt for tomorrow.

## 2019-04-22 NOTE — Progress Notes (Signed)
Cardiology Office Note    Date:  04/23/2019   ID:  [name], DOB 04/17/1941, MRN [id number]  PCP:  Briscoe Deutscher, DO  Cardiologist: [name], MD EPS: None  Chief Complaint  Patient presents with  . Follow-up    History of Present Illness:  Victoria [name] is a 78 y.o. female with history of CAD status post NSTEMI 2009 treated with balloon angioplasty of a small circumflex branch and noted to have small vessel disease. Cardiac cath 2016 for progressive angina demonstrated moderate diffuse mid LAD stenosis FFR analysis was borderline and she was treated medically. Patient also has hypercholesterolemia.  NST 02/20/2018 no ischemia LVEF 75%.  CT of her chest 01/2018 showed aortic atherosclerosis and ectatic ascending aorta with extensive coronary artery calcification.  I last saw the patient 05/01/2018 at which time she was doing well.  Has since had a parathyroidectomy 08/2018.  One week ago patient says she had a strong ache in her heart-awakened her from sleep. She took ASA without relief. Took 3 NTG but waited 15 min between first 2 NTG and then waited and hour to take the 3rd NTG and it eased. 3-4 days prior to that she was taking 2 ASA for back discomfort with relief. Always at rest. No exertional symptoms-does housework and cooking. Not very active since covid19. Gets on treadmill occasionally but not recently. Since that episode she feels better than ever. Doesn't want to do a stress test.   Past Medical History:  Diagnosis Date  . Acute lumbar radiculopathy 04/2012  . Anemia    history of  . Anxiety   . Atopic eczema   . CAD (coronary artery disease)   . Complication of anesthesia   . Depression   . Dyspnea    with activity  . Essential hypertension, benign   . Hyperlipidemia, statin intolerant   . Hypothyroidism   . Myocardial infarction ([name]) 2009  . NSTEMI (non-ST elevated myocardial infarction) ([place]) 2009  . Osteoporosis, s/p bisphophonates x 7  years, stopped in 2011   . Pneumonia   . PONV (postoperative nausea and vomiting)   . Sacrum and coccyx fracture [facility])     Past Surgical History:  Procedure Laterality Date  . APPENDECTOMY    . CARDIAC CATHETERIZATION  2009  . CARPAL TUNNEL RELEASE Bilateral   . HIP SURGERY  1980s   due to infection   . LAPAROSCOPIC HYSTERECTOMY    . LEFT HEART CATHETERIZATION WITH CORONARY ANGIOGRAM N/A 11/18/2013   Procedure: LEFT HEART CATHETERIZATION WITH CORONARY ANGIOGRAM;  Surgeon: Blane Ohara, MD;  Location: [facility] CATH LAB;  Service: Cardiovascular;  Laterality: N/A;  . Left interior parathyroiectomy     Dr. Harlow Asa 08-14-18  . LUMBAR LAMINECTOMY/DECOMPRESSION MICRODISCECTOMY  04/17/2012   Procedure: LUMBAR LAMINECTOMY/DECOMPRESSION MICRODISCECTOMY 1 LEVEL;  Surgeon: Erline Levine, MD;  Location: [facility] ORS;  Service: Neurosurgery;  Laterality: Left;  Left Lumbar five-Sacral one Microdiskectomy  . PARATHYROIDECTOMY Left 08/14/2018   Procedure: LEFT INFERIOR PARATHYROIDECTOMY;  Surgeon: Armandina Gemma, MD;  Location: [facility] ORS;  Service: General;  Laterality: Left;  . SPINE SURGERY  03/2018   Dr Vertell Limber  . TONSILLECTOMY      Current Medications: Current Meds  Medication Sig  . amLODipine (NORVASC) 5 MG tablet Take 1 tablet (5 mg total) by mouth daily.  Marland Kitchen aspirin 325 MG EC tablet Take 650 mg by mouth daily as needed (chest pain).  Marland Kitchen aspirin 81 MG tablet Take 81 mg by mouth daily.    Marland Kitchen  atorvastatin (LIPITOR) 10 MG tablet Take 1 tablet by mouth 3 times a week.  . Cholecalciferol (VITAMIN D) 2000 UNITS CAPS Take 1 capsule (2,000 Units total) by mouth daily.  . clobetasol (TEMOVATE) 0.05 % external solution Apply 1 application topically daily as needed. to scalp.  . Coenzyme Q10 (CO Q-10) 300 MG CAPS Take 300 mg by mouth daily.   Marland Kitchen desoximetasone (TOPICORT) 0.05 % cream Apply topically 2 (two) times daily. (Patient taking differently: Apply 1 application topically daily as needed (itching). Apply to  face)  . Desoximetasone (TOPICORT) 0.25 % ointment Apply 1 application topically 2 (two) times daily as needed (itching). Apply to hands.   Marland Kitchen EPINEPHrine (EPIPEN) 0.3 mg/0.3 mL SOAJ injection Inject 0.3 mLs (0.3 mg total) into the muscle once.  . ezetimibe (ZETIA) 10 MG tablet Take 1 tablet (10 mg total) by mouth daily.  Marland Kitchen levothyroxine (SYNTHROID, LEVOTHROID) 88 MCG tablet Take 88 mcg by mouth daily before breakfast.  . Multiple Vitamins-Minerals (EYE VITAMINS) TABS Take 1 tablet by mouth 2 (two) times daily.   . naphazoline (NAPHCON) 0.1 % ophthalmic solution Place 1 drop into both eyes daily as needed for eye irritation.  . nitroGLYCERIN (NITROSTAT) 0.4 MG SL tablet PLACE 1 TABLET UNDER THE                 TONGUE AND ALLOW TO                      DISSOLVE EVERY 5 MINUTES AS              NEEDED FOR CHEST PAIN AS DIRECTED BY YOUR DOCTOR  . [DISCONTINUED] amLODipine (NORVASC) 5 MG tablet Take 1 tablet (5 mg total) by mouth daily.  . [DISCONTINUED] atorvastatin (LIPITOR) 10 MG tablet Take 1 tablet by mouth 3 times a week. (Patient taking differently: Take 10 mg by mouth 3 (three) times a week. Take 1 tablet by mouth 3 times a week.)  . [DISCONTINUED] ezetimibe (ZETIA) 10 MG tablet Take 1 tablet (10 mg total) by mouth daily.  . [DISCONTINUED] nitroGLYCERIN (NITROSTAT) 0.4 MG SL tablet PLACE 1 TABLET UNDER THE                 TONGUE AND ALLOW TO                      DISSOLVE EVERY 5 MINUTES AS              NEEDED FOR CHEST PAIN AS DIRECTED BY YOUR DOCTOR     Allergies:   Shellfish allergy, Codeine, Lanolin, Latex, Pollen extract, Statins, Cephalexin, Ciprofloxacin, Clindamycin/lincomycin, Erythromycin, Penicillins, Propylene glycol, Sulfa antibiotics, and Tetracycline   Social History   Socioeconomic History  . Marital status: Married    Spouse name: Not on file  . Number of children: 2  . Years of education: Not on file  . Highest education level: Not on file  Occupational History  .  Occupation: retired  Scientific laboratory technician  . Financial resource strain: Not on file  . Food insecurity    Worry: Not on file    Inability: Not on file  . Transportation needs    Medical: Not on file    Non-medical: Not on file  Tobacco Use  . Smoking status: Never Smoker  . Smokeless tobacco: Never Used  Substance and Sexual Activity  . Alcohol use: Yes    Alcohol/week: 1.0 standard drinks    Types: 1 Glasses  of wine per week    Comment: occasional glass of wine  . Drug use: No  . Sexual activity: Not Currently  Lifestyle  . Physical activity    Days per week: Not on file    Minutes per session: Not on file  . Stress: Not on file  Relationships  . Social Herbalist on phone: Not on file    Gets together: Not on file    Attends religious service: Not on file    Active member of club or organization: Not on file    Attends meetings of clubs or organizations: Not on file    Relationship status: Not on file  Other Topics Concern  . Not on file  Social History Narrative  . Not on file     Family History:  The patient's family history includes Colon cancer (age of onset: 2) in her mother; Stroke in her unknown relative; Stroke (age of onset: 37) in her father.   ROS:   Please see the history of present illness.    Review of Systems  Constitution: Negative.  HENT: Negative.   Eyes: Negative.   Cardiovascular: Positive for chest pain.  Respiratory: Negative.   Hematologic/Lymphatic: Negative.   Musculoskeletal: Negative.  Negative for joint pain.  Gastrointestinal: Negative.   Genitourinary: Negative.   Neurological: Negative.   Psychiatric/Behavioral: Positive for memory loss. The patient is nervous/anxious.    All other systems reviewed and are negative.   PHYSICAL EXAM:   VS:  BP 134/83   Pulse 73   Ht 5' 3.52" (1.613 m)   Wt 153 lb (69.4 kg)   SpO2 97%   BMI 26.66 kg/m   Physical Exam  GEN: Well nourished, well developed, in no acute distress  Neck:  no JVD, carotid bruits, or masses Cardiac:RRR; no murmurs, rubs, or gallops  Respiratory:  clear to auscultation bilaterally, normal work of breathing GI: soft, nontender, nondistended, + BS Ext: without cyanosis, clubbing, or edema, Good distal pulses bilaterally Neuro:  Alert and Oriented x 3 Psych: euthymic mood, full affect  Wt Readings from Last 3 Encounters:  04/23/19 153 lb (69.4 kg)  10/23/18 154 lb (69.9 kg)  08/14/18 149 lb (67.6 kg)      Studies/Labs Reviewed:   EKG:  EKG is ordered today.  The ekg ordered today demonstrates normal sinus rhythm no acute change  Recent Labs: 05/03/2018: ALT 21; TSH 2.040 06/27/2018: Magnesium 2.3 08/06/2018: BUN 20; Creatinine, Ser 0.94; Hemoglobin 13.3; Platelets 258; Potassium 4.2; Sodium 139   Lipid Panel    Component Value Date/Time   CHOL 171 05/03/2018 0000   TRIG 93 05/03/2018 0000   HDL 59 05/03/2018 0000   CHOLHDL 2.9 05/03/2018 0000   CHOLHDL 3.4 10/06/2015 0753   VLDL 25 10/06/2015 0753   LDLCALC 93 05/03/2018 0000    Additional studies/ records that were reviewed today include:    Nuclear stress test 02/20/2018   Nuclear stress EF: 75%.  There was no ST segment deviation noted during stress.  The left ventricular ejection fraction is hyperdynamic (>65%).   Poor quality study with diffusely low counts in apex and inferior wall on all images No evidence of ischemia EF 75%   CT of the chest 5/2019IMPRESSION: The study does not show a lung lesion. Findings at chest radiography or presumably secondary to summation shadows.   Aortic atherosclerosis. Ectatic ascending aorta. Extensive coronary artery calcification.        ASSESSMENT:    1.  Atherosclerosis of native coronary artery of native heart, angina presence unspecified   2. Essential hypertension, benign   3. Mixed hyperlipidemia   4. Primary hyperparathyroidism ([place])   5. Essential hypertension   6. Coronary artery disease of native artery of native  heart with stable angina pectoris (HCC)      PLAN:  In order of problems listed above:  CAD status post NSTEMI treated with PTCA of small circumflex branch.  Cardiac cath 2016 with moderate diffuse mid LAD stenosis FFR was borderline and she was treated medically.  CT scan 01/2018 showed extensive coronary calcification, NST 01/2018 no ischemia normal LVEF.  Patient has history of chronic chest pain.  Awakened from sleep last week with "her heart hurting" relieved with nitroglycerin after an hour and a half.  I offered a stress test but the patient declined.  I instructed her on proper use of nitroglycerin.  Add Imdur 30 mg once daily.  Call if recurrent chest pain.  Follow-up with Dr. Burt Knack in 1 year.  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  PCP ordered labs today to be drawn at our office.  Medication Adjustments/Labs and Tests Ordered: Current medicines are reviewed at length with the patient today.  Concerns regarding medicines are outlined above.  Medication changes, Labs and Tests ordered today are listed in the Patient Instructions below. Patient Instructions  Medication Instructions:  Your physician has recommended you make the following change in your medication:  1.  START Imdur 30 mg taking 1 tablet daily  If you need a refill on your cardiac medications before your next appointment, please call your pharmacy.   Lab work: PREVIOUSLY ORDERED:  CMET, MAGNESIUM, THYROID PEROXIDASE, VITAMIN D, LIPID, & CBC W/DIFF  If you have labs (blood work) drawn today and your tests are completely normal, you will receive your results only by: Marland Kitchen MyChart Message (if you have MyChart) OR . A paper copy in the mail If you have any lab test that is abnormal or we need to change your treatment, we will call you to  review the results.  Testing/Procedures: None ordered  Follow-Up: At [facility], you and your health needs are our priority.  As part of our continuing mission to provide you with exceptional heart care, we have created designated Provider Care Teams.  These Care Teams include your primary Cardiologist (physician) and Advanced Practice Providers (APPs -  Physician Assistants and Nurse Practitioners) who all work together to provide you with the care you need, when you need it. You will need a follow up appointment in:  12 months.  Please call our office 2 months in advance to schedule this appointment.  You may see [name], MD or one of the following Advanced Practice Providers on your designated Care Team: Richardson Dopp, PA-C [place], [place] . Daune Perch, NP  Any Other Special Instructions Will Be Listed Below (If Applicable).      Signed, Ermalinda Barrios, PA-C  04/23/2019 9:10 AM    [facility] [address], [place], [place]  [address] Phone: [phone]; Fax: [phone]

## 2019-04-23 ENCOUNTER — Other Ambulatory Visit: Payer: Self-pay

## 2019-04-23 ENCOUNTER — Ambulatory Visit (INDEPENDENT_AMBULATORY_CARE_PROVIDER_SITE_OTHER): Payer: Medicare Other | Admitting: Physician Assistant

## 2019-04-23 ENCOUNTER — Encounter: Payer: Self-pay | Admitting: Physician Assistant

## 2019-04-23 VITALS — BP 134/83 | HR 73 | Ht 63.52 in | Wt 153.0 lb

## 2019-04-23 DIAGNOSIS — I10 Essential (primary) hypertension: Secondary | ICD-10-CM

## 2019-04-23 DIAGNOSIS — E210 Primary hyperparathyroidism: Secondary | ICD-10-CM

## 2019-04-23 DIAGNOSIS — E782 Mixed hyperlipidemia: Secondary | ICD-10-CM | POA: Diagnosis not present

## 2019-04-23 DIAGNOSIS — I2510 Atherosclerotic heart disease of native coronary artery without angina pectoris: Secondary | ICD-10-CM

## 2019-04-23 DIAGNOSIS — I25118 Atherosclerotic heart disease of native coronary artery with other forms of angina pectoris: Secondary | ICD-10-CM

## 2019-04-23 MED ORDER — ATORVASTATIN CALCIUM 10 MG PO TABS: ORAL_TABLET | ORAL | 3 refills | Status: DC

## 2019-04-23 MED ORDER — ISOSORBIDE MONONITRATE ER 30 MG PO TB24: 30.0000 mg | ORAL_TABLET | Freq: Every day | ORAL | 3 refills | Status: DC

## 2019-04-23 MED ORDER — EZETIMIBE 10 MG PO TABS: 10.0000 mg | ORAL_TABLET | Freq: Every day | ORAL | 3 refills | Status: DC

## 2019-04-23 MED ORDER — NITROGLYCERIN 0.4 MG SL SUBL: SUBLINGUAL_TABLET | SUBLINGUAL | 3 refills | Status: DC

## 2019-04-23 MED ORDER — AMLODIPINE BESYLATE 5 MG PO TABS: 5.0000 mg | ORAL_TABLET | Freq: Every day | ORAL | 3 refills | Status: DC

## 2019-04-23 NOTE — Patient Instructions (Addendum)
Medication Instructions:  Your physician has recommended you make the following change in your medication:  1.  START Imdur 30 mg taking 1 tablet daily  If you need a refill on your cardiac medications before your next appointment, please call your pharmacy.   Lab work: PREVIOUSLY ORDERED:  CMET, MAGNESIUM, THYROID PEROXIDASE, VITAMIN D, LIPID, & CBC W/DIFF  If you have labs (blood work) drawn today and your tests are completely normal, you will receive your results only by: Marland Kitchen MyChart Message (if you have MyChart) OR . A paper copy in the mail If you have any lab test that is abnormal or we need to change your treatment, we will call you to review the results.  Testing/Procedures: None ordered  Follow-Up: At [facility], you and your health needs are our priority.  As part of our continuing mission to provide you with exceptional heart care, we have created designated Provider Care Teams.  These Care Teams include your primary Cardiologist (physician) and Advanced Practice Providers (APPs -  Physician Assistants and Nurse Practitioners) who all work together to provide you with the care you need, when you need it. You will need a follow up appointment in:  12 months.  Please call our office 2 months in advance to schedule this appointment.  You may see [name], MD or one of the following Advanced Practice Providers on your designated Care Team: Richardson Dopp, PA-C [place], [place] . Daune Perch, NP  Any Other Special Instructions Will Be Listed Below (If Applicable).

## 2019-04-23 NOTE — Addendum Note (Signed)
Addended by: Gaetano Net on: 04/23/2019 09:15 AM   Modules accepted: Orders

## 2019-04-28 LAB — CBC WITH DIFFERENTIAL/PLATELET
Basophils Absolute: 0 10*3/uL (ref 0.0–0.2)
Basos: 1 %
EOS (ABSOLUTE): 0.9 10*3/uL — ABNORMAL HIGH (ref 0.0–0.4)
Eos: 15 %
Hematocrit: 38 % (ref 34.0–46.6)
Hemoglobin: 13.3 g/dL (ref 11.1–15.9)
Immature Grans (Abs): 0 10*3/uL (ref 0.0–0.1)
Immature Granulocytes: 0 %
Lymphocytes Absolute: 2.2 10*3/uL (ref 0.7–3.1)
Lymphs: 38 %
MCH: 32.8 pg (ref 26.6–33.0)
MCHC: 35 g/dL (ref 31.5–35.7)
MCV: 94 fL (ref 79–97)
Monocytes Absolute: 0.4 10*3/uL (ref 0.1–0.9)
Monocytes: 6 %
Neutrophils Absolute: 2.4 10*3/uL (ref 1.4–7.0)
Neutrophils: 40 %
Platelets: 263 10*3/uL (ref 150–450)
RBC: 4.06 x10E6/uL (ref 3.77–5.28)
RDW: 12.7 % (ref 11.7–15.4)
WBC: 5.8 10*3/uL (ref 3.4–10.8)

## 2019-04-28 LAB — COMPREHENSIVE METABOLIC PANEL
ALT: 26 IU/L (ref 0–32)
AST: 23 IU/L (ref 0–40)
Albumin/Globulin Ratio: 1.6 (ref 1.2–2.2)
Albumin: 4.5 g/dL (ref 3.7–4.7)
Alkaline Phosphatase: 80 IU/L (ref 39–117)
BUN/Creatinine Ratio: 19 (ref 12–28)
BUN: 15 mg/dL (ref 8–27)
Bilirubin Total: 0.4 mg/dL (ref 0.0–1.2)
CO2: 21 mmol/L (ref 20–29)
Calcium: 9.6 mg/dL (ref 8.7–10.3)
Chloride: 105 mmol/L (ref 96–106)
Creatinine, Ser: 0.81 mg/dL (ref 0.57–1.00)
GFR calc Af Amer: 80 mL/min/{1.73_m2} (ref >59)
GFR calc non Af Amer: 70 mL/min/{1.73_m2} (ref >59)
Globulin, Total: 2.8 g/dL (ref 1.5–4.5)
Glucose: 90 mg/dL (ref 65–99)
Potassium: 4.5 mmol/L (ref 3.5–5.2)
Sodium: 140 mmol/L (ref 134–144)
Total Protein: 7.3 g/dL (ref 6.0–8.5)

## 2019-04-28 LAB — VITAMIN D 1,25 DIHYDROXY
Vitamin D 1, 25 (OH)2 Total: 45 pg/mL
Vitamin D2 1, 25 (OH)2: <10 pg/mL
Vitamin D3 1, 25 (OH)2: 45 pg/mL

## 2019-04-28 LAB — LIPID PANEL
Chol/HDL Ratio: 2.9 ratio (ref 0.0–4.4)
Cholesterol, Total: 144 mg/dL (ref 100–199)
HDL: 50 mg/dL (ref >39)
LDL Calculated: 79 mg/dL (ref 0–99)
Triglycerides: 77 mg/dL (ref 0–149)
VLDL Cholesterol Cal: 15 mg/dL (ref 5–40)

## 2019-04-28 LAB — THYROID PEROXIDASE ANTIBODY: Thyroperoxidase Ab SerPl-aCnc: 14 IU/mL (ref 0–34)

## 2019-04-28 LAB — MAGNESIUM: Magnesium: 2.1 mg/dL (ref 1.6–2.3)

## 2019-05-13 ENCOUNTER — Telehealth: Payer: Self-pay

## 2019-05-13 NOTE — Addendum Note (Signed)
Addended by: Harland German A on: 05/13/2019 05:19 PM   Modules accepted: Orders

## 2019-05-13 NOTE — Telephone Encounter (Signed)
Called patient to cancel appointment with Dr. Burt Knack in November as she just had OV with Ermalinda Barrios 7/21 and was instructed to have a 1 year visit.  The patient states she wishes to keep the appointment with Dr. Burt Knack because Selinda Eon changed her medications and she does not want to change anything without discussing with Dr. Burt Knack first.   She was instructed to start Imdur 30 mg daily due to CP and amount of NTG the patient was taking. The patient has not started the medication and states her CP is better. She has not had to take any NTG since the visit. She has done a lot of research on the drug and does not want to take it since she is taking amlodipine and that also affects BP (and her BP is running 120s/70s).  Instructed her to stay off Imdur. She will call if CP episodes recur.  She also did not increase her atorvastatin as directed at her visit to 4 times weekly. She states she will attempt to increase frequency starting this week. She got a letter from her insurance stating they will not pay for the drug - instructed her to call the pharmacy and request out of pocket pricing. She will call back if pricing is too high or if she cannot tolerate the extra dose.  She will keep her appointment in October with Dr. Burt Knack. She understands she will be called if Dr. Burt Knack has any further recommendations.  She was grateful for assistance.

## 2019-05-14 NOTE — Telephone Encounter (Signed)
Agree. thanks

## 2019-06-04 ENCOUNTER — Telehealth: Payer: Self-pay | Admitting: Family Medicine

## 2019-06-04 ENCOUNTER — Encounter: Payer: Self-pay | Admitting: Family Medicine

## 2019-06-04 ENCOUNTER — Ambulatory Visit (INDEPENDENT_AMBULATORY_CARE_PROVIDER_SITE_OTHER): Payer: Medicare Other | Admitting: Family Medicine

## 2019-06-04 VITALS — BP 118/76 | Ht 62.52 in

## 2019-06-04 DIAGNOSIS — R0789 Other chest pain: Secondary | ICD-10-CM

## 2019-06-04 DIAGNOSIS — E559 Vitamin D deficiency, unspecified: Secondary | ICD-10-CM

## 2019-06-04 DIAGNOSIS — E039 Hypothyroidism, unspecified: Secondary | ICD-10-CM | POA: Diagnosis not present

## 2019-06-04 DIAGNOSIS — E782 Mixed hyperlipidemia: Secondary | ICD-10-CM

## 2019-06-04 DIAGNOSIS — I2510 Atherosclerotic heart disease of native coronary artery without angina pectoris: Secondary | ICD-10-CM

## 2019-06-04 DIAGNOSIS — Z9889 Other specified postprocedural states: Secondary | ICD-10-CM

## 2019-06-04 DIAGNOSIS — M810 Age-related osteoporosis without current pathological fracture: Secondary | ICD-10-CM

## 2019-06-04 DIAGNOSIS — Z889 Allergy status to unspecified drugs, medicaments and biological substances status: Secondary | ICD-10-CM

## 2019-06-04 DIAGNOSIS — J309 Allergic rhinitis, unspecified: Secondary | ICD-10-CM

## 2019-06-04 MED ORDER — FAMOTIDINE 20 MG PO TABS: 20.0000 mg | ORAL_TABLET | Freq: Every day | ORAL | 2 refills | Status: DC

## 2019-06-04 MED ORDER — OMEPRAZOLE 20 MG PO CPDR: 20.0000 mg | DELAYED_RELEASE_CAPSULE | Freq: Every day | ORAL | 3 refills | Status: DC

## 2019-06-04 NOTE — Progress Notes (Signed)
Virtual Visit via Video   Due to the COVID-19 pandemic, this visit was completed with telemedicine (audio/video) technology to reduce patient and provider exposure as well as to preserve personal protective equipment.   I connected with Victoria [name] by a video enabled telemedicine application and verified that I am speaking with the correct person using two identifiers. Location patient: Home Location provider: [facility], Office Persons participating in the virtual visit: Alissa, Knecht, DO   I discussed the limitations of evaluation and management by telemedicine and the availability of in person appointments. The patient expressed understanding and agreed to proceed.  Care Team   Patient Care Team: Briscoe Deutscher, DO as PCP - General (Family Medicine) [name], MD (Cardiology) Erline Levine, MD (Neurosurgery) Ronald Lobo, MD (Gastroenterology) Armandina Gemma, MD as Consulting Physician (General Surgery)   Subjective:   HPI: CP, mid back, some radiation to left arm, always when lying down or sitting, cannot get comfortable. Usually takes 2 ASA and feels better (chew and drinks with milk). Last week, took 3 NTG that were a few years old with relief after the third. Hx of CAD, MI, osteoporosis, multiple joint issues. Exercising by walking 15 minutes on the treadmill. Feels achy the next day, but no chest pain or increased SOB during exercise. Denies dyspepsia at first, but later remembers that she needs TUMS sometimes. Hx of laminectomy and osteoporosis. No injury or constant pain. No bowel changes. No fever. Hx of MI - states that it was not as severe as known cardiac event. Saw cardiology PA, Imdur recommended. Patient held it. She found old paperwork that showed that she was trialed on Imdur after MI. Felt confused and foggy. Stopped. Will be following up with Dr. Burt Knack in October.  Patient Active Problem List   Diagnosis Date Noted  . Multiple  drug allergies 06/04/2019  . Hx of laminectomy L5-1 10/31/2018  . Primary hyperparathyroidism ([place]) 06/27/2018  . Chronic right-sided low back pain with right-sided sciatica 08/15/2017  . Essential hypertension, benign 05/14/2011  . Vitamin D deficiency 08/02/2010  . Allergic rhinitis 07/30/2010  . Coronary atherosclerosis 01/19/2009  . Hypothyroidism, on Levothyroxine 12/04/2008  . Hyperlipidemia, Lipitor 3-4 times weekly 12/04/2008  . Osteoporosis, patient declines medication for treatment 12/04/2008    Social History   Tobacco Use  . Smoking status: Never Smoker  . Smokeless tobacco: Never Used  Substance Use Topics  . Alcohol use: Yes    Alcohol/week: 1.0 standard drinks    Types: 1 Glasses of wine per week    Comment: occasional glass of wine   Current Outpatient Medications:  .  amLODipine (NORVASC) 5 MG tablet, Take 1 tablet (5 mg total) by mouth daily., Disp: 90 tablet, Rfl: 3 .  aspirin 325 MG EC tablet, Take 650 mg by mouth daily as needed (chest pain)., Disp: , Rfl:  .  aspirin 81 MG tablet, Take 81 mg by mouth daily.  , Disp: , Rfl:  .  atorvastatin (LIPITOR) 10 MG tablet, Take 1 tablet by mouth 3 times a week., Disp: 45 tablet, Rfl: 3 .  Cholecalciferol (VITAMIN D) 2000 UNITS CAPS, Take 1 capsule (2,000 Units total) by mouth daily., Disp: 30 capsule, Rfl:  .  clobetasol (TEMOVATE) 0.05 % external solution, Apply 1 application topically daily as needed. to scalp., Disp: 50 mL, Rfl: 1 .  Coenzyme Q10 (CO Q-10) 300 MG CAPS, Take 300 mg by mouth daily. , Disp: , Rfl:  .  Desoximetasone (TOPICORT)  0.25 % ointment, Apply 1 application topically 2 (two) times daily as needed (itching). Apply to hands. , Disp: , Rfl:  .  EPINEPHrine (EPIPEN) 0.3 mg/0.3 mL SOAJ injection, Inject 0.3 mLs (0.3 mg total) into the muscle once., Disp: 1 Device, Rfl: 1 .  ezetimibe (ZETIA) 10 MG tablet, Take 1 tablet (10 mg total) by mouth daily., Disp: 90 tablet, Rfl: 3 .  levothyroxine (SYNTHROID,  LEVOTHROID) 88 MCG tablet, Take 88 mcg by mouth daily before breakfast., Disp: , Rfl:  .  Multiple Vitamins-Minerals (EYE VITAMINS) TABS, Take 1 tablet by mouth 2 (two) times daily. , Disp: , Rfl:  .  naphazoline (NAPHCON) 0.1 % ophthalmic solution, Place 1 drop into both eyes daily as needed for eye irritation., Disp: , Rfl:  .  nitroGLYCERIN (NITROSTAT) 0.4 MG SL tablet, PLACE 1 TABLET UNDER THE                 TONGUE AND ALLOW TO                      DISSOLVE EVERY 5 MINUTES AS              NEEDED FOR CHEST PAIN AS DIRECTED BY YOUR DOCTOR, Disp: 25 tablet, Rfl: 3   Allergies  Allergen Reactions  . Shellfish Allergy Hives and Shortness Of Breath  . Codeine Other (See Comments)    Headache  . Imdur [Isosorbide Nitrate] Other (See Comments)    Confusion  . Lanolin Itching  . Latex Other (See Comments)    Unknown, allergy testing result  . Pollen Extract   . Statins     Simvastatin, Lipitor (both caused muscle aches); Crestor 5 mg and 10 mg daily (memory impairment), Livalo (throat and mouth itching), pravastatin 10 mg qd (memory)  . Cephalexin Itching and Rash  . Ciprofloxacin Rash  . Clindamycin/Lincomycin Rash  . Erythromycin Itching and Rash  . Penicillins Itching and Rash  . Propylene Glycol Itching and Rash  . Sulfa Antibiotics Itching and Rash  . Tetracycline Itching and Rash    Objective:   VITALS: Per patient if applicable, see vitals. GENERAL: Alert, appears well and in no acute distress. HEENT: Atraumatic, conjunctiva clear, no obvious abnormalities on inspection of external nose and ears. NECK: Normal movements of the head and neck. CARDIOPULMONARY: No increased WOB. Speaking in clear sentences. I:E ratio WNL.  MS: Moves all visible extremities without noticeable abnormality. PSYCH: Pleasant and cooperative, well-groomed. Speech normal rate and rhythm. Affect is appropriate. Insight and judgement are appropriate. Attention is focused, linear, and appropriate.  NEURO:  CN grossly intact. Oriented as arrived to appointment on time with no prompting. Moves both UE equally.  SKIN: No obvious lesions, wounds, erythema, or cyanosis noted on face or hands.  Depression screen [facility] 2/9 03/01/2018 07/05/2017 05/17/2016  Decreased Interest 0 0 (No Data)  Down, Depressed, Hopeless 0 0 -  PHQ - 2 Score 0 0 -   Assessment and Plan:   Bexli was seen today for follow-up.  Diagnoses and all orders for this visit:  Atypical chest pain Comments: Sounds GI. Will trial below. Has f/u with Cardiology next month. Will add lipase to today's labs.  Orders: -     famotidine (PEPCID) 20 MG tablet; Take 1 tablet (20 mg total) by mouth daily. -     omeprazole (PRILOSEC) 20 MG capsule; Take 1 capsule (20 mg total) by mouth daily. -     Lipase; Future  Atherosclerosis  of native coronary artery of native heart, angina presence unspecified  Acquired hypothyroidism Comments: Due for labs. Orders: -     TSH; Future -     T4, free; Future  Mixed hyperlipidemia  Osteoporosis without current pathological fracture, unspecified osteoporosis type Comments: Patient declined medication treatment.   Hx of laminectomy L5-1  Vitamin D deficiency  Allergic rhinitis, unspecified seasonality, unspecified trigger  Multiple drug allergies   . COVID-19 Education: The signs and symptoms of COVID-19 were discussed with the patient and how to seek care for testing if needed. The importance of social distancing was discussed today. . Reviewed expectations re: course of current medical issues. . Discussed self-management of symptoms. . Outlined signs and symptoms indicating need for more acute intervention. . Patient verbalized understanding and all questions were answered. Marland Kitchen Health Maintenance issues including appropriate healthy diet, exercise, and smoking avoidance were discussed with patient. . See orders for this visit as documented in the electronic medical record.  Briscoe Deutscher, DO  Records requested if needed. Time spent: 25 minutes, of which >50% was spent in obtaining information about her symptoms, reviewing her previous labs, evaluations, and treatments, counseling her about her condition (please see the discussed topics above), and developing a plan to further investigate it; she had a number of questions which I addressed.

## 2019-06-04 NOTE — Telephone Encounter (Signed)
I spoke with the patient and asked her to schedule AWV with Loma Sousa tomorrow after labs, but she declined the AWV visit. VDM (Dee-Dee)

## 2019-06-05 ENCOUNTER — Other Ambulatory Visit (INDEPENDENT_AMBULATORY_CARE_PROVIDER_SITE_OTHER): Payer: Medicare Other

## 2019-06-05 ENCOUNTER — Other Ambulatory Visit: Payer: Self-pay

## 2019-06-05 DIAGNOSIS — E039 Hypothyroidism, unspecified: Secondary | ICD-10-CM

## 2019-06-05 DIAGNOSIS — R0789 Other chest pain: Secondary | ICD-10-CM | POA: Diagnosis not present

## 2019-06-05 LAB — T4, FREE: Free T4: 1.09 ng/dL (ref 0.60–1.60)

## 2019-06-05 LAB — TSH: TSH: 0.64 u[IU]/mL (ref 0.35–4.50)

## 2019-06-05 LAB — LIPASE: Lipase: 24 U/L (ref 11.0–59.0)

## 2019-06-24 ENCOUNTER — Other Ambulatory Visit: Payer: Self-pay | Admitting: Family Medicine

## 2019-06-24 DIAGNOSIS — E039 Hypothyroidism, unspecified: Secondary | ICD-10-CM

## 2019-07-03 ENCOUNTER — Telehealth: Payer: Self-pay | Admitting: Cardiovascular Disease

## 2019-07-03 NOTE — Telephone Encounter (Signed)
  Patient would like her husband to come to the visit with her because she doesn't always remember everything. appt is on 07/08/19

## 2019-07-03 NOTE — Telephone Encounter (Signed)
The patient reports she has memory issues and will need her husband at her visit Monday.  Per policy, informed the patient she may have a visitor. She was grateful for assistance.

## 2019-07-08 ENCOUNTER — Encounter: Payer: Self-pay | Admitting: Family Medicine

## 2019-07-08 ENCOUNTER — Other Ambulatory Visit: Payer: Self-pay

## 2019-07-08 ENCOUNTER — Ambulatory Visit: Payer: Medicare Other | Admitting: Cardiovascular Disease

## 2019-07-08 ENCOUNTER — Encounter: Payer: Self-pay | Admitting: Cardiovascular Disease

## 2019-07-08 VITALS — BP 120/70 | HR 89 | Ht 64.0 in | Wt 154.2 lb

## 2019-07-08 DIAGNOSIS — I25119 Atherosclerotic heart disease of native coronary artery with unspecified angina pectoris: Secondary | ICD-10-CM | POA: Diagnosis not present

## 2019-07-08 DIAGNOSIS — E782 Mixed hyperlipidemia: Secondary | ICD-10-CM

## 2019-07-08 DIAGNOSIS — I10 Essential (primary) hypertension: Secondary | ICD-10-CM | POA: Diagnosis not present

## 2019-07-08 NOTE — Patient Instructions (Signed)
Medication Instructions:  Your provider recommends that you continue on your current medications as directed. Please refer to the Current Medication list given to you today.    Labwork: None  Testing/Procedures: None  Follow-Up: Your provider wants you to follow-up in: 6 months. You will receive a reminder letter in the mail two months in advance. If you don't receive a letter, please call our office to schedule the follow-up appointment.    Any Other Special Instructions Will Be Listed Below (If Applicable).     If you need a refill on your cardiac medications before your next appointment, please call your pharmacy.

## 2019-07-08 NOTE — Progress Notes (Signed)
Cardiology Office Note:    Date:  07/08/2019   ID:  Victoria [name], DOB [birth year], MRN [id number]  PCP:  Victoria Deutscher, DO  Cardiologist:  Victoria Mocha, MD  Electrophysiologist:  None   Referring MD: Victoria Deutscher, DO   Chief Complaint  Patient presents with   Coronary Artery Disease   Chest Pain    History of Present Illness:    Victoria [name] is a 79 y.o. female with a hx of coronary artery disease who initially presented in 2009 with non-ST elevation MI treated with balloon angioplasty of a small circumflex branch.  In 2016 she developed progressive angina and underwent repeat catheterization with demonstration of moderate diffuse mid LAD stenosis and borderline FFR analysis.  Medical therapy was recommended.  She had a nuclear scan in May 2019 demonstrating no ischemia and normal LVEF of 75%.  She is here with her husband today. She's having frequent chest pain episodes in the afternoons. She feels like this is when she is most fatigued. She was taking NTG without improvement but realized her NTG had expired. After getting it refilled she states that it has worked very well. She complains of exertional dyspnea, but no chest pain with exertion. Attributes 'tiredness' as the primary cause of her chest pain.   Past Medical History:  Diagnosis Date   Acute lumbar radiculopathy 04/2012   Anemia    history of   Anxiety    Atopic eczema    CAD (coronary artery disease)    Complication of anesthesia    Depression    Dyspnea    with activity   Essential hypertension, benign    Hyperlipidemia, statin intolerant    Hypothyroidism    Myocardial infarction [facility]) 2009   NSTEMI (non-ST elevated myocardial infarction) ([place]) 2009   Osteoporosis, s/p bisphophonates x 7 years, stopped in 2011    Pneumonia    PONV (postoperative nausea and vomiting)    Sacrum and coccyx fracture [facility])     Past Surgical History:  Procedure Laterality Date    APPENDECTOMY     CARDIAC CATHETERIZATION  2009   CARPAL TUNNEL RELEASE Bilateral    cateract syrgery Bilateral 10/2018 and 11/2018   HIP SURGERY  1980s   due to infection    LAPAROSCOPIC HYSTERECTOMY     LEFT HEART CATHETERIZATION WITH CORONARY ANGIOGRAM N/A 11/18/2013   Procedure: LEFT HEART CATHETERIZATION WITH CORONARY ANGIOGRAM;  Surgeon: Blane Ohara, MD;  Location[facility] CATH LAB;  Service: Cardiovascular;  Laterality: N/A;   Left interior parathyroiectomy     Dr. Harlow Asa 08-14-18   LUMBAR LAMINECTOMY/DECOMPRESSION MICRODISCECTOMY  04/17/2012   Procedure: LUMBAR LAMINECTOMY/DECOMPRESSION MICRODISCECTOMY 1 LEVEL;  Surgeon: Erline Levine, MD;  Location: [place] NEURO ORS;  Service: Neurosurgery;  Laterality: Left;  Left Lumbar five-Sacral one Microdiskectomy   PARATHYROIDECTOMY Left 08/14/2018   Procedure: LEFT INFERIOR PARATHYROIDECTOMY;  Surgeon: Armandina Gemma, MD;  Location: [facility] ORS;  Service: General;  Laterality: Left;   SPINE SURGERY  03/2018   Dr Vertell Limber   TONSILLECTOMY      Current Medications: Current Meds  Medication Sig   amLODipine (NORVASC) 5 MG tablet Take 1 tablet (5 mg total) by mouth daily.   aspirin 81 MG tablet Take 81 mg by mouth daily.     atorvastatin (LIPITOR) 10 MG tablet Take 1 tablet by mouth 3 times a week.   Cholecalciferol (VITAMIN D) 2000 UNITS CAPS Take 1 capsule (2,000 Units total) by mouth daily.   clobetasol (TEMOVATE) 0.05 % external  solution Apply 1 application topically daily as needed. to scalp.   Coenzyme Q10 (CO Q-10) 300 MG CAPS Take 300 mg by mouth daily.    Desoximetasone (TOPICORT) 0.25 % ointment Apply 1 application topically 2 (two) times daily as needed (itching). Apply to hands.    EPINEPHrine (EPIPEN) 0.3 mg/0.3 mL SOAJ injection Inject 0.3 mLs (0.3 mg total) into the muscle once.   ezetimibe (ZETIA) 10 MG tablet Take 1 tablet (10 mg total) by mouth daily.   levothyroxine (SYNTHROID, LEVOTHROID) 88 MCG tablet Take 88 mcg by  mouth daily before breakfast.   Multiple Vitamins-Minerals (EYE VITAMINS) TABS Take 1 tablet by mouth 2 (two) times daily.    nitroGLYCERIN (NITROSTAT) 0.4 MG SL tablet PLACE 1 TABLET UNDER THE                 TONGUE AND ALLOW TO                      DISSOLVE EVERY 5 MINUTES AS              NEEDED FOR CHEST PAIN AS DIRECTED BY YOUR DOCTOR   SYNTHROID 75 MCG tablet TAKE 1 TABLET DAILY     Allergies:   Shellfish allergy, Codeine, Imdur [isosorbide nitrate], Lanolin, Latex, Pollen extract, Statins, Cephalexin, Ciprofloxacin, Clindamycin/lincomycin, Erythromycin, Penicillins, Propylene glycol, Sulfa antibiotics, and Tetracycline   Social History   Socioeconomic History   Marital status: Married    Spouse name: Not on file   Number of children: 2   Years of education: Not on file   Highest education level: Not on file  Occupational History   Occupation: retired  Scientist, product/process development strain: Not on file   Food insecurity    Worry: Not on file    Inability: Not on Lexicographer needs    Medical: Not on file    Non-medical: Not on file  Tobacco Use   Smoking status: Never Smoker   Smokeless tobacco: Never Used  Substance and Sexual Activity   Alcohol use: Yes    Alcohol/week: 1.0 standard drinks    Types: 1 Glasses of wine per week    Comment: occasional glass of wine   Drug use: No   Sexual activity: Not Currently  Lifestyle   Physical activity    Days per week: Not on file    Minutes per session: Not on file   Stress: Not on file  Relationships   Social connections    Talks on phone: Not on file    Gets together: Not on file    Attends religious service: Not on file    Active member of club or organization: Not on file    Attends meetings of clubs or organizations: Not on file    Relationship status: Not on file  Other Topics Concern   Not on file  Social History Narrative   Not on file     Family History: The patient's  family history includes Colon cancer (age of onset: 10) in her mother; Stroke in her unknown relative; Stroke (age of onset: 28) in her father.  ROS:   Please see the history of present illness.    All other systems reviewed and are negative.  EKGs/Labs/Other Studies Reviewed:    The following studies were reviewed today: Myoview Scan 02-20-2018: Study Highlights    Nuclear stress EF: 75%.  There was no ST segment deviation noted during stress.  The left  ventricular ejection fraction is hyperdynamic (>65%).   Poor quality study with diffusely low counts in apex and inferior wall on all images No evidence of ischemia EF 75%   Cardiac Cath 11-18-2013: Procedure: Left Heart Cath, Selective Coronary Angiography, LV angiography, FFR of the LAD  Indication: CCS Class 3 angina                                   Procedural Details: The right wrist was prepped, draped, and anesthetized with 1% lidocaine. Using the modified Seldinger technique, a 5/6 French sheath was introduced into the right radial artery. 3 mg of verapamil was administered through the sheath, weight-based unfractionated heparin was administered intravenously. Standard Judkins catheters were used for selective coronary angiography and left ventriculography. Catheter exchanges were performed over an exchange length guidewire.   Following the diagnostic procedure I elected to perform pressure wire analysis of the LAD. The patient had moderate stenosis in the mid LAD with heavy calcification. I compared her films to her previous study from 2009. There is some progression of disease and considering her anginal symptoms I felt this should be further interrogated. She was given additional IV heparin. A therapeutic ACT was achieved. A 6 French XB LAD 3.5 cm guide catheter was inserted. Intra-coronary nitroglycerin was administered. The pressure wire was zeroed and normalized at the tip of the guide catheter. The wire was advanced  beyond the lesion in the mid LAD. Intravenous adenosine was administered. The FFR at peak hyperemia was 0.80. Considering the length of the lesion and heavy calcification with a borderline FFR, I elected to treat her medically as an initial approach. There were no immediate procedural complications. A TR band was used for radial hemostasis at the completion of the procedure.  The patient was transferred to the post catheterization recovery area for further monitoring.  Procedural Findings: Hemodynamics: AO 126/68 with a mean of 94 LV 124/8  Coronary angiography: Coronary dominance: right  Left mainstem: The left main is moderately calcified. The vessel has 30% distal stenosis. There is no high-grade disease noted.  Left anterior descending (LAD): The LAD is heavily calcified. The ostium has 20% stenosis. The proximal vessel has heavy calcification with no significant stenosis. At the first septal perforator there is 40-50% stenosis. The second diagonal branch there is 60-70% stenosis. There is a long area of segmental disease from the first perforators through the second diagonal branch. The mid and distal LAD have no significant obstructive disease beyond the second diagonal.  Left circumflex (LCx): The left circumflex is patent. The obtuse marginal at the site of previous balloon angioplasty is patent without significant residual stenosis. There are 4 OM branches with no significant disease the there is moderate circumflex calcification as well  Right coronary artery (RCA): The RCA is moderately calcified. There is mild nonobstructive plaque in the proximal vessel. The mid and distal vessel had no significant disease. The PDA and PLA branches are patent without significant disease.  Left ventriculography: Left ventricular systolic function is normal, LVEF is estimated at 55-65%, there is no significant mitral regurgitation   Final Conclusions:   1. Moderate diffuse mid LAD stenosis  with borderline FFR of 0.80 2. Minor nonobstructive disease in the left circumflex and right coronary arteries 3. Normal LV systolic function  Recommendations: The patient has some progression of LAD stenosis compared to her 2009 cath. Angiographically there is moderate effused disease through the midsegment.  By pressure wire analysis there is borderline hemodynamic significance. I think medical therapy is indicated for an initial approach. She was just recently started on metoprolol 25 mg 3 times a day. I'm going to add Imdur 15 mg daily. She's had some sensitivity to medication in the past. We'll bring her back for early followup in medicine titration with Truitt Merle. If she fails medical therapy, PCI for LAD would be reasonable.  Recent Labs: 04/23/2019: ALT 26; BUN 15; Creatinine, Ser 0.81; Hemoglobin 13.3; Magnesium 2.1; Platelets 263; Potassium 4.5; Sodium 140 06/05/2019: TSH 0.64  Recent Lipid Panel    Component Value Date/Time   CHOL 144 04/23/2019 0916   TRIG 77 04/23/2019 0916   HDL 50 04/23/2019 0916   CHOLHDL 2.9 04/23/2019 0916   CHOLHDL 3.4 10/06/2015 0753   VLDL 25 10/06/2015 0753   LDLCALC 79 04/23/2019 0916    Physical Exam:    VS:  BP 120/70    Pulse 89    Ht 5\' 4"  (1.626 m)    Wt 154 lb 3.2 oz (69.9 kg)    SpO2 97%    BMI 26.47 kg/m     Wt Readings from Last 3 Encounters:  07/08/19 154 lb 3.2 oz (69.9 kg)  04/23/19 153 lb (69.4 kg)  10/23/18 154 lb (69.9 kg)     GEN: Well nourished, well developed in no acute distress HEENT: Normal NECK: No JVD; No carotid bruits LYMPHATICS: No lymphadenopathy CARDIAC: RRR, no murmurs, rubs, gallops RESPIRATORY:  Clear to auscultation without rales, wheezing or rhonchi  ABDOMEN: Soft, non-tender, non-distended MUSCULOSKELETAL:  No edema; No deformity  SKIN: Warm and dry NEUROLOGIC:  Alert and oriented x 3 PSYCHIATRIC:  Normal affect   ASSESSMENT:    1. Coronary artery disease involving native coronary artery of native  heart with angina pectoris ([place])   2. Essential hypertension, benign   3. Mixed hyperlipidemia    PLAN:    In order of problems listed above:  1. The patient has atypical angina that occurs primarily when she is fatigued.  Her medical program is reviewed.  She has been unable to take isosorbide in the past because of headache.  I think treatment options with medical therapy are fairly limited.  I discussed repeating a stress test (her last study was done about 16 months ago).  She does not want to pursue this at present.  She prefers to stay on her current medications.  She will use nitroglycerin as needed.  I asked her to contact me immediately if she notices progression of symptoms. 2. Blood pressure is well controlled on amlodipine. 3. The patient is intolerant to statin drugs.  She has tried multiple agents at low doses.  She takes ezetimibe.  Most recent labs show a cholesterol 144, HDL 50, LDL 93, triglycerides 93.  She has not been interested in pursuing a PCSK9 inhibitor.  For follow-up I would like to see the patient back in 6 months.  I advised her to contact me or seek immediate medical attention if her chest pain pattern intensifies or progresses.   Medication Adjustments/Labs and Tests Ordered: Current medicines are reviewed at length with the patient today.  Concerns regarding medicines are outlined above.  No orders of the defined types were placed in this encounter.  No orders of the defined types were placed in this encounter.   There are no Patient Instructions on file for this visit.   Signed, Victoria Mocha, MD  07/08/2019 8:37 AM  [facility]

## 2019-07-19 ENCOUNTER — Other Ambulatory Visit: Payer: Self-pay | Admitting: Internal Medicine

## 2019-07-19 ENCOUNTER — Telehealth: Payer: Self-pay

## 2019-07-19 DIAGNOSIS — E213 Hyperparathyroidism, unspecified: Secondary | ICD-10-CM

## 2019-07-19 DIAGNOSIS — M810 Age-related osteoporosis without current pathological fracture: Secondary | ICD-10-CM

## 2019-07-19 NOTE — Telephone Encounter (Signed)
Patient called in wanting to get a Bone Dentisty  appointment scheduled. She states that it has to be done after November 5 but before the end of November. Not sure if we schedule these appointments or what direction I need to point her in.   Please call patient and advise

## 2019-07-19 NOTE — Telephone Encounter (Signed)
No worries - these usually come to me and I order them Melissa, I ordered this at [facility] - this needs to be scheduled after 11/05 - can you let them know? Ty!

## 2019-07-23 ENCOUNTER — Encounter: Payer: Self-pay | Admitting: Internal Medicine

## 2019-07-23 NOTE — Telephone Encounter (Signed)
This was address in [place].

## 2019-08-12 ENCOUNTER — Other Ambulatory Visit: Payer: Self-pay

## 2019-08-12 ENCOUNTER — Ambulatory Visit (INDEPENDENT_AMBULATORY_CARE_PROVIDER_SITE_OTHER)
Admission: RE | Admit: 2019-08-12 | Discharge: 2019-08-12 | Disposition: A | Discharge status: Home / Self Care | Payer: Medicare Other | Source: Ambulatory Visit | Attending: Internal Medicine | Admitting: Internal Medicine

## 2019-08-12 DIAGNOSIS — E213 Hyperparathyroidism, unspecified: Secondary | ICD-10-CM | POA: Diagnosis not present

## 2019-08-12 DIAGNOSIS — M810 Age-related osteoporosis without current pathological fracture: Secondary | ICD-10-CM | POA: Diagnosis not present

## 2019-08-12 IMAGING — MR MR LUMBAR SPINE WO/W CM
4 of 8 series · 26 of 48 positions shown · IV contrast (multihance)
Comparison: 04/10/2012

CLINICAL DATA: Lumbar radiculopathy. Right hip and right leg pain
for several months. Right leg weakness. Prior lumbar surgery.

Creatinine was obtained on site at [HOSPITAL] at [HOSPITAL].
Results: Creatinine 0.8 mg/dL.
EXAM:
MRI LUMBAR SPINE WITHOUT AND WITH CONTRAST
TECHNIQUE: Multiplanar and multiecho pulse sequences of the lumbar spine were
obtained without and with intravenous contrast.
CONTRAST:  14mL MULTIHANCE GADOBENATE DIMEGLUMINE 529 MG/ML IV SOLN

[Series 4: T1 · sagittal · 4.0mm · 0.59mm/px · 3 of 15 slices shown (1 of 2)]
[im 1/15]
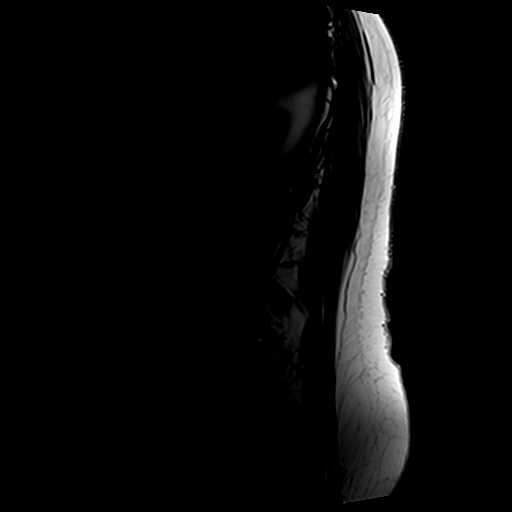
[im 8/15]
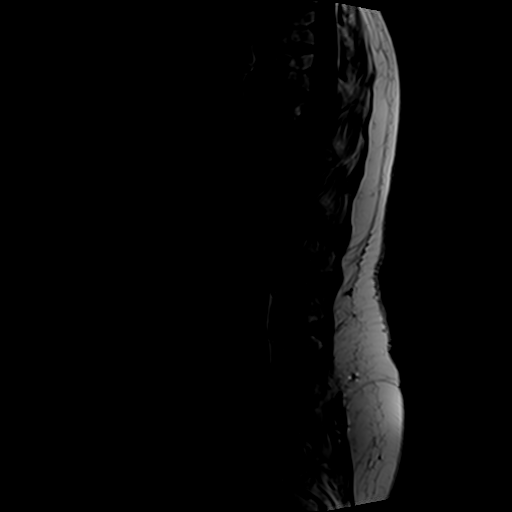
[im 15/15]
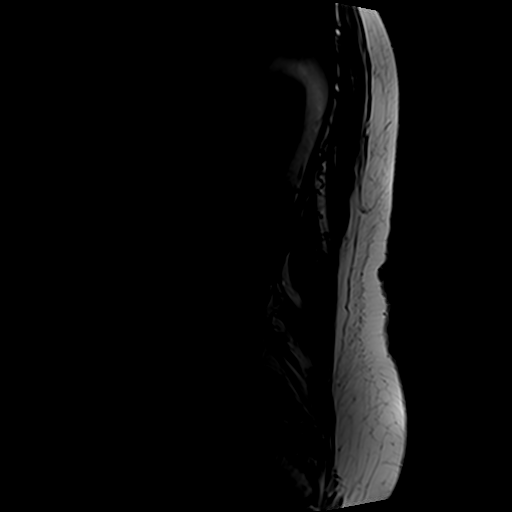

[Series 5: T2 · axial · 4.0mm · 0.70mm/px · z∈[+95,+359]mm · 11 of 51 slices shown]
[im 1/51]
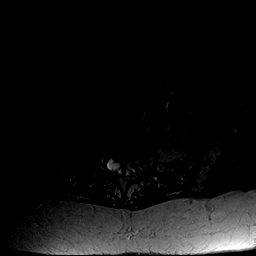
[im 6/51]
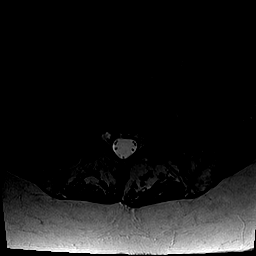
[im 11/51]
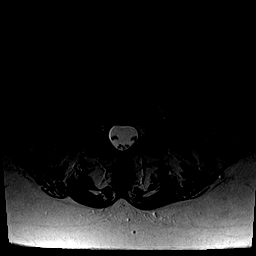
[im 16/51]
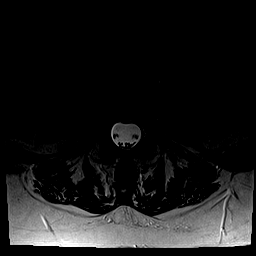
[im 21/51]
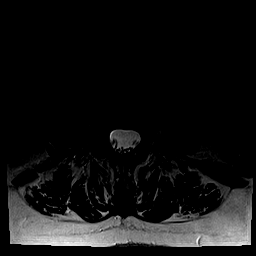
[im 26/51]
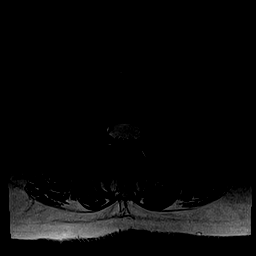
[im 31/51]
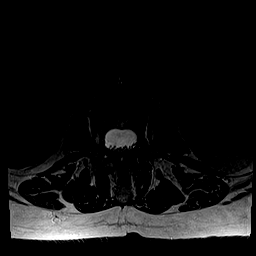
[im 36/51]
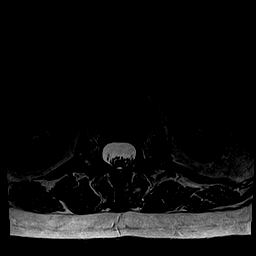
[im 41/51]
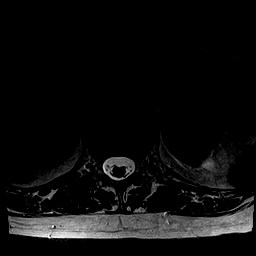
[im 46/51]
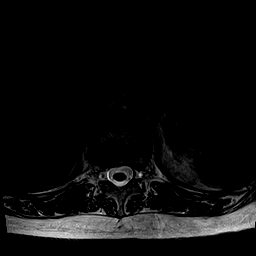
[im 51/51]
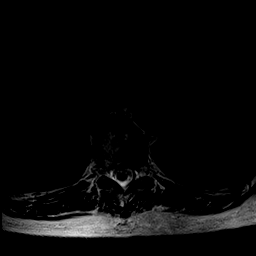

[Series 6: T1 · axial · 4.0mm · 0.35mm/px · z∈[+95,+333]mm · 9 of 51 slices shown (2 of 2)]
[im 1/51]
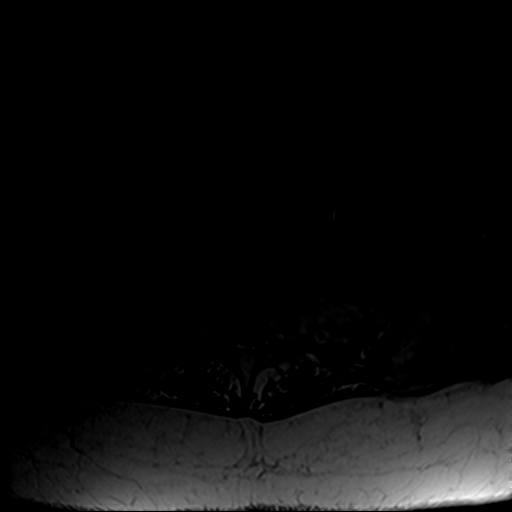
[im 6/51]
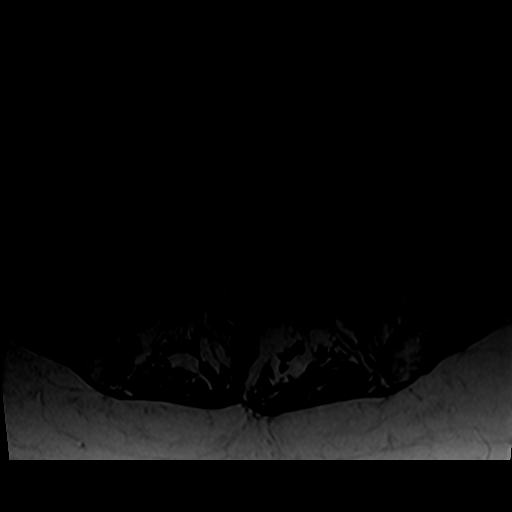
[im 11/51]
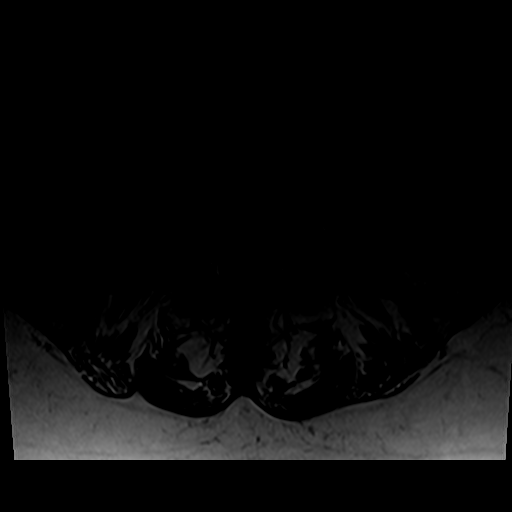
[im 16/51]
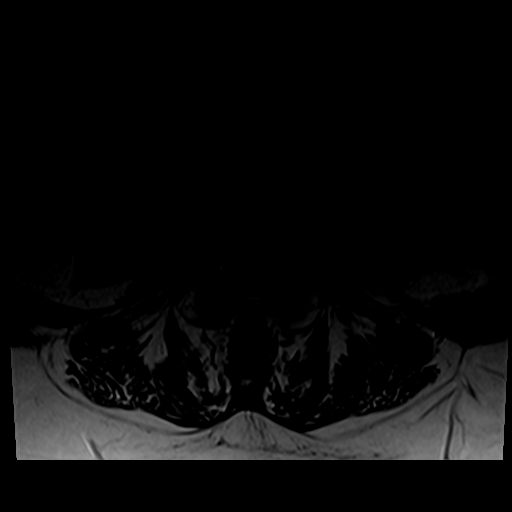
[im 21/51]
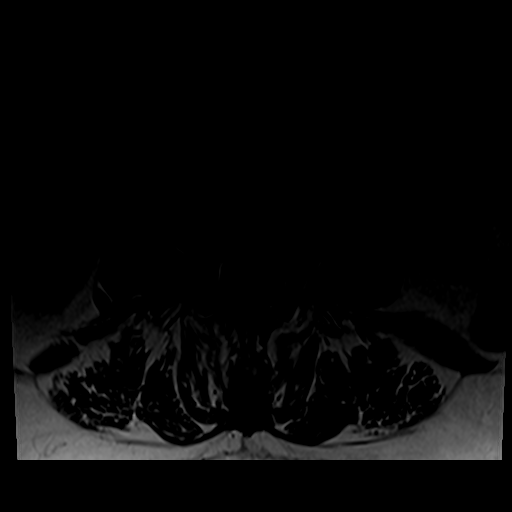
[im 26/51]
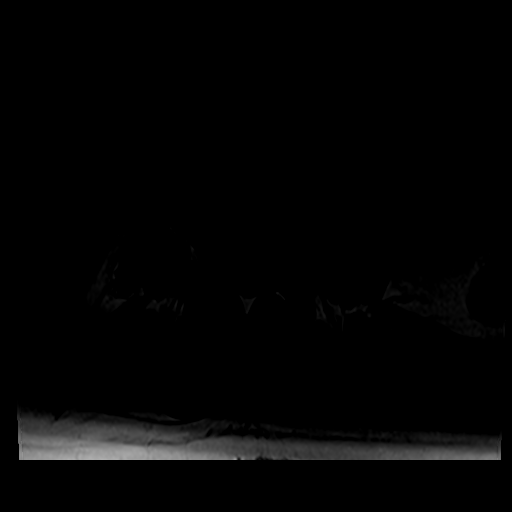
[im 31/51]
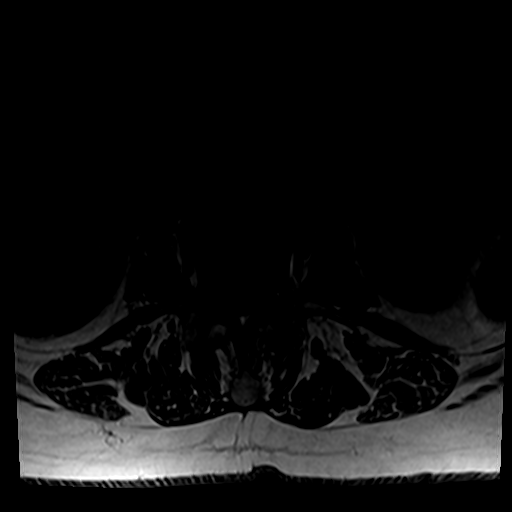
[im 36/51]
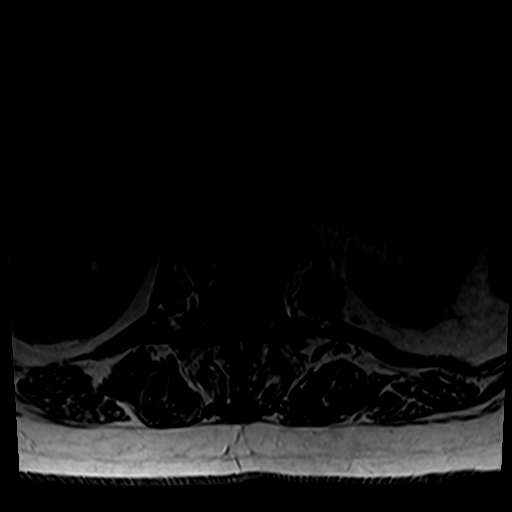
[im 46/51]
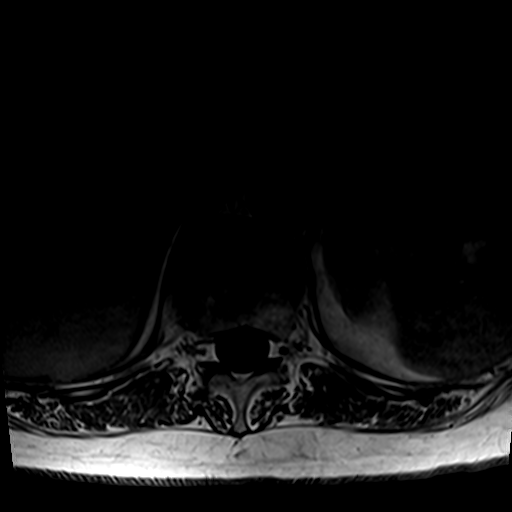

[Series 7: T2 post-contrast · sagittal · 4.0mm · 0.59mm/px · 3 of 15 slices shown]
[im 1/15]
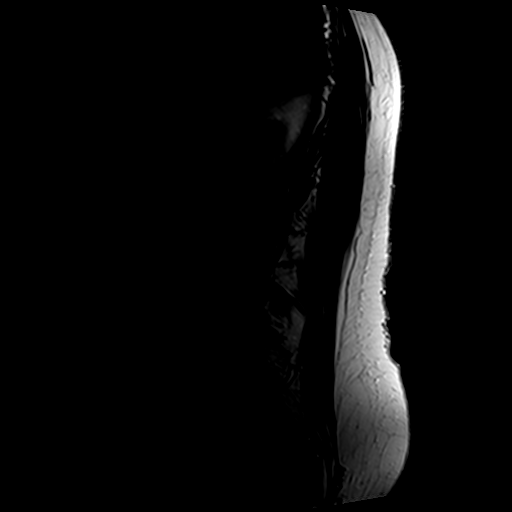
[im 8/15]
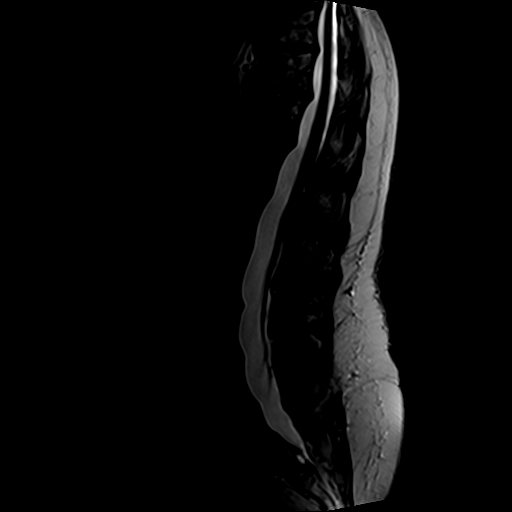
[im 15/15]
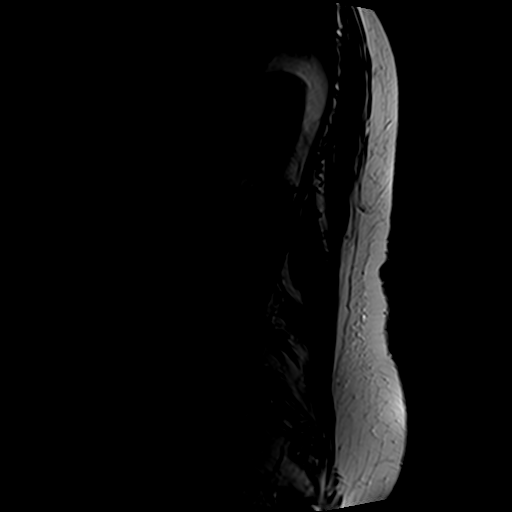

[26 of 48 positions shown; findings below may reference images not displayed]

FINDINGS: Segmentation:  Standard.

Alignment: Slight S-shaped thoracolumbar spine curvature. Trace
retrolisthesis of T12 on L1.

Vertebrae: No fracture or suspicious osseous lesion. Unchanged L1
vertebral body hemangioma. Mild degenerative endplate edema at
T11-12 and L3-4. Fatty degenerative endplate marrow changes at
T10-11. Scattered small Schmorl's nodes.

Conus medullaris and cauda equina: Conus extends to the L1-2 level.
Conus and cauda equina appear normal.

Paraspinal and other soft tissues: 11 mm left upper pole renal cyst.
12 mm left ovarian cyst, unchanged from a 04/07/2012 CT of the
abdomen and pelvis and likely benign.

Disc levels:

Disc desiccation throughout the lumbar and included lower thoracic
spine. Moderate disc space narrowing at T10-11, T11-12, L3-4, and
L4-5 with milder narrowing at T12-L1 and L5-S1.

T10-11: Minimal disc bulging without stenosis.

T11-12: Mild disc bulging without stenosis, unchanged.

T12-L1: Mild disc bulging without stenosis, unchanged.

L1-2: Minimal facet arthrosis without stenosis, unchanged.

L2-3: Minimal facet arthrosis without stenosis, unchanged.

L3-4: Progressive disc space narrowing asymmetric to the right, disc
bulging, endplate spurring, and mild facet hypertrophy result in new
mild right neural foraminal stenosis and minimal right lateral
recess narrowing without evidence of L3 or L4 nerve root
compression. No spinal stenosis.

L4-5: Mild disc bulging and mild facet hypertrophy without stenosis,
unchanged.

L5-S1: Interval left laminectomy. Shallow residual central disc
protrusion and mild facet arthrosis without stenosis.
IMPRESSION: 1. Progressive disc degeneration at L3-4 resulting in mild right
neural foraminal and right lateral recess stenosis.
2. Interval left laminectomy at L5-S1. Shallow residual central disc
protrusion without stenosis.
3. Unchanged mild disc and facet degeneration elsewhere without
stenosis.

## 2019-09-03 ENCOUNTER — Ambulatory Visit (INDEPENDENT_AMBULATORY_CARE_PROVIDER_SITE_OTHER): Payer: Medicare Other | Admitting: Internal Medicine

## 2019-09-03 ENCOUNTER — Encounter: Payer: Self-pay | Admitting: Internal Medicine

## 2019-09-03 ENCOUNTER — Other Ambulatory Visit: Payer: Self-pay

## 2019-09-03 DIAGNOSIS — M810 Age-related osteoporosis without current pathological fracture: Secondary | ICD-10-CM

## 2019-09-03 DIAGNOSIS — E559 Vitamin D deficiency, unspecified: Secondary | ICD-10-CM

## 2019-09-03 DIAGNOSIS — E213 Hyperparathyroidism, unspecified: Secondary | ICD-10-CM

## 2019-09-03 NOTE — Progress Notes (Signed)
Patient ID: Victoria [name], female   DOB: [birth year], 78 y.o.   MRN: [id number]   Patient location: Home My location: Office  Referring Provider: Briscoe Deutscher, DO  I connected with the patient on 09/03/19 at 9:58 AM EST by a video enabled telemedicine application and verified that I am speaking with the correct person.   I discussed the limitations of evaluation and management by telemedicine and the availability of in person appointments. The patient expressed understanding and agreed to proceed.   Details of the encounter are shown below.  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  Last visit 10 months ago.  She has had hypercalcemia since at least 2015.  Reviewed pertinent labs: Postop labs: PTH 53, calcium 9.7 Lab Results  Component Value Date   PTH 32 10/23/2018   PTH Comment 10/23/2018   PTH 71 (H) 06/27/2018   PTH 135 (H) 05/11/2018   PTH 62.4 04/07/2009   CALCIUM 9.6 04/23/2019   CALCIUM 9.9 10/23/2018   CALCIUM 10.6 (H) 08/06/2018   CALCIUM 11.0 (H) 06/27/2018   CALCIUM 10.8 (H) 05/11/2018   CALCIUM 11.0 (H) 05/11/2018   CALCIUM 10.7 (H) 05/03/2018   CALCIUM 10.3 06/08/2017   CALCIUM 10.9 (H) 04/13/2017   CALCIUM 11.0 (H) 05/17/2016  03/25/2011: Calcium 10.1 (8.6-10.2) 07/22/2010, calcium 10 (8.6-10.2)  She had a high calcitriol, normal phosphorus and magnesium Component     Latest Ref Rng & Units 06/27/2018          Vitamin D 1, 25 (OH) Total     18 - 72 pg/mL 84 (H)  Vitamin D3 1, 25 (OH)     pg/mL 84  Vitamin D2 1, 25 (OH)     pg/mL <8  Phosphorus     2.3 - 4.6 mg/dL 2.9  Magnesium     1.5 - 2.5 mg/dL 2.3   Urine calcium level was elevated: Component     Latest Ref Rng & Units 07/04/2018          Creatinine, 24H Ur     0.50 - 2.15 g/24 h 0.88  Calcium, 24H Urine     mg/24 h 273 (H)   08/14/2018: left inferior  parathyroidectomy by Dr. Harlow Asa.  Pathology showed a 0.4 g hypercellular parathyroid, enlarged, measuring 1.2 x 1.2 x 0.5 cm.  Review latest DXA scan reports which show osteoporosis:  Lumbar spine L1-L4 Femoral neck (FN) 33% distal radius  T-score -2.7 RFN: -3.0 LFN: -3.1 -2.9  Change in BMD from previous DXA test (%)  +0.5%  +0.1% n/a  (*) statistically significant  08/07/2017 Lumbar spine L1-L4 Femoral neck (FN)  T-score -2.7 RFN: -3.1 LFN: -3.1  Change in BMD from previous DXA test from 2015 (%) -3.4%* -7.9%*  (*) statistically significant  No fractures or falls.   She was on Fosamax in 1990's- early 2000s, for ~10 years. She tolerated this well.  She is doing weightbearing exercises 2-3 times a week.  No history of kidney stones.  No CKD.  Reviewed her latest BUN/creatinine: Lab Results  Component Value Date   BUN 15 04/23/2019   BUN 20 08/06/2018   CREATININE 0.81 04/23/2019   CREATININE 0.94 08/06/2018   She has a history of vitamin D deficiency but recent vitamin D levels were normal: Lab Results  Component Value Date   VD25OH 43.92 10/23/2018   VD25OH 40.03 05/11/2018   VD25OH 36 01/23/2014  VD25OH 39 03/12/2012   VD25OH 39 03/07/2011   VD25OH 32 04/07/2009  07/30/2010: Vitamin D 27.2  She continues on vitamin D 2000 units daily.  Pt does not have a FH of hypercalcemia, pituitary tumors, thyroid cancer, or osteoporosis.   She also has a history of controlled hypothyroidism, on levothyroxine.  Review latest TSH which was normal Lab Results  Component Value Date   TSH 0.64 06/05/2019   TSH 2.040 05/03/2018   TSH 0.88 07/05/2017   TSH 0.14 (L) 04/13/2017   TSH 0.52 05/17/2016   ROS: Constitutional: no weight gain/no weight loss, no fatigue, no subjective hyperthermia, no subjective hypothermia Eyes: no blurry vision, no xerophthalmia ENT: no sore throat, no nodules palpated in neck, no dysphagia, no odynophagia, no hoarseness Cardiovascular: no CP/no  SOB/no palpitations/no leg swelling Respiratory: no cough/no SOB/no wheezing Gastrointestinal: no N/no V/no D/no C/no acid reflux Musculoskeletal: no muscle aches/no joint aches Skin: no rashes, no hair loss Neurological: no tremors/no numbness/no tingling/no dizziness  I reviewed pt's medications, allergies, PMH, social hx, family hx, and changes were documented in the history of present illness. Otherwise, unchanged from my initial visit note.  Past Medical History:  Diagnosis Date  . Acute lumbar radiculopathy 04/2012  . Anemia    history of  . Anxiety   . Atopic eczema   . CAD (coronary artery disease)   . Complication of anesthesia   . Depression   . Dyspnea    with activity  . Essential hypertension, benign   . Hyperlipidemia, statin intolerant   . Hypothyroidism   . Myocardial infarction ([place]) 2009  . NSTEMI (non-ST elevated myocardial infarction) ([place]) 2009  . Osteoporosis, s/p bisphophonates x 7 years, stopped in 2011   . Pneumonia   . PONV (postoperative nausea and vomiting)   . Sacrum and coccyx fracture [facility])    Past Surgical History:  Procedure Laterality Date  . APPENDECTOMY    . CARDIAC CATHETERIZATION  2009  . CARPAL TUNNEL RELEASE Bilateral   . cateract syrgery Bilateral 10/2018 and 11/2018  . HIP SURGERY  1980s   due to infection   . LAPAROSCOPIC HYSTERECTOMY    . LEFT HEART CATHETERIZATION WITH CORONARY ANGIOGRAM N/A 11/18/2013   Procedure: LEFT HEART CATHETERIZATION WITH CORONARY ANGIOGRAM;  Surgeon: Blane Ohara, MD;  Location: [facility] CATH LAB;  Service: Cardiovascular;  Laterality: N/A;  . Left interior parathyroiectomy     Dr. Harlow Asa 08-14-18  . LUMBAR LAMINECTOMY/DECOMPRESSION MICRODISCECTOMY  04/17/2012   Procedure: LUMBAR LAMINECTOMY/DECOMPRESSION MICRODISCECTOMY 1 LEVEL;  Surgeon: Erline Levine, MD;  Location: [facility] NEURO ORS;  Service: Neurosurgery;  Laterality: Left;  Left Lumbar five-Sacral one Microdiskectomy  . PARATHYROIDECTOMY Left 08/14/2018    Procedure: LEFT INFERIOR PARATHYROIDECTOMY;  Surgeon: Armandina Gemma, MD;  Location: [facility] ORS;  Service: General;  Laterality: Left;  . SPINE SURGERY  03/2018   Dr Vertell Limber  . TONSILLECTOMY     Social History   Socioeconomic History  . Marital status: Married    Spouse name: Not on file  . Number of children: 2  . Years of education: Not on file  . Highest education level: Not on file  Occupational History  . Occupation: retired  Scientific laboratory technician  . Financial resource strain: Not on file  . Food insecurity    Worry: Not on file    Inability: Not on file  . Transportation needs    Medical: Not on file    Non-medical: Not on file  Tobacco Use  . Smoking status:  Never Smoker  . Smokeless tobacco: Never Used  Substance and Sexual Activity  . Alcohol use: Yes    Alcohol/week: 1.0 standard drinks    Types: 1 Glasses of wine per week    Comment: occasional glass of wine  . Drug use: No  . Sexual activity: Not Currently  Lifestyle  . Physical activity    Days per week: Not on file    Minutes per session: Not on file  . Stress: Not on file  Relationships  . Social Herbalist on phone: Not on file    Gets together: Not on file    Attends religious service: Not on file    Active member of club or organization: Not on file    Attends meetings of clubs or organizations: Not on file    Relationship status: Not on file  . Intimate partner violence    Fear of current or ex partner: Not on file    Emotionally abused: Not on file    Physically abused: Not on file    Forced sexual activity: Not on file  Other Topics Concern  . Not on file  Social History Narrative  . Not on file   Current Outpatient Medications on File Prior to Visit  Medication Sig Dispense Refill  . amLODipine (NORVASC) 5 MG tablet Take 1 tablet (5 mg total) by mouth daily. 90 tablet 3  . aspirin 81 MG tablet Take 81 mg by mouth daily.      Marland Kitchen atorvastatin (LIPITOR) 10 MG tablet Take 1 tablet by mouth 3  times a week. 45 tablet 3  . Cholecalciferol (VITAMIN D) 2000 UNITS CAPS Take 1 capsule (2,000 Units total) by mouth daily. 30 capsule   . clobetasol (TEMOVATE) 0.05 % external solution Apply 1 application topically daily as needed. to scalp. 50 mL 1  . Coenzyme Q10 (CO Q-10) 300 MG CAPS Take 300 mg by mouth daily.     . Desoximetasone (TOPICORT) 0.25 % ointment Apply 1 application topically 2 (two) times daily as needed (itching). Apply to hands.     Marland Kitchen EPINEPHrine (EPIPEN) 0.3 mg/0.3 mL SOAJ injection Inject 0.3 mLs (0.3 mg total) into the muscle once. 1 Device 1  . ezetimibe (ZETIA) 10 MG tablet Take 1 tablet (10 mg total) by mouth daily. 90 tablet 3  . levothyroxine (SYNTHROID, LEVOTHROID) 88 MCG tablet Take 88 mcg by mouth daily before breakfast.    . Multiple Vitamins-Minerals (EYE VITAMINS) TABS Take 1 tablet by mouth 2 (two) times daily.     . nitroGLYCERIN (NITROSTAT) 0.4 MG SL tablet PLACE 1 TABLET UNDER THE                 TONGUE AND ALLOW TO                      DISSOLVE EVERY 5 MINUTES AS              NEEDED FOR CHEST PAIN AS DIRECTED BY YOUR DOCTOR 25 tablet 3  . SYNTHROID 75 MCG tablet TAKE 1 TABLET DAILY 90 tablet 3   No current facility-administered medications on file prior to visit.    Allergies  Allergen Reactions  . Shellfish Allergy Hives and Shortness Of Breath  . Codeine Other (See Comments)    Headache  . Imdur [Isosorbide Nitrate] Other (See Comments)    Confusion  . Lanolin Itching  . Latex Other (See Comments)    Unknown, allergy testing  result  . Pollen Extract   . Statins     Simvastatin, Lipitor (both caused muscle aches); Crestor 5 mg and 10 mg daily (memory impairment), Livalo (throat and mouth itching), pravastatin 10 mg qd (memory)  . Cephalexin Itching and Rash  . Ciprofloxacin Rash  . Clindamycin/Lincomycin Rash  . Erythromycin Itching and Rash  . Penicillins Itching and Rash  . Propylene Glycol Itching and Rash  . Sulfa Antibiotics Itching and  Rash  . Tetracycline Itching and Rash   Family History  Problem Relation Age of Onset  . Colon cancer Mother 58       1st degree  . Stroke Father 44  . Stroke Unknown        Grandfather- grandmother    PE: There were no vitals taken for this visit. Wt Readings from Last 3 Encounters:  07/08/19 154 lb 3.2 oz (69.9 kg)  04/23/19 153 lb (69.4 kg)  10/23/18 154 lb (69.9 kg)   Constitutional:  in NAD  The physical exam was not performed (virtual visit).  Assessment: 1. Hypercalcemia/hyperparathyroidism  2.  History of vitamin D deficiency  3.  Osteoporosis  Plan: Patient with history of several instances of elevated calcium, with the highest level being at 11.  Intact PTH was also high, at 135 for a corresponding calcium of 10.8.  Her vitamin D level was normal, as was her magnesium and phosphorus.  Calcitriol level was high and 24-hour urine calcium was also high.  She does not have a history of nephrolithiasis, but does have osteoporosis.  At last visit, she denied fractures, abdominal pain, depression, bone pain.  Suspicion was high for primary hyperparathyroidism and I referred her to surgery.  She had right inferior parathyroidectomy by Dr. Harlow Asa 08/14/2018. -Her calcium and PTH levels decreased after surgery, and the most recent calcium level was 9.6 on 04/23/2019.  She is feeling well at this visit, without complaints -When she returns to the clinic I will recheck her calcium.  2.  History of vitamin D deficiency -Reviewed most recent vitamin D level which was normal in 10/2018 -We will need to recheck her level as soon as she returns to the clinic  3.  Osteoporosis -Reviewed the most recent bone density report: She has osteoporosis and her T-scores were lower than those from 2015.  Since last bone density, she started weightbearing exercises 2-3 times a week in an effort to improve her T-scores.  Now that she had parathyroidectomy, I would like to see another bone density  scan before deciding for or against further treatment.  She may need antiresorptive or anabolic medications afterwards. -We reviewed together her latest bone density from 08/12/2019 and this was stable, however, due to the low T-scores, she is still at high risk for fractures.   -At this visit, we discussed about different options for treatment, expected effects (decrease fracture risk by approximately 50%) and side effects (including ONJ and atypical fractures, which, I explained, are quite rare).  My suggestion for her would be either Prolia or Reclast.  She does not have dental work in progress or planned.  She would like to review the information about the medicines (sent to her via AVS) and let me know about her choice.  In the meantime, we will continue vitamin D 2000 units daily and I advised her to make sure she is getting 1200 mg calcium daily, preferentially from the diet. -I will then plan to repeat her DXA scan in 2 years.  I explained  that increased her stable bone density scores are desirable.  Ultimately, the goal is no fractures. -I will see the patient back in a year  Orders Placed This Encounter  Procedures  . VITAMIN D 25 Hydroxy (Vit-D Deficiency, Fractures)  . BASIC METABOLIC PANEL WITH GFR   Philemon Kingdom, MD PhD [facility] Endocrinology

## 2019-09-03 NOTE — Patient Instructions (Addendum)
Please make sure you are getting 1200 mg calcium a day, preferably from the diet.  Please look up OsteoStrong for skeletal loading.  Please look up Prolia and Reclast.  Denosumab: Patient drug information (Up-to-date) Copyright [phone] [place] rights reserved.  Brand Names: [place]  ProliaDelton See What is this drug used for?  It is used to treat soft, brittle bones (osteoporosis).  It is used for bone growth.  It is used when treating some cancers.  It may be given to you for other reasons. Talk with the doctor. What do I need to tell my doctor BEFORE I take this drug?  All products:  If you have an allergy to denosumab or any other part of this drug.  If you are allergic to any drugs like this one, any other drugs, foods, or other substances. Tell your doctor about the allergy and what signs you had, like rash; hives; itching; shortness of breath; wheezing; cough; swelling of face, lips, tongue, or throat; or any other signs.  If you have low calcium levels.  Prolia:  If you are pregnant or may be pregnant. Do not take this drug if you are pregnant.  This is not a list of all drugs or health problems that interact with this drug.  Tell your doctor and pharmacist about all of your drugs (prescription or OTC, natural products, vitamins) and health problems. You must check to make sure that it is safe for you to take this drug with all of your drugs and health problems. Do not start, stop, or change the dose of any drug without checking with your doctor. What are some things I need to know or do while I take this drug?  All products:  Tell dentists, surgeons, and other doctors that you use this drug.  This drug may raise the chance of a broken leg. Talk with your doctor.  Have your blood work checked. Talk with your doctor.  Have a bone density test. Talk with your doctor.  Take calcium and vitamin D as you were told by your doctor.  Have a dental exam before  starting this drug.  Take good care of your teeth. See a dentist often.  If you smoke, talk with your doctor.  Do not give to a child. Talk with your doctor.  Tell your doctor if you are breast-feeding. You will need to talk about any risks to your baby.  Delton See:  This drug may cause harm to the unborn baby if you take it while you are pregnant. If you get pregnant while taking this drug, call your doctor right away.  Prolia:  Very bad infections have been reported with use of this drug. If you have any infection, are taking antibiotics now or in the recent past, or have many infections, talk with your doctor.  You may have more chance of getting an infection. Wash hands often. Stay away from people with infections, colds, or flu.  Use birth control that you can trust to prevent pregnancy while taking this drug.  If you are a man and your sex partner is pregnant or gets pregnant at any time while you are being treated, talk with your doctor. What are some side effects that I need to call my doctor about right away?  WARNING/CAUTION: Even though it may be rare, some people may have very bad and sometimes deadly side effects when taking a drug. Tell your doctor or get medical help right away if you have  any of the following signs or symptoms that may be related to a very bad side effect:  All products:  Signs of an allergic reaction, like rash; hives; itching; red, swollen, blistered, or peeling skin with or without fever; wheezing; tightness in the chest or throat; trouble breathing or talking; unusual hoarseness; or swelling of the mouth, face, lips, tongue, or throat.  Signs of low calcium levels like muscle cramps or spasms, numbness and tingling, or seizures.  Mouth sores.  Any new or strange groin, hip, or thigh pain.  This drug may cause jawbone problems. The chance may be higher the longer you take this drug. The chance may be higher if you have cancer, dental problems,  dentures that do not fit well, anemia, blood clotting problems, or an infection. The chance may also be higher if you are having dental work or if you are getting chemo, some steroid drugs, or radiation. Call your doctor right away if you have jaw swelling or pain.  Xgeva:  Not hungry.  Muscle pain or weakness.  Seizures.  Shortness of breath.  Prolia:  Signs of infection. These include a fever of 100.22F (38C) or higher, chills, very bad sore throat, ear or sinus pain, cough, more sputum or change in color of sputum, pain with passing urine, mouth sores, wound that will not heal, or anal itching or pain.  Signs of a pancreas problem (pancreatitis) like very bad stomach pain, very bad back pain, or very bad upset stomach or throwing up.  Chest pain.  A heartbeat that does not feel normal.  Very bad skin irritation.  Feeling very tired or weak.  Bladder pain or pain when passing urine or change in how much urine is passed.  Passing urine often.  Swelling in the arms or legs. What are some other side effects of this drug?  All drugs may cause side effects. However, many people have no side effects or only have minor side effects. Call your doctor or get medical help if any of these side effects or any other side effects bother you or do not go away:  Xgeva:  Feeling tired or weak.  Headache.  Upset stomach or throwing up.  Loose stools (diarrhea).  Cough.  Prolia:  Back pain.  Muscle or joint pain.  Sore throat.  Runny nose.  Pain in arms or legs.  These are not all of the side effects that may occur. If you have questions about side effects, call your doctor. Call your doctor for medical advice about side effects.  You may report side effects to your national health agency. How is this drug best taken?  Use this drug as ordered by your doctor. Read and follow the dosing on the label closely.  It is given as a shot into the fatty part of the skin. What do  I do if I miss a dose?  Call the doctor to find out what to do. How do I store and/or throw out this drug?  This drug will be given to you in a hospital or doctor's office. You will not store it at home.  Keep all drugs out of the reach of children and pets.  Check with your pharmacist about how to throw out unused drugs.  General drug facts  If your symptoms or health problems do not get better or if they become worse, call your doctor.  Do not share your drugs with others and do not take anyone else's drugs.  Keep a  list of all your drugs (prescription, natural products, vitamins, OTC) with you. Give this list to your doctor.  Talk with the doctor before starting any new drug, including prescription or OTC, natural products, or vitamins.  Some drugs may have another patient information leaflet. If you have any questions about this drug, please talk with your doctor, pharmacist, or other health care provider.  If you think there has been an overdose, call your poison control center or get medical care right away. Be ready to tell or show what was taken, how much, and when it happened.  Zoledronic acid: Patient drug information (Up-to-Date) Copyright [phone] [place] rights reserved.  Brand Names: [place]  Reclast;  Zometa What is this drug used for?  It is used to treat high calcium levels.  It is used when treating some cancers.  It is used to treat Paget's disease.  It is used to put off or treat soft, brittle bones (osteoporosis).  It may be given to you for other reasons. Talk with the doctor. What do I need to tell my doctor BEFORE I take this drug?  All products:  If you have an allergy to zoledronic acid or any other part of this drug.  If you are allergic to any drugs like this one, any other drugs, foods, or other substances. Tell your doctor about the allergy and what signs you had, like rash; hives; itching; shortness of breath; wheezing; cough;  swelling of face, lips, tongue, or throat; or any other signs.  Reclast:  If you have low calcium levels.  If you have very bad kidney disease.  This is not a list of all drugs or health problems that interact with this drug.  Tell your doctor and pharmacist about all of your drugs (prescription or OTC, natural products, vitamins) and health problems. You must check to make sure that it is safe for you to take this drug with all of your drugs and health problems. Do not start, stop, or change the dose of any drug without checking with your doctor. What are some things I need to know or do while I take this drug?  All products:  Tell dentists, surgeons, and other doctors that you use this drug.  Worsening of asthma has happened in people taking drugs like this one. Talk with your doctor.  This drug may raise the chance of a broken leg. Talk with your doctor.  Have your blood work checked often. Talk with your doctor.  Have a bone density test. Talk with your doctor.  Have a dental exam before starting this drug.  Take good care of your teeth. See a dentist often.  Do not give to a child. Talk with your doctor.  If you are 66 or older, use this drug with care. You could have more side effects.  This drug may cause harm to the unborn baby if you take it while you are pregnant.  Tell your doctor if you are pregnant or plan on getting pregnant. You will need to talk about the benefits and risks of using this drug while you are pregnant.  Tell your doctor if you are breast-feeding. You will need to talk about any risks to your baby.  Zometa:  Take calcium and vitamin D as you were told by your doctor.  Reclast:  This drug works best when used with calcium/vitamin D and weight-bearing workouts like walking or PT (physical therapy).  Follow the diet and workout plan that your  doctor told you about.  Use birth control that you can trust to prevent pregnancy while taking this  drug. What are some side effects that I need to call my doctor about right away?  WARNING/CAUTION: Even though it may be rare, some people may have very bad and sometimes deadly side effects when taking a drug. Tell your doctor or get medical help right away if you have any of the following signs or symptoms that may be related to a very bad side effect:  Signs of an allergic reaction, like rash; hives; itching; red, swollen, blistered, or peeling skin with or without fever; wheezing; tightness in the chest or throat; trouble breathing or talking; unusual hoarseness; or swelling of the mouth, face, lips, tongue, or throat.  Signs of low calcium levels like muscle cramps or spasms, numbness and tingling, or seizures.  Signs of kidney problems like unable to pass urine, change in the amount of urine passed, blood in the urine, or a big weight gain.  Very bad bone, joint, or muscle pain.  Any new or strange groin, hip, or thigh pain.  Chest pain.  A heartbeat that does not feel normal.  Slow heartbeat.  Change in eyesight.  Eye pain.  Mouth sores.  Trouble swallowing.  Very bad pain when swallowing.  Any bruising or bleeding.  Pain where the shot was given.  Redness or swelling where the shot is given.  This drug may cause jawbone problems. The chance may be higher the longer you take this drug. The chance may be higher if you have cancer, dental problems, dentures that do not fit well, anemia, blood clotting problems, or an infection. The chance may also be higher if you are having dental work or if you are getting chemo, some steroid drugs, or radiation. Call your doctor right away if you have jaw swelling or pain. What are some other side effects of this drug?  All drugs may cause side effects. However, many people have no side effects or only have minor side effects. Call your doctor or get medical help if any of these side effects or any other side effects bother you or do not  go away:  All products:  Dizziness.  Upset stomach or throwing up.  Irritation where the shot is given.  Feeling tired or weak.  Belly pain.  Headache.  Flu-like signs.  Loose stools (diarrhea).  Muscle or joint pain.  Back pain.  Zometa:  Not able to sleep.  Not hungry.  Hard stools (constipation).  Weight loss.  Cough.  These are not all of the side effects that may occur. If you have questions about side effects, call your doctor. Call your doctor for medical advice about side effects.  You may report side effects to your national health agency. How is this drug best taken?  Use this drug as ordered by your doctor. Read and follow the dosing on the label closely.  All products:  It is given as a shot into a vein over a period of time.  Drink lots of noncaffeine liquids unless told to drink less liquid by your doctor.  Reclast:  Acetaminophen may be given to lower fever and chills.  Drink at least 2 glasses of liquids a few hours before you get this drug. What do I do if I miss a dose?  Call the doctor to find out what to do. How do I store and/or throw out this drug?  This drug will be given to  you in a hospital or doctor's office. You will not store it at home.  Keep all drugs out of the reach of children and pets.  Check with your pharmacist about how to throw out unused drugs.  General drug facts  If your symptoms or health problems do not get better or if they become worse, call your doctor.  Do not share your drugs with others and do not take anyone else's drugs.  Keep a list of all your drugs (prescription, natural products, vitamins, OTC) with you. Give this list to your doctor.  Talk with the doctor before starting any new drug, including prescription or OTC, natural products, or vitamins.  Some drugs may have another patient information leaflet. If you have any questions about this drug, please talk with your doctor, pharmacist, or  other health care provider.  If you think there has been an overdose, call your poison control center or get medical care right away. Be ready to tell or show what was taken, how much, and when it happened.

## 2019-09-10 ENCOUNTER — Encounter: Payer: Self-pay | Admitting: Internal Medicine

## 2019-09-12 NOTE — Telephone Encounter (Signed)
I called pt and clarified

## 2019-09-19 ENCOUNTER — Other Ambulatory Visit: Payer: Medicare Other

## 2019-09-20 ENCOUNTER — Other Ambulatory Visit: Payer: Self-pay

## 2019-09-20 ENCOUNTER — Other Ambulatory Visit (INDEPENDENT_AMBULATORY_CARE_PROVIDER_SITE_OTHER): Payer: Medicare Other

## 2019-09-20 DIAGNOSIS — E213 Hyperparathyroidism, unspecified: Secondary | ICD-10-CM

## 2019-09-20 DIAGNOSIS — E559 Vitamin D deficiency, unspecified: Secondary | ICD-10-CM | POA: Diagnosis not present

## 2019-09-20 DIAGNOSIS — M810 Age-related osteoporosis without current pathological fracture: Secondary | ICD-10-CM

## 2019-09-20 LAB — VITAMIN D 25 HYDROXY (VIT D DEFICIENCY, FRACTURES): VITD: 41.42 ng/mL (ref 30.00–100.00)

## 2019-09-21 LAB — BASIC METABOLIC PANEL WITH GFR
BUN: 19 mg/dL (ref 7–25)
CO2: 24 mmol/L (ref 20–32)
Calcium: 9.5 mg/dL (ref 8.6–10.4)
Chloride: 107 mmol/L (ref 98–110)
Creat: 0.79 mg/dL (ref 0.60–0.93)
GFR, Est African American: 83 mL/min/{1.73_m2} (ref >=60)
GFR, Est Non African American: 72 mL/min/{1.73_m2} (ref >=60)
Glucose, Bld: 81 mg/dL (ref 65–99)
Potassium: 4 mmol/L (ref 3.5–5.3)
Sodium: 141 mmol/L (ref 135–146)

## 2019-10-09 ENCOUNTER — Encounter: Payer: Self-pay | Admitting: Family Medicine

## 2019-10-09 ENCOUNTER — Other Ambulatory Visit: Payer: Self-pay

## 2019-10-09 DIAGNOSIS — Z889 Allergy status to unspecified drugs, medicaments and biological substances status: Secondary | ICD-10-CM

## 2019-10-09 MED ORDER — EPINEPHRINE 0.3 MG/0.3ML IJ SOAJ: 0.3000 mg | Freq: Once | INTRAMUSCULAR | 0 refills | Status: AC

## 2019-10-24 ENCOUNTER — Ambulatory Visit: Payer: Medicare Other | Attending: Internal Medicine

## 2019-10-24 DIAGNOSIS — Z23 Encounter for immunization: Secondary | ICD-10-CM | POA: Insufficient documentation

## 2019-10-24 NOTE — Progress Notes (Signed)
   Covid-19 Vaccination Clinic  Name:  Victoria [name]    MRN: [id number] DOB: [birth year]  10/24/2019  [name] was observed post Covid-19 immunization for 15 minutes without incidence. She was provided with Vaccine Information Sheet and instruction to access the V-Safe system.   [name] was instructed to call 911 with any severe reactions post vaccine: Marland Kitchen Difficulty breathing  . Swelling of your face and throat  . A fast heartbeat  . A bad rash all over your body  . Dizziness and weakness    Immunizations Administered    Name Date Dose VIS Date Route   Pfizer COVID-19 Vaccine 10/24/2019  8:38 AM 0.3 mL 09/13/2019 Intramuscular   Manufacturer: [place]   Lot: [id number]   [place]: [phone]

## 2019-11-10 IMAGING — DX DG CHEST 2V
2 series · 2 of 2 positions shown · non-contrast
Comparison: 11/08/2013

CLINICAL DATA: Productive cough over the last 2 weeks.

EXAM:
CHEST - 2 VIEW

[chest pa]
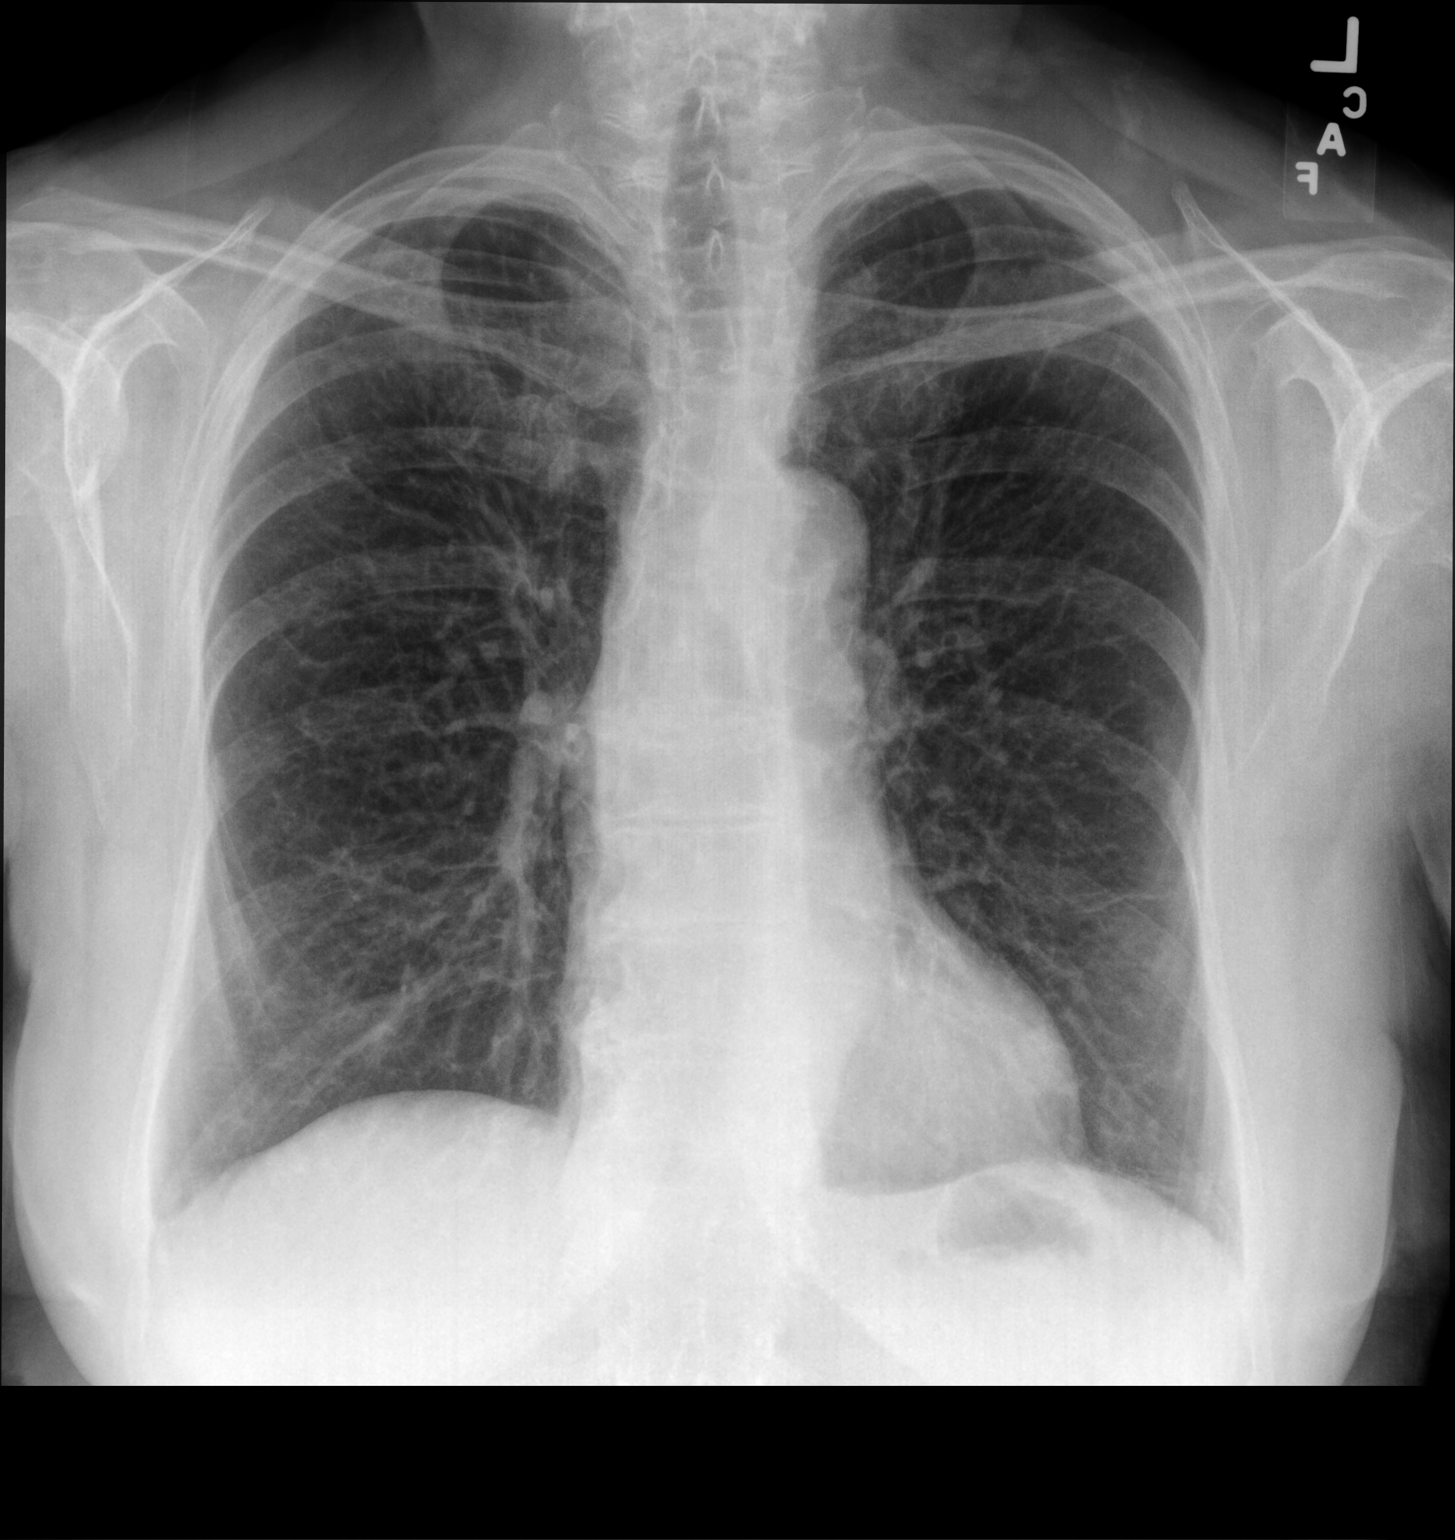

[chest lat]
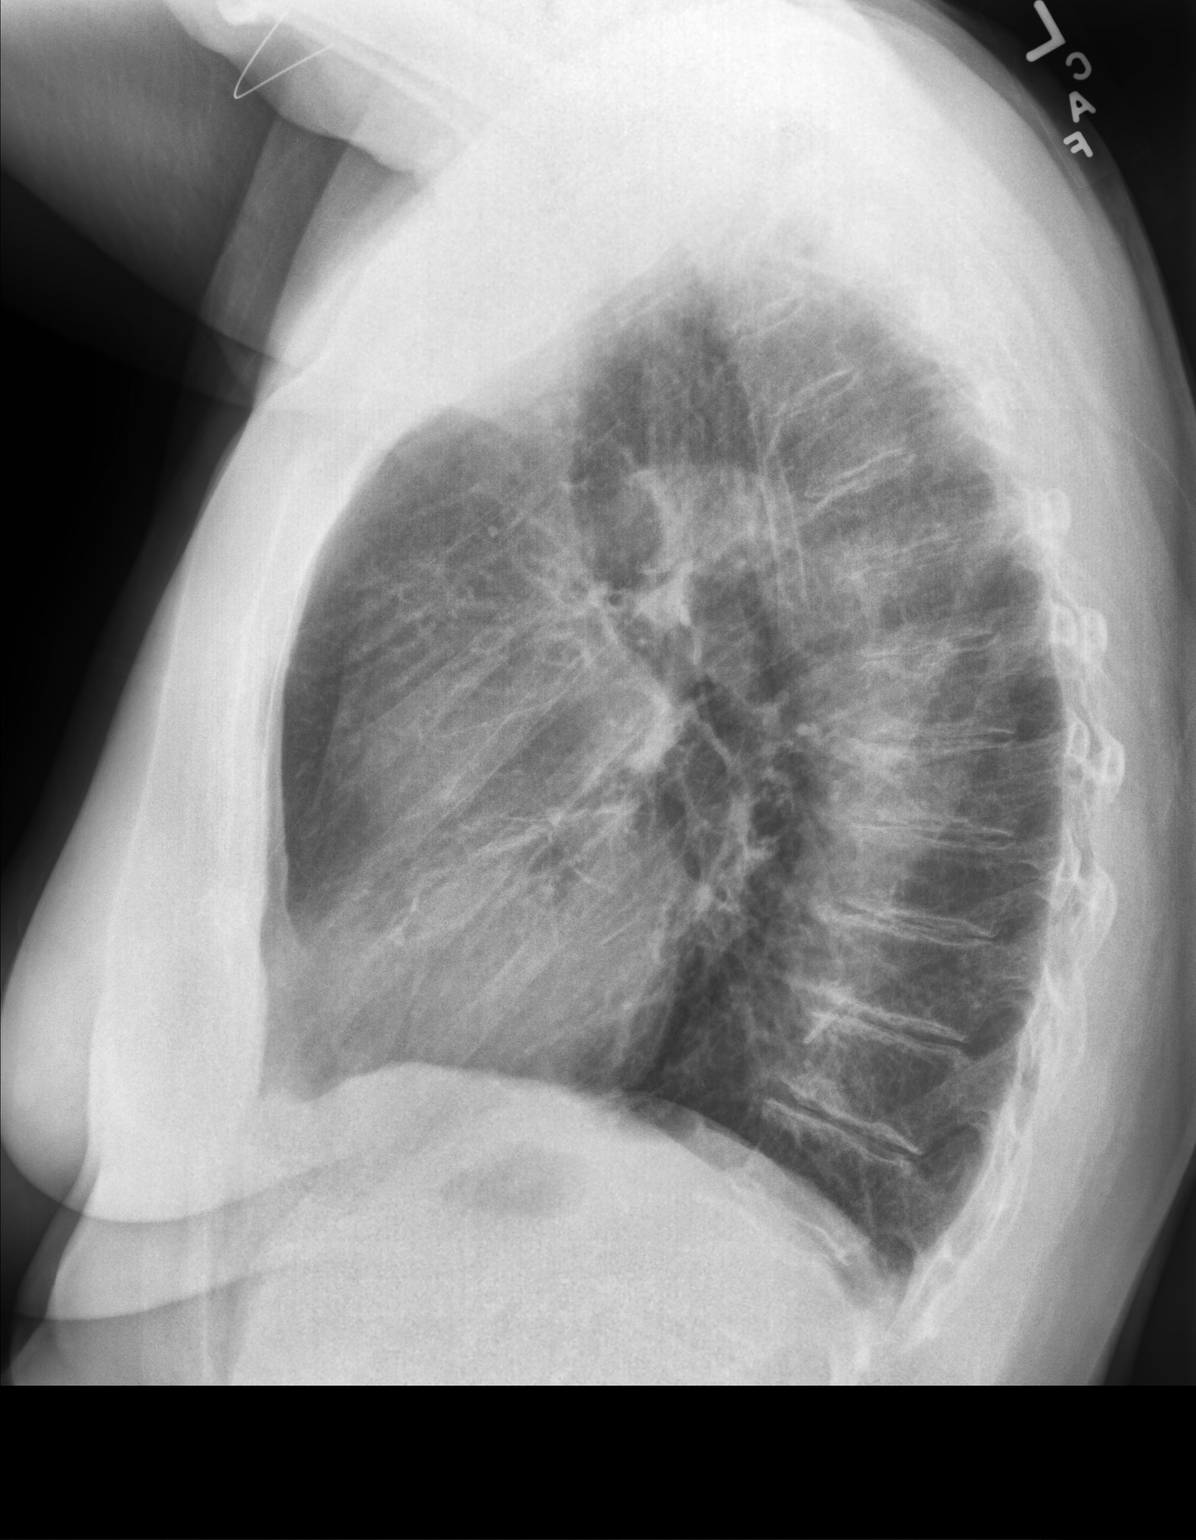

[2 of 2 positions shown; findings below may reference images not displayed]

FINDINGS: Heart size is normal. There is atherosclerosis and tortuosity of the
aorta. There may be central bronchial thickening but there is no
consolidation or collapse. Question 1.6 cm nodule in the left lower
lung projected over the left heart border. I cannot see this on the
lateral view. Because of the worrisome appearance on the frontal
radiograph, chest CT would be suggested. Alternatively, one could
perform a follow-up chest radiograph after treatment of the current
illness. I would recommend some form of follow-up however.

No effusions.  No acute bone finding.
IMPRESSION: Bronchitis pattern.  No consolidation or collapse.

Question 16 mm nodular shadow projected over the left heart border
on the frontal view. See above for follow-up recommendations.

## 2019-11-13 ENCOUNTER — Ambulatory Visit: Payer: Medicare Other | Attending: Internal Medicine

## 2019-11-13 DIAGNOSIS — Z23 Encounter for immunization: Secondary | ICD-10-CM | POA: Insufficient documentation

## 2019-11-13 NOTE — Progress Notes (Signed)
   Covid-19 Vaccination Clinic  Name:  Victoria [name]    MRN: [id number] DOB: 12/01/1940  11/13/2019  Victoria [name] was observed post Covid-19 immunization for 15 minutes without incidence. She was provided with Vaccine Information Sheet and instruction to access the V-Safe system.   Victoria [name] was instructed to call 911 with any severe reactions post vaccine: Marland Kitchen Difficulty breathing  . Swelling of your face and throat  . A fast heartbeat  . A bad rash all over your body  . Dizziness and weakness    Immunizations Administered    Name Date Dose VIS Date Route   Pfizer COVID-19 Vaccine 11/13/2019 12:38 PM 0.3 mL 09/13/2019 Intramuscular   Manufacturer: [place]   Lot: [id number]   [place]: [id number]

## 2019-12-24 ENCOUNTER — Other Ambulatory Visit: Payer: Self-pay

## 2019-12-24 ENCOUNTER — Encounter: Payer: Self-pay | Admitting: Family Medicine

## 2019-12-24 ENCOUNTER — Ambulatory Visit (INDEPENDENT_AMBULATORY_CARE_PROVIDER_SITE_OTHER): Payer: Medicare Other | Admitting: Family Medicine

## 2019-12-24 VITALS — BP 134/66 | HR 82 | Temp 97.9°F | Ht 64.0 in | Wt 154.2 lb

## 2019-12-24 DIAGNOSIS — Z8639 Personal history of other endocrine, nutritional and metabolic disease: Secondary | ICD-10-CM | POA: Diagnosis not present

## 2019-12-24 DIAGNOSIS — I10 Essential (primary) hypertension: Secondary | ICD-10-CM | POA: Diagnosis not present

## 2019-12-24 DIAGNOSIS — E039 Hypothyroidism, unspecified: Secondary | ICD-10-CM | POA: Diagnosis not present

## 2019-12-24 DIAGNOSIS — I2510 Atherosclerotic heart disease of native coronary artery without angina pectoris: Secondary | ICD-10-CM

## 2019-12-24 DIAGNOSIS — M818 Other osteoporosis without current pathological fracture: Secondary | ICD-10-CM

## 2019-12-24 DIAGNOSIS — I252 Old myocardial infarction: Secondary | ICD-10-CM

## 2019-12-24 DIAGNOSIS — Z1231 Encounter for screening mammogram for malignant neoplasm of breast: Secondary | ICD-10-CM

## 2019-12-24 DIAGNOSIS — E782 Mixed hyperlipidemia: Secondary | ICD-10-CM | POA: Diagnosis not present

## 2019-12-24 LAB — TSH: TSH: 0.78 u[IU]/mL (ref 0.35–4.50)

## 2019-12-24 LAB — CBC WITH DIFFERENTIAL/PLATELET
Basophils Absolute: 0 10*3/uL (ref 0.0–0.1)
Basophils Relative: 0.7 % (ref 0.0–3.0)
Eosinophils Absolute: 0.4 10*3/uL (ref 0.0–0.7)
Eosinophils Relative: 7.2 % — ABNORMAL HIGH (ref 0.0–5.0)
HCT: 39.2 % (ref 36.0–46.0)
Hemoglobin: 13.3 g/dL (ref 12.0–15.0)
Lymphocytes Relative: 39.2 % (ref 12.0–46.0)
Lymphs Abs: 2.3 10*3/uL (ref 0.7–4.0)
MCHC: 34 g/dL (ref 30.0–36.0)
MCV: 92.6 fl (ref 78.0–100.0)
Monocytes Absolute: 0.3 10*3/uL (ref 0.1–1.0)
Monocytes Relative: 5.9 % (ref 3.0–12.0)
Neutro Abs: 2.7 10*3/uL (ref 1.4–7.7)
Neutrophils Relative %: 47 % (ref 43.0–77.0)
Platelets: 248 10*3/uL (ref 150.0–400.0)
RBC: 4.23 Mil/uL (ref 3.87–5.11)
RDW: 13.1 % (ref 11.5–15.5)
WBC: 5.8 10*3/uL (ref 4.0–10.5)

## 2019-12-24 LAB — LIPID PANEL
Cholesterol: 141 mg/dL (ref 0–200)
HDL: 43.9 mg/dL (ref >39.00)
LDL Cholesterol: 78 mg/dL (ref 0–99)
NonHDL: 96.92
Total CHOL/HDL Ratio: 3
Triglycerides: 94 mg/dL (ref 0.0–149.0)
VLDL: 18.8 mg/dL (ref 0.0–40.0)

## 2019-12-24 LAB — COMPREHENSIVE METABOLIC PANEL
ALT: 19 U/L (ref 0–35)
AST: 17 U/L (ref 0–37)
Albumin: 4.2 g/dL (ref 3.5–5.2)
Alkaline Phosphatase: 83 U/L (ref 39–117)
BUN: 20 mg/dL (ref 6–23)
CO2: 26 mEq/L (ref 19–32)
Calcium: 9.5 mg/dL (ref 8.4–10.5)
Chloride: 106 mEq/L (ref 96–112)
Creatinine, Ser: 0.84 mg/dL (ref 0.40–1.20)
GFR: 65.45 mL/min (ref >60.00)
Glucose, Bld: 89 mg/dL (ref 70–99)
Potassium: 3.9 mEq/L (ref 3.5–5.1)
Sodium: 141 mEq/L (ref 135–145)
Total Bilirubin: 0.6 mg/dL (ref 0.2–1.2)
Total Protein: 7.1 g/dL (ref 6.0–8.3)

## 2019-12-24 NOTE — Progress Notes (Signed)
Subjective  CC:  Chief Complaint  Patient presents with  . Transitions Of Care    TOC from Dr. Juleen China  . Hypothyroidism    taking Synthroid 75 mcg. no side effects  . Hypertension    more unsteady on feet. Denies HA or visual changes  . Hyperlipidemia    takes atorvastatin 3 times a week    HPI: Victoria [name] is a 79 y.o. female who presents to [facility] today to establish care with me as a new patient.  I spent 30 minutes reviewing her records. Reviewed specialists notes/procedures/lab work and prior PCP notes.PL updated accordingly She has the following concerns or needs:  Pleasant married 79 yo retired from [employer] and former Education officer, museum originally from [place] with 2 grown sons and 3 grandchildren who feels well today. She and her husband are considering downsizing and moving to [place] to ease the burden of home ownership. She has multiple medical problems and is followed by endocrine and cardiology. Most are stable  CAD w/ h/o nonstemi: no chf. Has atypical chest pain at times. On statin   HTN treated with amlodipine.   H/o partial parathyroidectomy due to primary Hyperparathyroidism with elevated calcium levels. Now improved.   Osteoporosis: reviewed endocrines recent eval and counseling about medications.   Back pain w/ h/o lumbar surgery: now much improved.   HM: hasn't had mammogram in years. imms up to date.   Assessment  1. Mixed hyperlipidemia   2. Age-related osteoporosis without fracture   3. History of primary hyperparathyroidism   4. Acquired hypothyroidism   5. Coronary artery disease involving native coronary artery of native heart, angina presence unspecified   6. Essential hypertension, benign   7. History of non-ST elevation myocardial infarction (NSTEMI)   8. Encounter for screening mammogram for breast cancer      Plan   Chronic medical problems are controlled. Will check labwork today to confirm LDL is at  goal, nl lfts andrenal fxn and lytes; check thyroid and adjust dose if needed. Clinically euthyroid.   No med changes made today  Counseling on breast cancer screening done. Pt will schedule mammogram at [facility].   Follow up:  6 months for recheck.  Orders Placed This Encounter  Procedures  . MM DIGITAL SCREENING BILATERAL  . CBC with Differential/Platelet  . Comprehensive metabolic panel  . Lipid panel  . TSH   No orders of the defined types were placed in this encounter.    Depression screen [facility] 2/9 03/01/2018 07/05/2017 05/17/2016 05/17/2016 01/23/2014  Decreased Interest 0 0 (No Data) 0 0  Down, Depressed, Hopeless 0 0 - 0 1  PHQ - 2 Score 0 0 - 0 1    We updated and reviewed the patient's past history in detail and it is documented below.  Patient Active Problem List   Diagnosis Date Noted  . History of primary hyperparathyroidism 06/27/2018    Priority: High    S/p partial parathyroidectomy; followed by endocrine   . Chronic right-sided low back pain with right-sided sciatica 08/15/2017    Priority: High    Followed by [facility] and spine per 03-08-18 Right L3-4 foraminotomy  Not surgical candidate presently (Jan 2020); planning to work on [employer]   . Essential hypertension, benign 05/14/2011    Priority: High  . CAD (coronary artery disease) 01/19/2009    Priority: High  . Acquired hypothyroidism 12/04/2008    Priority: High  . Mixed hyperlipidemia  12/04/2008    Priority: High  . History of non-ST elevation myocardial infarction (NSTEMI) 12/04/2008    Priority: High    2009    . Age-related osteoporosis without fracture 12/04/2008    Priority: Medium    Has used fosamax years ago. No hesitant to restart meds due to possible side effects. Dr. Cruzita Lederer counseling and monitoring dexa scans  DEXA 04/2009: -2.3 spine, -2.7 L fem, -2.6 R fem DEXA 12/2011: -2.2 spine, -2.7 L fem, -2.6 R fem DEXA 01/2014: -2.8 DEXA 08/2017: -2.7 spine, -3.1 R fem, -3.1 L  fem Results: Dexa 2020 Lumbar spine L1-L4 Femoral neck (FN) 33% distal radius  T-score -2.7 RFN: -3.0 LFN: -3.1 -2.9  Change in BMD from previous DXA test (%)         . Hx of laminectomy L5-1 10/31/2018    Priority: Low  . Vitamin D deficiency 08/02/2010    Priority: Low  . Allergic rhinitis 07/30/2010    Priority: Low  . Multiple drug allergies 06/04/2019   Health Maintenance  Topic Date Due  . MAMMOGRAM  12/02/2012  . DEXA SCAN  08/11/2021  . TETANUS/TDAP  01/08/2023  . PNA vac Low Risk Adult  Completed  . INFLUENZA VACCINE  Discontinued   Immunization History  Administered Date(s) Administered  . Influenza Split 08/23/2012  . Influenza, High Dose Seasonal PF 08/02/2017, 07/26/2018, 07/16/2019  . Influenza,inj,Quad PF,6+ Mos 09/30/2014  . Influenza-Unspecified 10/07/2013  . PFIZER SARS-COV-2 Vaccination 10/24/2019, 11/13/2019  . Pneumococcal Conjugate-13 10/04/2007, 01/09/2015  . Pneumococcal-Unspecified 09/13/2006  . Tdap 10/03/2004, 01/07/2013  . Zoster 09/02/2009, 10/05/2009  . Zoster Recombinat (Shingrix) 03/29/2018, 06/25/2018   Current Meds  Medication Sig  . amLODipine (NORVASC) 5 MG tablet Take 1 tablet (5 mg total) by mouth daily.  Marland Kitchen aspirin 81 MG tablet Take 81 mg by mouth daily.    Marland Kitchen atorvastatin (LIPITOR) 10 MG tablet Take 1 tablet by mouth 3 times a week.  . Cholecalciferol (VITAMIN D) 2000 UNITS CAPS Take 1 capsule (2,000 Units total) by mouth daily.  . clobetasol (TEMOVATE) 0.05 % external solution Apply 1 application topically daily as needed. to scalp.  . Coenzyme Q10 (CO Q-10) 300 MG CAPS Take 300 mg by mouth daily.   . Desoximetasone (TOPICORT) 0.25 % ointment Apply 1 application topically 2 (two) times daily as needed (itching). Apply to hands.   Marland Kitchen EPINEPHrine 0.3 mg/0.3 mL IJ SOAJ injection Inject 0.3 mg into the muscle Once PRN.  Marland Kitchen ezetimibe (ZETIA) 10 MG tablet Take 1 tablet (10 mg total) by mouth daily.  . Multiple Vitamins-Minerals (EYE  VITAMINS) TABS Take 1 tablet by mouth 2 (two) times daily.   . nitroGLYCERIN (NITROSTAT) 0.4 MG SL tablet PLACE 1 TABLET UNDER THE                 TONGUE AND ALLOW TO                      DISSOLVE EVERY 5 MINUTES AS              NEEDED FOR CHEST PAIN AS DIRECTED BY YOUR DOCTOR  . SYNTHROID 75 MCG tablet TAKE 1 TABLET DAILY  . [DISCONTINUED] levothyroxine (SYNTHROID, LEVOTHROID) 88 MCG tablet Take 88 mcg by mouth daily before breakfast.    Allergies: Patient is allergic to shellfish allergy; codeine; imdur [isosorbide nitrate]; lanolin; latex; pollen extract; statins; cephalexin; ciprofloxacin; clindamycin/lincomycin; erythromycin; penicillins; propylene glycol; sulfa antibiotics; and tetracycline. Past Medical History Patient  has  a past medical history of Acute lumbar radiculopathy (04/2012), Anemia, Anxiety, Atopic eczema, CAD (coronary artery disease), Complication of anesthesia, Depression, Dyspnea, Essential hypertension, benign, Hyperlipidemia, statin intolerant, Hypothyroidism, Myocardial infarction ([place]) (2009), NSTEMI (non-ST elevated myocardial infarction) ([place]) (2009), Osteoporosis, s/p bisphophonates x 7 years, stopped in 2011, Pneumonia, PONV (postoperative nausea and vomiting), and Sacrum and coccyx fracture ([place]). Past Surgical History Patient  has a past surgical history that includes Appendectomy; Tonsillectomy; Carpal tunnel release (Bilateral); Hip surgery (1980s); Laparoscopic hysterectomy; Cardiac catheterization (2009); Lumbar laminectomy/decompression microdiscectomy (04/17/2012); left heart catheterization with coronary angiogram (N/A, 11/18/2013); Spine surgery (03/2018); Left interior parathyroiectomy; Parathyroidectomy (Left, 08/14/2018); and cateract syrgery (Bilateral, 10/2018 and 11/2018). Family History: Patient family history includes Colon cancer (age of onset: 34) in her mother; Stroke in her unknown relative; Stroke (age of onset: 66) in her father. Social History:    Patient  reports that she has never smoked. She has never used smokeless tobacco. She reports current alcohol use of about 1.0 standard drinks of alcohol per week. She reports that she does not use drugs.  Review of Systems: Constitutional: negative for fever or malaise Ophthalmic: negative for photophobia, double vision or loss of vision Cardiovascular: negative for chest pain, dyspnea on exertion, or new LE swelling Respiratory: negative for SOB or persistent cough Gastrointestinal: negative for abdominal pain, change in bowel habits or melena Genitourinary: negative for dysuria or gross hematuria Musculoskeletal: negative for new gait disturbance or muscular weakness Integumentary: negative for new or persistent rashes Neurological: negative for TIA or stroke symptoms Psychiatric: negative for SI or delusions Allergic/Immunologic: negative for hives  Patient Care Team    Relationship Specialty Notifications Start End  Leamon Arnt, MD PCP - General Family Medicine  12/24/19   Erline Levine, MD  Neurosurgery  01/07/13   Ronald Lobo, MD  Gastroenterology  01/07/13   Armandina Gemma, MD Consulting Physician General Surgery  08/06/18   Philemon Kingdom, MD Consulting Physician Endocrinology  12/24/19   [name], MD Consulting Physician Cardiology  12/24/19     Objective  Vitals: BP 134/66 (BP Location: Right Arm, Patient Position: Sitting, Cuff Size: Normal)   Pulse 82   Temp 97.9 F (36.6 C) (Temporal)   Ht 5\' 4"  (1.626 m)   Wt 154 lb 3.2 oz (69.9 kg)   SpO2 97%   BMI 26.47 kg/m  General:  Well developed, well nourished, no acute distress  Psych:  Alert and oriented,normal mood and affect HEENT:  Normocephalic, atraumatic, non-icteric sclera, supple neck without adenopathy, mass or thyromegaly Cardiovascular:  RRR without gallop, rub or murmur Respiratory:  Good breath sounds bilaterally, CTAB with normal respiratory effort Ext: no edema Skin:  Warm, no rashes or  suspicious lesions noted Neurologic:    Mental status is normal. Gross motor and sensory exams are normal. Normal gait   Commons side effects, risks, benefits, and alternatives for medications and treatment plan prescribed today were discussed, and the patient expressed understanding of the given instructions. Patient is instructed to call or message via MyChart if he/she has any questions or concerns regarding our treatment plan. No barriers to understanding were identified. We discussed Red Flag symptoms and signs in detail. Patient expressed understanding regarding what to do in case of urgent or emergency type symptoms.   Medication list was reconciled, printed and provided to the patient in AVS. Patient instructions and summary information was reviewed with the patient as documented in the AVS. This note was prepared with assistance of Dragon voice recognition  software. Occasional wrong-word or sound-a-like substitutions may have occurred due to the inherent limitations of voice recognition software  This visit occurred during the SARS-CoV-2 public health emergency.  Safety protocols were in place, including screening questions prior to the visit, additional usage of staff PPE, and extensive cleaning of exam room while observing appropriate contact time as indicated for disinfecting solutions.

## 2019-12-24 NOTE — Patient Instructions (Signed)
Please return in 6 months for hypertension follow up and recheck.  I will release your lab results to you on your MyChart account with further instructions. Please reply with any questions.   I have ordered a mammogram and/or bone density for you as we discussed today: [x]   Mammogram  []   Bone Density  Please call the office checked below to schedule your appointment: Your appointment will at the following location  []   [facility]      [place], [place]         [x]   [facility]  [place], [place]  Make sure to wear two peace clothing  No lotions powders or deodorants the day of the appointment Make sure to bring picture ID and insurance card.  Bring list of medications you are currently taking including any supplements.   It was a pleasure meeting you today! Thank you for choosing [place] to meet your healthcare needs! I truly look forward to working with you. If you have any questions or concerns, please send me a message via Mychart or call the office at [phone].

## 2020-01-03 LAB — HM MAMMOGRAPHY

## 2020-01-09 ENCOUNTER — Encounter: Payer: Self-pay | Admitting: Family Medicine

## 2020-01-09 ENCOUNTER — Telehealth: Payer: Self-pay | Admitting: Cardiovascular Disease

## 2020-01-09 ENCOUNTER — Encounter: Payer: Medicare Other | Admitting: Family Medicine

## 2020-01-09 NOTE — Telephone Encounter (Signed)
Called the patient to discuss the visitor's policy. She states she really wants her husband at the visit because he is having issues and she wants him to see Dr. Burt Knack.  Rescheduled both the patient and her husband to 4/14 so they both have appointments. She was grateful for assistance.

## 2020-01-09 NOTE — Telephone Encounter (Signed)
New message:    Patient calling stating that her husband will be with her for appt on Monday to help her remember questions to ask.

## 2020-01-13 ENCOUNTER — Ambulatory Visit: Payer: Medicare Other | Admitting: Cardiovascular Disease

## 2020-01-14 ENCOUNTER — Encounter: Payer: Self-pay | Admitting: Family Medicine

## 2020-01-15 ENCOUNTER — Encounter: Payer: Self-pay | Admitting: Cardiovascular Disease

## 2020-01-15 ENCOUNTER — Ambulatory Visit: Payer: Medicare Other | Admitting: Cardiovascular Disease

## 2020-01-15 ENCOUNTER — Other Ambulatory Visit: Payer: Self-pay

## 2020-01-15 ENCOUNTER — Other Ambulatory Visit: Payer: Self-pay | Admitting: *Deleted

## 2020-01-15 VITALS — BP 124/76 | HR 83 | Ht 63.5 in | Wt 153.4 lb

## 2020-01-15 DIAGNOSIS — E782 Mixed hyperlipidemia: Secondary | ICD-10-CM

## 2020-01-15 DIAGNOSIS — I10 Essential (primary) hypertension: Secondary | ICD-10-CM

## 2020-01-15 DIAGNOSIS — I25119 Atherosclerotic heart disease of native coronary artery with unspecified angina pectoris: Secondary | ICD-10-CM

## 2020-01-15 MED ORDER — EZETIMIBE 10 MG PO TABS: 10.0000 mg | ORAL_TABLET | Freq: Every day | ORAL | 3 refills | Status: DC

## 2020-01-15 MED ORDER — AMLODIPINE BESYLATE 5 MG PO TABS: 5.0000 mg | ORAL_TABLET | Freq: Every day | ORAL | 3 refills | Status: DC

## 2020-01-15 MED ORDER — ATORVASTATIN CALCIUM 10 MG PO TABS: ORAL_TABLET | ORAL | 3 refills | Status: DC

## 2020-01-15 MED ORDER — ATORVASTATIN CALCIUM 10 MG PO TABS: 10.0000 mg | ORAL_TABLET | ORAL | 3 refills | Status: DC

## 2020-01-15 NOTE — Addendum Note (Signed)
Addended by: Harland German A on: 01/15/2020 01:22 PM   Modules accepted: Orders

## 2020-01-15 NOTE — Patient Instructions (Signed)
Medication Instructions:  Your provider recommends that you continue on your current medications as directed. Please refer to the Current Medication list given to you today.   *If you need a refill on your cardiac medications before your next appointment, please call your pharmacy*  Follow-Up: At [facility], you and your health needs are our priority.  As part of our continuing mission to provide you with exceptional heart care, we have created designated Provider Care Teams.  These Care Teams include your primary Cardiologist (physician) and Advanced Practice Providers (APPs -  Physician Assistants and Nurse Practitioners) who all work together to provide you with the care you need, when you need it. Your next appointment:   6 month(s) The format for your next appointment:   In Person Provider:   You may see Dr. Burt Knack or one of the following Advanced Practice Providers on your designated Care Team:    Richardson Dopp, PA-C  [place], [place]  Daune Perch, [place]

## 2020-01-15 NOTE — Progress Notes (Signed)
Cardiology Office Note:    Date:  01/15/2020   ID:  Victoria [name], DOB January 22, 1941, MRN [id number]  PCP:  Leamon Arnt, MD  Cardiologist:  No primary care provider on file.  Electrophysiologist:  None   Referring MD: Leamon Arnt, MD   Chief Complaint  Patient presents with  . Chest Pain    History of Present Illness:    Victoria [name] is a 79 y.o. female with a hx of coronary artery disease who initially presented in 2009 with non-ST elevation MI treated with balloon angioplasty of a small circumflex branch.  In 2016 she developed progressive angina and underwent repeat catheterization with demonstration of moderate diffuse mid LAD stenosis and borderline FFR analysis.  Medical therapy was recommended.  She had a nuclear scan in May 2019 demonstrating no ischemia and normal LVEF of 75%. She has had chronic angina over the years.   She is here with her husband today. She's doing really well, having less angina than in the past. No shortness of breath. They have been under increased stress because they are transitioning from their home to [facility] in the near future. No edema, orthopnea, PND, or heart palpitations.   Past Medical History:  Diagnosis Date  . Acute lumbar radiculopathy 04/2012  . Anemia    history of  . Anxiety   . Atopic eczema   . CAD (coronary artery disease)   . Complication of anesthesia   . Depression   . Dyspnea    with activity  . Essential hypertension, benign   . Hyperlipidemia, statin intolerant   . Hypothyroidism   . Myocardial infarction ([place]) 2009  . NSTEMI (non-ST elevated myocardial infarction) ([place]) 2009  . Osteoporosis, s/p bisphophonates x 7 years, stopped in 2011   . Pneumonia   . PONV (postoperative nausea and vomiting)   . Sacrum and coccyx fracture [facility])     Past Surgical History:  Procedure Laterality Date  . APPENDECTOMY    . CARDIAC CATHETERIZATION  2009  . CARPAL TUNNEL RELEASE Bilateral   . cateract  syrgery Bilateral 10/2018 and 11/2018  . HIP SURGERY  1980s   due to infection   . LAPAROSCOPIC HYSTERECTOMY    . LEFT HEART CATHETERIZATION WITH CORONARY ANGIOGRAM N/A 11/18/2013   Procedure: LEFT HEART CATHETERIZATION WITH CORONARY ANGIOGRAM;  Surgeon: Blane Ohara, MD;  [facility] C[facility] LAB;  Service: Cardiovascular;  Laterality: N/A;  . Left interior parathyroiectomy     Dr. Harlow Asa 08-14-18  . LUMBAR LAMINECTOMY/DECOMPRESSION MICRODISCECTOMY  04/17/2012   Procedure: LUMBAR LAMINECTOMY/DECOMPRESSION MICRODISCECTOMY 1 LEVEL;  Surgeon: Erline Levine, MD;  Location: [facility] NEURO ORS;  Service: Neurosurgery;  Laterality: Left;  Left Lumbar five-Sacral one Microdiskectomy  . PARATHYROIDECTOMY Left 08/14/2018   Procedure: LEFT INFERIOR PARATHYROIDECTOMY;  Surgeon: Armandina Gemma, MD;  Location: [facility] ORS;  Service: General;  Laterality: Left;  . SPINE SURGERY  03/2018   Dr Vertell Limber  . TONSILLECTOMY      Current Medications: Current Meds  Medication Sig  . amLODipine (NORVASC) 5 MG tablet Take 1 tablet (5 mg total) by mouth daily.  Marland Kitchen aspirin 81 MG tablet Take 81 mg by mouth daily.    Marland Kitchen atorvastatin (LIPITOR) 10 MG tablet Take 1 tablet by mouth 3 times a week.  . Cholecalciferol (VITAMIN D) 2000 UNITS CAPS Take 1 capsule (2,000 Units total) by mouth daily.  . clobetasol (TEMOVATE) 0.05 % external solution Apply 1 application topically daily as needed. to scalp.  . Coenzyme  Q10 (CO Q-10) 300 MG CAPS Take 300 mg by mouth daily.   . Desoximetasone (TOPICORT) 0.25 % ointment Apply 1 application topically 2 (two) times daily as needed (itching). Apply to hands.   Marland Kitchen EPINEPHrine 0.3 mg/0.3 mL IJ SOAJ injection Inject 0.3 mg into the muscle Once PRN.  Marland Kitchen ezetimibe (ZETIA) 10 MG tablet Take 1 tablet (10 mg total) by mouth daily.  Marland Kitchen loratadine (CLARITIN) 10 MG tablet Take 10 mg by mouth daily as needed for allergies.  . Multiple Vitamins-Minerals (EYE VITAMINS) TABS Take 1 tablet by mouth 2 (two) times daily.    . nitroGLYCERIN (NITROSTAT) 0.4 MG SL tablet PLACE 1 TABLET UNDER THE                 TONGUE AND ALLOW TO                      DISSOLVE EVERY 5 MINUTES AS              NEEDED FOR CHEST PAIN AS DIRECTED BY YOUR DOCTOR  . SYNTHROID 75 MCG tablet TAKE 1 TABLET DAILY     Allergies:   Shellfish allergy, Codeine, Imdur [isosorbide nitrate], Lanolin, Latex, Pollen extract, Statins, Cephalexin, Ciprofloxacin, Clindamycin/lincomycin, Erythromycin, Penicillins, Propylene glycol, Sulfa antibiotics, and Tetracycline   Social History   Socioeconomic History  . Marital status: Married    Spouse name: Not on file  . Number of children: 2  . Years of education: Not on file  . Highest education level: Not on file  Occupational History  . Occupation: retired  Tobacco Use  . Smoking status: Never Smoker  . Smokeless tobacco: Never Used  Substance and Sexual Activity  . Alcohol use: Yes    Alcohol/week: 1.0 standard drinks    Types: 1 Glasses of wine per week    Comment: occasional glass of wine  . Drug use: No  . Sexual activity: Not Currently  Other Topics Concern  . Not on file  Social History Narrative  . Not on file   Social Determinants of Health   Financial Resource Strain:   . Difficulty of Paying Living Expenses:   Food Insecurity:   . Worried About Charity fundraiser in the Last Year:   . Arboriculturist in the Last Year:   Transportation Needs:   . Film/video editor (Medical):   Marland Kitchen Lack of Transportation (Non-Medical):   Physical Activity:   . Days of Exercise per Week:   . Minutes of Exercise per Session:   Stress:   . Feeling of Stress :   Social Connections:   . Frequency of Communication with Friends and Family:   . Frequency of Social Gatherings with Friends and Family:   . Attends Religious Services:   . Active Member of Clubs or Organizations:   . Attends Archivist Meetings:   Marland Kitchen Marital Status:      Family History: The patient's family history  includes Colon cancer (age of onset: 89) in her mother; Stroke in an other family member; Stroke (age of onset: 87) in her father.  ROS:   Please see the history of present illness.    All other systems reviewed and are negative.  EKGs/Labs/Other Studies Reviewed:    The following studies were reviewed today: Myoview 02/20/2018: Study Highlights    Nuclear stress EF: 75%.  There was no ST segment deviation noted during stress.  The left ventricular ejection fraction is hyperdynamic (>65%).  Poor quality study with diffusely low counts in apex and inferior wall on all images No evidence of ischemia EF 75%     EKG:  EKG is not ordered today.    Recent Labs: 04/23/2019: Magnesium 2.1 12/24/2019: ALT 19; BUN 20; Creatinine, Ser 0.84; Hemoglobin 13.3; Platelets 248.0; Potassium 3.9; Sodium 141; TSH 0.78  Recent Lipid Panel    Component Value Date/Time   CHOL 141 12/24/2019 0856   CHOL 144 04/23/2019 0916   TRIG 94.0 12/24/2019 0856   HDL 43.90 12/24/2019 0856   HDL 50 04/23/2019 0916   CHOLHDL 3 12/24/2019 0856   VLDL 18.8 12/24/2019 0856   LDLCALC 78 12/24/2019 0856   LDLCALC 79 04/23/2019 0916    Physical Exam:    VS:  BP 124/76   Pulse 83   Ht 5' 3.5" (1.613 m)   Wt 153 lb 6.4 oz (69.6 kg)   SpO2 98%   BMI 26.75 kg/m     Wt Readings from Last 3 Encounters:  01/15/20 153 lb 6.4 oz (69.6 kg)  12/24/19 154 lb 3.2 oz (69.9 kg)  07/08/19 154 lb 3.2 oz (69.9 kg)     GEN:  Well nourished, well developed in no acute distress HEENT: Normal NECK: No JVD; No carotid bruits LYMPHATICS: No lymphadenopathy CARDIAC: RRR, no murmurs, rubs, gallops RESPIRATORY:  Clear to auscultation without rales, wheezing or rhonchi  ABDOMEN: Soft, non-tender, non-distended MUSCULOSKELETAL:  No edema; No deformity  SKIN: Warm and dry NEUROLOGIC:  Alert and oriented x 3 PSYCHIATRIC:  Normal affect   ASSESSMENT:    1. Coronary artery disease involving native coronary artery of  native heart with angina pectoris ([place])   2. Essential hypertension   3. Mixed hyperlipidemia    PLAN:    In order of problems listed above:  1. The patient is currently doing well with fewer episodes of angina.  She has no exertional symptoms.  She will continue on aspirin, atorvastatin, and amlodipine.  No changes are recommended today.  Her last Myoview scan is reviewed as outlined above.  This was performed in 2019 and demonstrated no ischemia. 2. Blood pressure is well controlled on amlodipine. 3. Recent lipids are reviewed.  LDL cholesterol 78 mg/dL.  She has been intolerant to multiple agents with myalgias.  She is currently on a combination of atorvastatin 10 mg 3 days/week and ezetimibe 10 mg daily.  I recommended continuing her current program.  Medication Adjustments/Labs and Tests Ordered: Current medicines are reviewed at length with the patient today.  Concerns regarding medicines are outlined above.  No orders of the defined types were placed in this encounter.  No orders of the defined types were placed in this encounter.   There are no Patient Instructions on file for this visit.   Signed, [name], MD  01/15/2020 9:42 AM    [place]

## 2020-01-16 NOTE — Telephone Encounter (Signed)
Patient called in and stated that [facility] received her Husband paperwork but they only received one of the papers of her. Patient would like a call back.

## 2020-02-22 ENCOUNTER — Ambulatory Visit (INDEPENDENT_AMBULATORY_CARE_PROVIDER_SITE_OTHER): Payer: Medicare Other | Admitting: Family Medicine

## 2020-02-22 ENCOUNTER — Encounter: Payer: Self-pay | Admitting: Family Medicine

## 2020-02-22 ENCOUNTER — Other Ambulatory Visit: Payer: Self-pay

## 2020-02-22 VITALS — Ht 63.5 in | Wt 150.0 lb

## 2020-02-22 DIAGNOSIS — R300 Dysuria: Secondary | ICD-10-CM

## 2020-02-22 MED ORDER — NITROFURANTOIN MONOHYD MACRO 100 MG PO CAPS: 100.0000 mg | ORAL_CAPSULE | Freq: Two times a day (BID) | ORAL | 0 refills | Status: DC

## 2020-02-22 NOTE — Progress Notes (Signed)
Virtual Visit via Video   I connected with patient on 02/22/20 at 10:00 AM EDT by a video enabled telemedicine application and verified that I am speaking with the correct person using two identifiers.  Location patient: Home Location provider: [facility], Office Persons participating in the virtual visit: Patient, Provider, [place] Pedro Earls)  I discussed the limitations of evaluation and management by telemedicine and the availability of in person appointments. The patient expressed understanding and agreed to proceed.  Interactive audio and video telecommunications were attempted between this provider and patient, however failed, due to patient having technical difficulties OR patient did not have access to video capability.  We continued and completed visit with audio only.   Subjective:   HPI:   Dysuria- 'I believe I have a urinary tract infxn'.  + burning, frequency.  Started on Wednesday.  Reports that she can usually take 'pure cranberry juice' and this solves the problem but not this time.  No fever.  No nausea or vomiting.  No hematuria, 'but I can't tell if it's the cranberry juice'.  + lower back pain.  Multiple medication allergies.  No hx of recent UTI.  ROS:   See pertinent positives and negatives per HPI.  Patient Active Problem List   Diagnosis Date Noted  . Multiple drug allergies 06/04/2019  . Hx of laminectomy L5-1 10/31/2018  . History of primary hyperparathyroidism 06/27/2018  . Chronic right-sided low back pain with right-sided sciatica 08/15/2017  . Essential hypertension, benign 05/14/2011  . Vitamin D deficiency 08/02/2010  . Allergic rhinitis 07/30/2010  . CAD (coronary artery disease) 01/19/2009  . Acquired hypothyroidism 12/04/2008  . Mixed hyperlipidemia 12/04/2008  . History of non-ST elevation myocardial infarction (NSTEMI) 12/04/2008  . Age-related osteoporosis without fracture 12/04/2008    Social History   Tobacco Use  . Smoking  status: Never Smoker  . Smokeless tobacco: Never Used  Substance Use Topics  . Alcohol use: Yes    Alcohol/week: 1.0 standard drinks    Types: 1 Glasses of wine per week    Comment: occasional glass of wine    Current Outpatient Medications:  .  amLODipine (NORVASC) 5 MG tablet, Take 1 tablet (5 mg total) by mouth daily., Disp: 90 tablet, Rfl: 3 .  aspirin 81 MG tablet, Take 81 mg by mouth daily.  , Disp: , Rfl:  .  atorvastatin (LIPITOR) 10 MG tablet, Take 1 tablet (10 mg total) by mouth 3 (three) times a week., Disp: 36 tablet, Rfl: 3 .  Cholecalciferol (VITAMIN D) 2000 UNITS CAPS, Take 1 capsule (2,000 Units total) by mouth daily., Disp: 30 capsule, Rfl:  .  clobetasol (TEMOVATE) 0.05 % external solution, Apply 1 application topically daily as needed. to scalp., Disp: 50 mL, Rfl: 1 .  Coenzyme Q10 (CO Q-10) 300 MG CAPS, Take 300 mg by mouth daily. , Disp: , Rfl:  .  Desoximetasone (TOPICORT) 0.25 % ointment, Apply 1 application topically 2 (two) times daily as needed (itching). Apply to hands. , Disp: , Rfl:  .  EPINEPHrine 0.3 mg/0.3 mL IJ SOAJ injection, Inject 0.3 mg into the muscle Once PRN., Disp: , Rfl:  .  ezetimibe (ZETIA) 10 MG tablet, Take 1 tablet (10 mg total) by mouth daily., Disp: 90 tablet, Rfl: 3 .  loratadine (CLARITIN) 10 MG tablet, Take 10 mg by mouth daily as needed for allergies., Disp: , Rfl:  .  Multiple Vitamins-Minerals (EYE VITAMINS) TABS, Take 1 tablet by mouth 2 (two) times daily. ,  Disp: , Rfl:  .  nitroGLYCERIN (NITROSTAT) 0.4 MG SL tablet, PLACE 1 TABLET UNDER THE                 TONGUE AND ALLOW TO                      DISSOLVE EVERY 5 MINUTES AS              NEEDED FOR CHEST PAIN AS DIRECTED BY YOUR DOCTOR, Disp: 25 tablet, Rfl: 3 .  SYNTHROID 75 MCG tablet, TAKE 1 TABLET DAILY, Disp: 90 tablet, Rfl: 3  Allergies  Allergen Reactions  . Shellfish Allergy Hives and Shortness Of Breath  . Codeine Other (See Comments)    Headache  . Imdur [Isosorbide  Nitrate] Other (See Comments)    Confusion  . Lanolin Itching  . Latex Other (See Comments)    Unknown, allergy testing result  . Pollen Extract   . Statins     Simvastatin, Lipitor (both caused muscle aches); Crestor 5 mg and 10 mg daily (memory impairment), Livalo (throat and mouth itching), pravastatin 10 mg qd (memory)  . Cephalexin Itching and Rash  . Ciprofloxacin Rash  . Clindamycin/Lincomycin Rash  . Erythromycin Itching and Rash  . Penicillins Itching and Rash  . Propylene Glycol Itching and Rash  . Sulfa Antibiotics Itching and Rash  . Tetracycline Itching and Rash    Objective:   Ht 5' 3.5" (1.613 m)   Wt 150 lb (68 kg)   BMI 26.15 kg/m   Pt is able to speak clearly, coherently without shortness of breath or increased work of breathing.  Thought process is linear.  Mood is appropriate.   Assessment and Plan:   Dysuria- new.  Pt denies hx of UTI but has had suspicious sxs since Wednesday and it now sounds like she is having hematuria.  Unable to do UA or culture today but will start Macrobid empirically.  We reviewed her many allergies- Keflex, Cipro, Bactrim, PCN- and at this time, the best choice seems to be Macrobid.  Did review what to do in case of allergic rxn- if rash/hives she is to stop med and take Claritin or Zyrtec.  If any swelling, shortness of breath or difficulty swallowing she needs to go to ER.  Pt expressed understanding and is in agreement w/ plan.   Annye Asa, MD 02/22/2020  Time spent with the patient: 8 minutes, of which >50% was spent in obtaining information about symptoms, reviewing previous labs, evaluations, and treatments, counseling about condition (please see the discussed topics above), and developing a plan to further investigate it; had a number of questions which I addressed.

## 2020-06-25 ENCOUNTER — Ambulatory Visit: Payer: Medicare Other | Admitting: Family Medicine

## 2020-07-14 ENCOUNTER — Ambulatory Visit: Payer: Medicare Other | Admitting: Cardiovascular Disease

## 2020-07-14 ENCOUNTER — Encounter: Payer: Self-pay | Admitting: Cardiovascular Disease

## 2020-07-14 ENCOUNTER — Other Ambulatory Visit: Payer: Self-pay

## 2020-07-14 VITALS — BP 122/68 | HR 81 | Ht 63.5 in | Wt 141.8 lb

## 2020-07-14 DIAGNOSIS — I25119 Atherosclerotic heart disease of native coronary artery with unspecified angina pectoris: Secondary | ICD-10-CM | POA: Diagnosis not present

## 2020-07-14 DIAGNOSIS — E782 Mixed hyperlipidemia: Secondary | ICD-10-CM | POA: Diagnosis not present

## 2020-07-14 DIAGNOSIS — I10 Essential (primary) hypertension: Secondary | ICD-10-CM | POA: Diagnosis not present

## 2020-07-14 NOTE — Progress Notes (Signed)
Cardiology Office Note:    Date:  07/14/2020   ID:  Victoria [name], DOB 1941-05-17, MRN [id number]  PCP:  Leamon Arnt, MD  [facility] Cardiologist:  [name], MD  [facility] Electrophysiologist:  None   Referring MD: Leamon Arnt, MD   Chief Complaint  Patient presents with   Chest Pain    History of Present Illness:    Victoria [name] is a 79 y.o. female with a hx of  coronary artery disease who initially presented in 2009 with non-ST elevation MI treated with balloon angioplasty of a small circumflex branch. In 2016 she developed progressive angina and underwent repeat catheterization with demonstration of moderate diffuse mid LAD stenosis and borderline FFR analysis. Medical therapy was recommended. She had a nuclear scan in May 2019 demonstrating no ischemia and normal LVEF of 75%. She has had chronic angina over the years.   She is here with her husband today.  She reports no significant pain in the severity or intensity of her chest discomfort.  She does have occasional chest pain, sometimes described as a soreness and other times a sharp pinch.  She does not have any typical exertional angina.  States she will have discomfort for 3 to 4 days and then no symptoms for a few months.  She has mild exertional dyspnea which is unchanged over time.  No orthopnea, PND, heart palpitations, or leg swelling.  She has not been taking additional aspirin like she used to.  She has used nitroglycerin periodically but it gives her a bad headache and so its use has been very limited.  Past Medical History:  Diagnosis Date   Acute lumbar radiculopathy 04/2012   Anemia    history of   Anxiety    Atopic eczema    CAD (coronary artery disease)    Complication of anesthesia    Depression    Dyspnea    with activity   Essential hypertension, benign    Hyperlipidemia, statin intolerant    Hypothyroidism    Myocardial infarction [facility]) 2009   NSTEMI  (non-ST elevated myocardial infarction) ([place]) 2009   Osteoporosis, s/p bisphophonates x 7 years, stopped in 2011    Pneumonia    PONV (postoperative nausea and vomiting)    Sacrum and coccyx fracture [facility])     Past Surgical History:  Procedure Laterality Date   APPENDECTOMY     CARDIAC CATHETERIZATION  2009   CARPAL TUNNEL RELEASE Bilateral    cateract syrgery Bilateral 10/2018 and 11/2018   HIP SURGERY  1980s   due to infection    LAPAROSCOPIC HYSTERECTOMY     LEFT HEART CATHETERIZATION WITH CORONARY ANGIOGRAM N/A 11/18/2013   Procedure: LEFT HEART CATHETERIZATION WITH CORONARY ANGIOGRAM;  Surgeon: Blane Ohara, MD;  Location[facility] C[facility] LAB;  Service: Cardiovascular;  Laterality: N/A;   Left interior parathyroiectomy     Dr. Harlow Asa 08-14-18   LUMBAR LAMINECTOMY/DECOMPRESSION MICRODISCECTOMY  04/17/2012   Procedure: LUMBAR LAMINECTOMY/DECOMPRESSION MICRODISCECTOMY 1 LEVEL;  Surgeon: Erline Levine, MD;  Location: [place] [facility] ORS;  Service: Neurosurgery;  Laterality: Left;  Left Lumbar five-Sacral one Microdiskectomy   PARATHYROIDECTOMY Left 08/14/2018   Procedure: LEFT INFERIOR PARATHYROIDECTOMY;  Surgeon: Armandina Gemma, MD;  Location: [facility] ORS;  Service: General;  Laterality: Left;   SPINE SURGERY  03/2018   Dr Vertell Limber   TONSILLECTOMY      Current Medications: Current Meds  Medication Sig   amLODipine (NORVASC) 5 MG tablet Take 1 tablet (5 mg total)  by mouth daily.   aspirin 81 MG tablet Take 81 mg by mouth daily.     atorvastatin (LIPITOR) 10 MG tablet Take 1 tablet (10 mg total) by mouth 3 (three) times a week.   Cholecalciferol (VITAMIN D) 2000 UNITS CAPS Take 1 capsule (2,000 Units total) by mouth daily.   clobetasol (TEMOVATE) 0.05 % external solution Apply 1 application topically daily as needed. to scalp.   Coenzyme Q10 (CO Q-10) 300 MG CAPS Take 300 mg by mouth daily.    Desoximetasone (TOPICORT) 0.25 % ointment Apply 1 application topically 2 (two) times  daily as needed (itching). Apply to hands.    EPINEPHrine 0.3 mg/0.3 mL IJ SOAJ injection Inject 0.3 mg into the muscle Once PRN.   ezetimibe (ZETIA) 10 MG tablet Take 1 tablet (10 mg total) by mouth daily.   loratadine (CLARITIN) 10 MG tablet Take 10 mg by mouth daily as needed for allergies.   Multiple Vitamins-Minerals (EYE VITAMINS) TABS Take 1 tablet by mouth 2 (two) times daily.    nitroGLYCERIN (NITROSTAT) 0.4 MG SL tablet PLACE 1 TABLET UNDER THE                 TONGUE AND ALLOW TO                      DISSOLVE EVERY 5 MINUTES AS              NEEDED FOR CHEST PAIN AS DIRECTED BY YOUR DOCTOR   SYNTHROID 75 MCG tablet TAKE 1 TABLET DAILY     Allergies:   Shellfish allergy, Codeine, Imdur [isosorbide nitrate], Lanolin, Latex, Pollen extract, Statins, Cephalexin, Ciprofloxacin, Clindamycin/lincomycin, Erythromycin, Penicillins, Propylene glycol, Sulfa antibiotics, and Tetracycline   Social History   Socioeconomic History   Marital status: Married    Spouse name: Not on file   Number of children: 2   Years of education: Not on file   Highest education level: Not on file  Occupational History   Occupation: retired  Tobacco Use   Smoking status: Never Smoker   Smokeless tobacco: Never Used  Scientific laboratory technician Use: Never used  Substance and Sexual Activity   Alcohol use: Yes    Alcohol/week: 1.0 standard drink    Types: 1 Glasses of wine per week    Comment: occasional glass of wine   Drug use: No   Sexual activity: Not Currently  Other Topics Concern   Not on file  Social History Narrative   Not on file   Social Determinants of Health   Financial Resource Strain:    Difficulty of Paying Living Expenses: Not on file  Food Insecurity:    Worried About [place] in the Last Year: Not on file   Ran Out of Food in the Last Year: Not on file  Transportation Needs:    Lack of Transportation (Medical): Not on file   Lack of Transportation  (Non-Medical): Not on file  Physical Activity:    Days of Exercise per Week: Not on file   Minutes of Exercise per Session: Not on file  Stress:    Feeling of Stress : Not on file  Social Connections:    Frequency of Communication with Friends and Family: Not on file   Frequency of Social Gatherings with Friends and Family: Not on file   Attends Religious Services: Not on file   Active Member of Clubs or Organizations: Not on file   Attends Club  or Organization Meetings: Not on file   Marital Status: Not on file     Family History: The patient's family history includes Colon cancer (age of onset: 58) in her mother; Stroke in an other family member; Stroke (age of onset: 63) in her father.  ROS:   Please see the history of present illness.    All other systems reviewed and are negative.  EKGs/Labs/Other Studies Reviewed:    The following studies were reviewed today: Myocardial Perfusion Scan 02/20/2018: Study Highlights    Nuclear stress EF: 75%.  There was no ST segment deviation noted during stress.  The left ventricular ejection fraction is hyperdynamic (>65%).   Poor quality study with diffusely low counts in apex and inferior wall on all images No evidence of ischemia EF 75%   EKG:  EKG is not ordered today.    Recent Labs: 12/24/2019: ALT 19; BUN 20; Creatinine, Ser 0.84; Hemoglobin 13.3; Platelets 248.0; Potassium 3.9; Sodium 141; TSH 0.78  Recent Lipid Panel    Component Value Date/Time   CHOL 141 12/24/2019 0856   CHOL 144 04/23/2019 0916   TRIG 94.0 12/24/2019 0856   HDL 43.90 12/24/2019 0856   HDL 50 04/23/2019 0916   CHOLHDL 3 12/24/2019 0856   VLDL 18.8 12/24/2019 0856   LDLCALC 78 12/24/2019 0856   LDLCALC 79 04/23/2019 0916     Risk Assessment/Calculations:       Physical Exam:    VS:  BP 122/68    Pulse 81    Ht 5' 3.5" (1.613 m)    Wt 141 lb 12.8 oz (64.3 kg)    SpO2 95%    BMI 24.72 kg/m     Wt Readings from Last 3  Encounters:  07/14/20 141 lb 12.8 oz (64.3 kg)  02/22/20 150 lb (68 kg)  01/15/20 153 lb 6.4 oz (69.6 kg)     GEN:  Well nourished, well developed in no acute distress HEENT: Normal NECK: No JVD; No carotid bruits LYMPHATICS: No lymphadenopathy CARDIAC: RRR, no murmurs, rubs, gallops RESPIRATORY:  Clear to auscultation without rales, wheezing or rhonchi  ABDOMEN: Soft, non-tender, non-distended MUSCULOSKELETAL:  No edema; No deformity  SKIN: Warm and dry NEUROLOGIC:  Alert and oriented x 3 PSYCHIATRIC:  Normal affect   ASSESSMENT:    1. Coronary artery disease involving native coronary artery of native heart with angina pectoris ([place])   2. Essential hypertension   3. Mixed hyperlipidemia    PLAN:    In order of problems listed above:  1. Stable symptoms of angina.  We discussed stress testing versus ongoing medical therapy and she prefers the latter.  Her last stress test from 2019 is reviewed today.  She will continue on aspirin, amlodipine, atorvastatin, and ezetimibe. 2. Blood pressure well controlled on amlodipine. 3. Most recent lipids reviewed with cholesterol 141, HDL 44, LDL 78.  ALT is 19.  Continue current therapy.  She has had some trouble with medication intolerances but is doing well on her current regimen.   Medication Adjustments/Labs and Tests Ordered: Current medicines are reviewed at length with the patient today.  Concerns regarding medicines are outlined above.  No orders of the defined types were placed in this encounter.  No orders of the defined types were placed in this encounter.   Patient Instructions  Medication Instructions:  Your provider recommends that you continue on your current medications as directed. Please refer to the Current Medication list given to you today.   *If you need  a refill on your cardiac medications before your next appointment, please call your pharmacy*  Follow-Up: At [facility], you and your health needs are our  priority.  As part of our continuing mission to provide you with exceptional heart care, we have created designated Provider Care Teams.  These Care Teams include your primary Cardiologist (physician) and Advanced Practice Providers (APPs -  Physician Assistants and Nurse Practitioners) who all work together to provide you with the care you need, when you need it. Your next appointment:   6 month(s) The format for your next appointment:   In Person Provider:   [name], MD     Signed, [name], MD  07/14/2020 9:11 AM    [place]

## 2020-07-14 NOTE — Patient Instructions (Signed)
Medication Instructions:  Your provider recommends that you continue on your current medications as directed. Please refer to the Current Medication list given to you today.   *If you need a refill on your cardiac medications before your next appointment, please call your pharmacy*  Follow-Up: At [facility], you and your health needs are our priority.  As part of our continuing mission to provide you with exceptional heart care, we have created designated Provider Care Teams.  These Care Teams include your primary Cardiologist (physician) and Advanced Practice Providers (APPs -  Physician Assistants and Nurse Practitioners) who all work together to provide you with the care you need, when you need it. Your next appointment:   6 month(s) The format for your next appointment:   In Person Provider:   [name], MD

## 2020-08-18 ENCOUNTER — Ambulatory Visit: Payer: Medicare Other | Admitting: Family Medicine

## 2020-08-18 ENCOUNTER — Other Ambulatory Visit: Payer: Self-pay

## 2020-08-18 ENCOUNTER — Encounter: Payer: Self-pay | Admitting: Family Medicine

## 2020-08-18 VITALS — BP 128/80 | HR 96 | Temp 98.7°F | Ht 63.5 in | Wt 143.8 lb

## 2020-08-18 DIAGNOSIS — L219 Seborrheic dermatitis, unspecified: Secondary | ICD-10-CM

## 2020-08-18 DIAGNOSIS — I2510 Atherosclerotic heart disease of native coronary artery without angina pectoris: Secondary | ICD-10-CM

## 2020-08-18 DIAGNOSIS — E782 Mixed hyperlipidemia: Secondary | ICD-10-CM

## 2020-08-18 DIAGNOSIS — I10 Essential (primary) hypertension: Secondary | ICD-10-CM | POA: Diagnosis not present

## 2020-08-18 DIAGNOSIS — L2084 Intrinsic (allergic) eczema: Secondary | ICD-10-CM

## 2020-08-18 DIAGNOSIS — E039 Hypothyroidism, unspecified: Secondary | ICD-10-CM | POA: Diagnosis not present

## 2020-08-18 MED ORDER — KETOCONAZOLE 2 % EX SHAM: 1.0000 "application " | MEDICATED_SHAMPOO | CUTANEOUS | 1 refills | Status: DC

## 2020-08-18 NOTE — Progress Notes (Signed)
Subjective  CC:  Chief Complaint  Patient presents with  . Hypothyroidism  . Alopecia    2-3 months   . Eczema    last couple of months, patient uses cream every few hours.  . Depression    paitent states she been under a lot of stress   . Weight Loss    loss 12 lbs    HPI: Victoria [name] is a 79 y.o. female who presents to the office today to address the problems listed above in the chief complaint.  79 year old with well-controlled hypothyroidism presents due to several complaints: Worsening mood mainly reactive due to having a difficult year.  Husband has been sick, lost very important family member, Covid, transition from home to adult community center up.  No history of depression.  Feels like she is coping well but stress has caught up with her.  Sleep is good.  Stress is induced her eczema.  Complains of hair thinning, red itching flaking rash on her scalp, brow and scalp lines.  Significant eczematous patches on face and arms.  Has a dermatologist.  Using very rare steroid cream.  Itching is very bothersome.  Would like help with this and wants to make sure it is not due to her thyroid levels.  Coronary disease: Reviewed recent cardiology visit.  No new work-up for ischemia.  Does experience intermittent chest pains.  No heart failure.  Hyperlipidemia: Tolerating Crestor 3 times weekly and Zetia.  Due for recheck of lipids.  Weight loss: Down 7 pounds in the last 6 months.  She denies abdominal pain, nausea, vomiting, but admits appetite has been down.  Has been very active with mood and then stresses decreased appetite.  She denies palpitations, heat intolerance, sweats, melena, blood in stool, fevers or chills.  Wt Readings from Last 3 Encounters:  08/18/20 143 lb 12.8 oz (65.2 kg)  07/14/20 141 lb 12.8 oz (64.3 kg)  02/22/20 150 lb (68 kg)       Office Visit from 08/18/2020 in [place]  PHQ-9 Total Score 7       Wt Readings from  Last 3 Encounters:  08/18/20 143 lb 12.8 oz (65.2 kg)  07/14/20 141 lb 12.8 oz (64.3 kg)  02/22/20 150 lb (68 kg)    Assessment  1. Acquired hypothyroidism   2. Coronary artery disease involving native coronary artery of native heart, unspecified whether angina present   3. Essential hypertension, benign   4. Mixed hyperlipidemia   5. Intrinsic eczema   6. Seborrheic dermatitis      Plan   Hypothyroidism: Has been well controlled, will recheck levels today.  Eczema and seborrheic dermatitis: Moderate to severe, recommend follow-up with dermatology.  Patient taken appointment.  In the meantime ketoconazole cream.  She can use lotions solutions due to allergy.  Continue steroid cream.  Reassured.  Hypertension hyperlipidemia: Check lab work today.  Blood pressures are controlled.  Mood: Down but appropriate.  Monitor.  Education counseling given.  Weight loss: Recommend monitoring caloric intake and trying to regain weight if needed.  Check lab work.  No red flags noted by history at this time.  Cancer screenings are up-to-date  Follow up: Return in about 6 months (around 02/15/2021) for complete physical.  Visit date not found  Orders Placed This Encounter  Procedures  . TSH  . Lipid panel  . CBC with Differential/Platelet  . COMPLETE METABOLIC PANEL WITH GFR   Meds ordered this encounter  Medications  .  ketoconazole (NIZORAL) 2 % shampoo    Sig: Apply 1 application topically 2 (two) times a week.    Dispense:  120 mL    Refill:  1      I reviewed the patients updated PMH, FH, and SocHx.    Patient Active Problem List   Diagnosis Date Noted  . History of primary hyperparathyroidism 06/27/2018    Priority: High  . Chronic right-sided low back pain with right-sided sciatica 08/15/2017    Priority: High  . Essential hypertension, benign 05/14/2011    Priority: High  . CAD (coronary artery disease) 01/19/2009    Priority: High  . Acquired hypothyroidism 12/04/2008      Priority: High  . Mixed hyperlipidemia 12/04/2008    Priority: High  . History of non-ST elevation myocardial infarction (NSTEMI) 12/04/2008    Priority: High  . Age-related osteoporosis without fracture 12/04/2008    Priority: Medium  . Hx of laminectomy L5-1 10/31/2018    Priority: Low  . Vitamin D deficiency 08/02/2010    Priority: Low  . Allergic rhinitis 07/30/2010    Priority: Low  . Multiple drug allergies 06/04/2019   Current Meds  Medication Sig  . amLODipine (NORVASC) 5 MG tablet Take 1 tablet (5 mg total) by mouth daily.  Marland Kitchen aspirin 81 MG tablet Take 81 mg by mouth daily.    Marland Kitchen atorvastatin (LIPITOR) 10 MG tablet Take 1 tablet (10 mg total) by mouth 3 (three) times a week.  . Cholecalciferol (VITAMIN D) 2000 UNITS CAPS Take 1 capsule (2,000 Units total) by mouth daily.  . clobetasol (TEMOVATE) 0.05 % external solution Apply 1 application topically daily as needed. to scalp.  . Coenzyme Q10 (CO Q-10) 300 MG CAPS Take 300 mg by mouth daily.   . Desoximetasone (TOPICORT) 0.25 % ointment Apply 1 application topically 2 (two) times daily as needed (itching). Apply to hands.   Marland Kitchen EPINEPHrine 0.3 mg/0.3 mL IJ SOAJ injection Inject 0.3 mg into the muscle Once PRN.  Marland Kitchen ezetimibe (ZETIA) 10 MG tablet Take 1 tablet (10 mg total) by mouth daily.  Marland Kitchen loratadine (CLARITIN) 10 MG tablet Take 10 mg by mouth daily as needed for allergies.  . Multiple Vitamins-Minerals (EYE VITAMINS) TABS Take 1 tablet by mouth 2 (two) times daily.   . nitroGLYCERIN (NITROSTAT) 0.4 MG SL tablet PLACE 1 TABLET UNDER THE                 TONGUE AND ALLOW TO                      DISSOLVE EVERY 5 MINUTES AS              NEEDED FOR CHEST PAIN AS DIRECTED BY YOUR DOCTOR  . SYNTHROID 75 MCG tablet TAKE 1 TABLET DAILY    Allergies: Patient is allergic to shellfish allergy, codeine, imdur [isosorbide nitrate], lanolin, latex, pollen extract, statins, cephalexin, ciprofloxacin, clindamycin/lincomycin, erythromycin,  penicillins, propylene glycol, sulfa antibiotics, and tetracycline. Family History: Patient family history includes Colon cancer (age of onset: 66) in her mother; Stroke in an other family member; Stroke (age of onset: 23) in her father. Social History:  Patient  reports that she has never smoked. She has never used smokeless tobacco. She reports current alcohol use of about 1.0 standard drink of alcohol per week. She reports that she does not use drugs.  Review of Systems: Constitutional: Negative for fever malaise or anorexia Cardiovascular: negative for chest pain Respiratory: negative  for SOB or persistent cough Gastrointestinal: negative for abdominal pain  Objective  Vitals: BP 128/80   Pulse 96   Temp 98.7 F (37.1 C) (Temporal)   Ht 5' 3.5" (1.613 m)   Wt 143 lb 12.8 oz (65.2 kg)   SpO2 98%   BMI 25.07 kg/m  General: no acute distress , A&Ox3 HEENT: PEERL, conjunctiva normal, neck is supple Cardiovascular:  RRR without murmur or gallop.  Respiratory:  Good breath sounds bilaterally, CTAB with normal respiratory effort Skin:  Warm, eczematous erythema patches on flexural elbows, temporal area, flaking red scalp rash present Neuro: No tremor No lower extremity edema     Commons side effects, risks, benefits, and alternatives for medications and treatment plan prescribed today were discussed, and the patient expressed understanding of the given instructions. Patient is instructed to call or message via MyChart if he/she has any questions or concerns regarding our treatment plan. No barriers to understanding were identified. We discussed Red Flag symptoms and signs in detail. Patient expressed understanding regarding what to do in case of urgent or emergency type symptoms.   Medication list was reconciled, printed and provided to the patient in AVS. Patient instructions and summary information was reviewed with the patient as documented in the AVS. This note was prepared with  assistance of Dragon voice recognition software. Occasional wrong-word or sound-a-like substitutions may have occurred due to the inherent limitations of voice recognition software  This visit occurred during the SARS-CoV-2 public health emergency.  Safety protocols were in place, including screening questions prior to the visit, additional usage of staff PPE, and extensive cleaning of exam room while observing appropriate contact time as indicated for disinfecting solutions.

## 2020-08-18 NOTE — Patient Instructions (Addendum)
Please return in 6 months for your annual complete physical; please come fasting.  I will release your lab results to you on your MyChart account with further instructions. Please reply with any questions.   We will call you with information regarding your referral appointment. Dermatology for your skin condition.  If you do not hear from Victoria [name] within the next 2 weeks, please let me know. It can take 1-2 weeks to get appointments set up with the specialists.   If you have any questions or concerns, please don't hesitate to send me a message via MyChart or call the office at [phone]. Thank you for visiting with Victoria [name] today! It's our pleasure caring for you.  Seborrheic Dermatitis, Adult Seborrheic dermatitis is a skin disease that causes red, scaly patches. It usually occurs on the scalp, and it is often called dandruff. The patches may appear on other parts of the body. Skin patches tend to appear where there are many oil glands in the skin. Areas of the body that are commonly affected include:  Scalp.  Skin folds of the body.  Ears.  Eyebrows.  Neck.  Face.  Armpits.  The bearded area of men's faces. The condition may come and go for no known reason, and it is often long-lasting (chronic). What are the causes? The cause of this condition is not known. What increases the risk? This condition is more likely to develop in people who:  Have certain conditions, such as: ? HIV (human immunodeficiency virus). ? AIDS (acquired immunodeficiency syndrome). ? Parkinson disease. ? Mood disorders, such as depression.  Are 2-85 years old. What are the signs or symptoms? Symptoms of this condition include:  Thick scales on the scalp.  Redness on the face or in the armpits.  Skin that is flaky. The flakes may be white or yellow.  Skin that seems oily or dry but is not helped with moisturizers.  Itching or burning in the affected areas. How is this diagnosed? This condition is  diagnosed with a medical history and physical exam. A sample of your skin may be tested (skin biopsy). You may need to see a skin specialist (dermatologist). How is this treated? There is no cure for this condition, but treatment can help to manage the symptoms. You may get treatment to remove scales, lower the risk of skin infection, and reduce swelling or itching. Treatment may include:  Creams that reduce swelling and irritation (steroids).  Creams that reduce skin yeast.  Medicated shampoo, soaps, moisturizing creams, or ointments.  Medicated moisturizing creams or ointments. Follow these instructions at home:  Apply over-the-counter and prescription medicines only as told by your health care provider.  Use any medicated shampoo, soaps, skin creams, or ointments only as told by your health care provider.  Keep all follow-up visits as told by your health care provider. This is important. Contact a health care provider if:  Your symptoms do not improve with treatment.  Your symptoms get worse.  You have new symptoms. This information is not intended to replace advice given to you by your health care provider. Make sure you discuss any questions you have with your health care provider. Document Revised: 09/01/2017 Document Reviewed: 01/07/2016 Elsevier Patient Education  [place].

## 2020-08-19 LAB — CBC WITH DIFFERENTIAL/PLATELET
Absolute Monocytes: 528 cells/uL (ref 200–950)
Basophils Absolute: 40 cells/uL (ref 0–200)
Basophils Relative: 0.6 %
Eosinophils Absolute: 521 cells/uL — ABNORMAL HIGH (ref 15–500)
Eosinophils Relative: 7.9 %
HCT: 39.1 % (ref 35.0–45.0)
Hemoglobin: 13.4 g/dL (ref 11.7–15.5)
Lymphs Abs: 2244 cells/uL (ref 850–3900)
MCH: 31.7 pg (ref 27.0–33.0)
MCHC: 34.3 g/dL (ref 32.0–36.0)
MCV: 92.4 fL (ref 80.0–100.0)
MPV: 10.1 fL (ref 7.5–12.5)
Monocytes Relative: 8 %
Neutro Abs: 3267 cells/uL (ref 1500–7800)
Neutrophils Relative %: 49.5 %
Platelets: 272 10*3/uL (ref 140–400)
RBC: 4.23 10*6/uL (ref 3.80–5.10)
RDW: 12.1 % (ref 11.0–15.0)
Total Lymphocyte: 34 %
WBC: 6.6 10*3/uL (ref 3.8–10.8)

## 2020-08-19 LAB — COMPLETE METABOLIC PANEL WITH GFR
AG Ratio: 1.4 (calc) (ref 1.0–2.5)
ALT: 12 U/L (ref 6–29)
AST: 15 U/L (ref 10–35)
Albumin: 4.2 g/dL (ref 3.6–5.1)
Alkaline phosphatase (APISO): 64 U/L (ref 37–153)
BUN: 14 mg/dL (ref 7–25)
CO2: 26 mmol/L (ref 20–32)
Calcium: 9.7 mg/dL (ref 8.6–10.4)
Chloride: 106 mmol/L (ref 98–110)
Creat: 0.92 mg/dL (ref 0.60–0.93)
GFR, Est African American: 69 mL/min/{1.73_m2} (ref >=60)
GFR, Est Non African American: 59 mL/min/{1.73_m2} — ABNORMAL LOW (ref >=60)
Globulin: 2.9 g/dL (calc) (ref 1.9–3.7)
Glucose, Bld: 67 mg/dL (ref 65–99)
Potassium: 4.2 mmol/L (ref 3.5–5.3)
Sodium: 142 mmol/L (ref 135–146)
Total Bilirubin: 0.4 mg/dL (ref 0.2–1.2)
Total Protein: 7.1 g/dL (ref 6.1–8.1)

## 2020-08-19 LAB — LIPID PANEL
Cholesterol: 156 mg/dL (ref <200)
HDL: 53 mg/dL (ref >=50)
LDL Cholesterol (Calc): 84 mg/dL (calc)
Non-HDL Cholesterol (Calc): 103 mg/dL (calc) (ref <130)
Total CHOL/HDL Ratio: 2.9 (calc) (ref <5.0)
Triglycerides: 99 mg/dL (ref <150)

## 2020-08-19 LAB — TSH: TSH: 0.66 mIU/L (ref 0.40–4.50)

## 2020-11-18 ENCOUNTER — Ambulatory Visit: Payer: Medicare Other | Admitting: Cardiovascular Disease

## 2020-12-14 ENCOUNTER — Other Ambulatory Visit: Payer: Self-pay

## 2020-12-14 ENCOUNTER — Telehealth: Payer: Self-pay

## 2020-12-14 DIAGNOSIS — E039 Hypothyroidism, unspecified: Secondary | ICD-10-CM

## 2020-12-14 MED ORDER — LEVOTHYROXINE SODIUM 75 MCG PO TABS: 75.0000 ug | ORAL_TABLET | Freq: Every day | ORAL | 3 refills | Status: DC

## 2020-12-14 NOTE — Telephone Encounter (Signed)
Refill sent to pharmacy.   

## 2020-12-14 NOTE — Telephone Encounter (Signed)
  LAST APPOINTMENT DATE: 08/18/2020   NEXT APPOINTMENT DATE:@Visit  date not found  MEDICATION:SYNTHROID 67 MCG tablet  PHARMACY:[facility] [place] [facility], [place] AT Portal to Registered [employer] Sites   Please advise

## 2021-02-02 ENCOUNTER — Other Ambulatory Visit (HOSPITAL_COMMUNITY): Payer: Self-pay

## 2021-02-02 ENCOUNTER — Telehealth (INDEPENDENT_AMBULATORY_CARE_PROVIDER_SITE_OTHER): Payer: Medicare Other | Admitting: Family Medicine

## 2021-02-02 DIAGNOSIS — U071 COVID-19: Secondary | ICD-10-CM | POA: Diagnosis not present

## 2021-02-02 MED ORDER — MOLNUPIRAVIR 200 MG PO CAPS
800.0000 mg | ORAL_CAPSULE | Freq: Two times a day (BID) | ORAL | 0 refills | Status: AC
  Filled 2021-02-02: qty 40, 5d supply, fill #0

## 2021-02-02 NOTE — Progress Notes (Signed)
Virtual Visit via Video Note  I connected with Nalayah  on 02/02/21 at 10:20 AM EDT by a video enabled telemedicine application and verified that I am speaking with the correct person using two identifiers.  Location patient: home, [place] Location provider:work or home office Persons participating in the virtual visit: patient, provider, patient's husband  I discussed the limitations of evaluation and management by telemedicine and the availability of in person appointments. The patient expressed understanding and agreed to proceed.   HPI:  Acute telemedicine visit for COVID19: -Onset: 01/29/21, tested positive yesterday - husband positive as well (they were exposed on a trip to [place]) -Symptoms include: runny nose, cough, body aches -Denies: fever, CP, SOB, NVD, headache, malaise, inability to eat/drink/get out of bed -Has tried: contact -Pertinent past medical history: CAD, Hx myocardial infarction, HTN, age > 28, hypothyroidism,  -Pertinent medication allergies: Allergies  Allergen Reactions  . Shellfish Allergy Hives and Shortness Of Breath  . Codeine Other (See Comments)    Headache  . Imdur [Isosorbide Nitrate] Other (See Comments)    Confusion  . Lanolin Itching  . Latex Other (See Comments)    Unknown, allergy testing result  . Pollen Extract   . Statins     Simvastatin, Lipitor (both caused muscle aches); Crestor 5 mg and 10 mg daily (memory impairment), Livalo (throat and mouth itching), pravastatin 10 mg qd (memory)  . Cephalexin Itching and Rash  . Ciprofloxacin Rash  . Clindamycin/Lincomycin Rash  . Erythromycin Itching and Rash  . Penicillins Itching and Rash  . Propylene Glycol Itching and Rash  . Sulfa Antibiotics Itching and Rash  . Tetracycline Itching and Rash   -COVID-19 vaccine status: has had pfizer vaccine and booster  ROS: See pertinent positives and negatives per HPI.  Past Medical History:  Diagnosis Date  . Acute lumbar radiculopathy 04/2012  .  Anemia    history of  . Anxiety   . Atopic eczema   . CAD (coronary artery disease)   . Complication of anesthesia   . Depression   . Dyspnea    with activity  . Essential hypertension, benign   . Hyperlipidemia, statin intolerant   . Hypothyroidism   . Myocardial infarction ([place]) 2009  . NSTEMI (non-ST elevated myocardial infarction) ([place]) 2009  . Osteoporosis, s/p bisphophonates x 7 years, stopped in 2011   . Pneumonia   . PONV (postoperative nausea and vomiting)   . Sacrum and coccyx fracture [facility])     Past Surgical History:  Procedure Laterality Date  . APPENDECTOMY    . CARDIAC CATHETERIZATION  2009  . CARPAL TUNNEL RELEASE Bilateral   . cateract syrgery Bilateral 10/2018 and 11/2018  . HIP SURGERY  1980s   due to infection   . LAPAROSCOPIC HYSTERECTOMY    . LEFT HEART CATHETERIZATION WITH CORONARY ANGIOGRAM N/A 11/18/2013   Procedure: LEFT HEART CATHETERIZATION WITH CORONARY ANGIOGRAM;  Surgeon: Blane Ohara, MD;  Location: [facility] CATH LAB;  Service: Cardiovascular;  Laterality: N/A;  . Left interior parathyroiectomy     Dr. Harlow Asa 08-14-18  . LUMBAR LAMINECTOMY/DECOMPRESSION MICRODISCECTOMY  04/17/2012   Procedure: LUMBAR LAMINECTOMY/DECOMPRESSION MICRODISCECTOMY 1 LEVEL;  Surgeon: Erline Levine, MD;  Location: [facility] OR[facility];  Service: Neurosurgery;  Laterality: Left;  Left Lumbar five-Sacral one Microdiskectomy  . PARATHYROIDECTOMY Left 08/14/2018   Procedure: LEFT INFERIOR PARATHYROIDECTOMY;  Surgeon: Armandina Gemma, MD;  Location: [facility] ORS;  Service: General;  Laterality: Left;  . SPINE SURGERY  03/2018   Dr Vertell Limber  .  TONSILLECTOMY    . TOOTH EXTRACTION    . tooth implant       Current Outpatient Medications:  Marland Kitchen  Molnupiravir 200 MG CAPS, Take 4 capsules (800 mg total) by mouth in the morning and at bedtime for 5 days., Disp: 40 capsule, Rfl: 0 .  amLODipine (NORVASC) 5 MG tablet, Take 1 tablet (5 mg total) by mouth daily., Disp: 90 tablet, Rfl: 3 .  aspirin 81 MG  tablet, Take 81 mg by mouth daily.  , Disp: , Rfl:  .  atorvastatin (LIPITOR) 10 MG tablet, Take 1 tablet (10 mg total) by mouth 3 (three) times a week., Disp: 36 tablet, Rfl: 3 .  Cholecalciferol (VITAMIN D) 2000 UNITS CAPS, Take 1 capsule (2,000 Units total) by mouth daily., Disp: 30 capsule, Rfl:  .  clobetasol (TEMOVATE) 0.05 % external solution, Apply 1 application topically daily as needed. to scalp., Disp: 50 mL, Rfl: 1 .  Coenzyme Q10 (CO Q-10) 300 MG CAPS, Take 300 mg by mouth daily. , Disp: , Rfl:  .  Desoximetasone (TOPICORT) 0.25 % ointment, Apply 1 application topically 2 (two) times daily as needed (itching). Apply to hands. , Disp: , Rfl:  .  EPINEPHrine 0.3 mg/0.3 mL IJ SOAJ injection, Inject 0.3 mg into the muscle Once PRN., Disp: , Rfl:  .  ezetimibe (ZETIA) 10 MG tablet, Take 1 tablet (10 mg total) by mouth daily., Disp: 90 tablet, Rfl: 3 .  ketoconazole (NIZORAL) 2 % shampoo, Apply 1 application topically 2 (two) times a week., Disp: 120 mL, Rfl: 1 .  levothyroxine (SYNTHROID) 75 MCG tablet, Take 1 tablet (75 mcg total) by mouth daily., Disp: 90 tablet, Rfl: 3 .  loratadine (CLARITIN) 10 MG tablet, Take 10 mg by mouth daily as needed for allergies., Disp: , Rfl:  .  Multiple Vitamins-Minerals (EYE VITAMINS) TABS, Take 1 tablet by mouth 2 (two) times daily. , Disp: , Rfl:  .  nitroGLYCERIN (NITROSTAT) 0.4 MG SL tablet, PLACE 1 TABLET UNDER THE                 TONGUE AND ALLOW TO                      DISSOLVE EVERY 5 MINUTES AS              NEEDED FOR CHEST PAIN AS DIRECTED BY YOUR DOCTOR, Disp: 25 tablet, Rfl: 3  EXAM:  VITALS per patient if applicable:  GENERAL: alert, oriented, appears well and in no acute distress  HEENT: atraumatic, conjunttiva clear, no obvious abnormalities on inspection of external nose and ears  NECK: normal movements of the head and neck  LUNGS: on inspection no signs of respiratory distress, breathing rate appears normal, no obvious gross SOB,  gasping or wheezing  CV: no obvious cyanosis  MS: moves all visible extremities without noticeable abnormality  PSYCH/NEURO: pleasant and cooperative, no obvious depression or anxiety, speech and thought processing grossly intact  ASSESSMENT AND PLAN:  Discussed the following assessment and plan:  COVID-19  -we discussed possible serious and likely etiologies, options for evaluation and workup, limitations of telemedicine visit vs in person visit, treatment, treatment risks and precautions. Pt prefers to treat via telemedicine empirically rather than in person at this moment.  Discussed treatment options, ideal treatment window, potential complications, isolation and precautions for COVID-19.  The patient opted for treatment with Molnupiravir due to being higher risk for complications of covid or severe disease. Discussed  EUA status limited knowledge of risks/interactions/side effects per EUA document vs possible benefits and precautions shared with patient and provided in patient instructions. Also, advised that patient discuss risks/interactions and use with pharmacist/treatment team as well. Other symptomatic care measures summarized in patient instructions.  Scheduled follow up with PCP offered: agrees to follow up as needed Advised to seek prompt in person care if worsening, new symptoms arise, or if is not improving with treatment. Discussed options for inperson care if PCP office not available. Did let this patient know that I only do telemedicine on Tuesdays and Thursdays for [place]. Advised to schedule follow up visit with PCP or [facility] if any further questions or concerns to avoid delays in care.   I discussed the assessment and treatment plan with the patient. The patient was provided an opportunity to ask questions and all were answered. The patient agreed with the plan and demonstrated an understanding of the instructions.     Lucretia Kern, DO

## 2021-02-02 NOTE — Patient Instructions (Addendum)
HOME CARE TIPS:   -I sent the medication(s) we discussed to your pharmacy: Meds ordered this encounter  Medications  . Molnupiravir 200 MG CAPS    Sig: Take 4 capsules (800 mg total) by mouth in the morning and at bedtime for 5 days.    Dispense:  40 capsule    Refill:  0     -I sent in the [facility] treatment or referral you requested per our discussion. Please see the information provided below and discuss further with the pharmacist/treatment team. Seek immediate follow up with your primary care office or inperson if any concerns.  -can use tylenol for fevers, aches and pains per instructions  -can use nasal saline a few times per day if you have nasal congestion; sometimes  a short course of Afrin nasal spray for 3 days can help with symptoms as well  -stay hydrated, drink plenty of fluids and eat small healthy meals - avoid dairy  -can take 1000 IU (42mcg) Vit D3 and 100-500 mg of Vit C daily per instructions  -If the Covid test is positive, check out the [facility] website for more information on home care, transmission and treatment for COVID19  -follow up with your doctor in 2-3 days unless improving and feeling better  -stay home while sick, except to seek medical care. If you have COVID19, ideally it would be best to stay home for a full 10 days since the onset of symptoms PLUS one day of no fever and feeling better. Wear a good mask that fits snugly (such as N95 or KN95) if around others to reduce the risk of transmission.  It was nice to meet you today, and I really hope you are feeling better soon. I help [place] out with telemedicine visits on Tuesdays and Thursdays and am available for visits on those days. If you have any concerns or questions following this visit please schedule a follow up visit with your Primary Care doctor or seek care at a local urgent care clinic to avoid delays in care.    Seek in person care or schedule a follow up video visit promptly if your  symptoms worsen, new concerns arise or you are not improving with treatment. Call 911 and/or seek emergency care if your symptoms are severe or life threatening.        Fact Sheet for Patients And Caregivers Emergency Use Authorization (EUA) Of LAGEVRIOT (molnupiravir) capsules For Coronavirus Disease 2019 (COVID-19)  What is the most important information I should know about LAGEVRIO? LAGEVRIO may cause serious side effects, including: . LAGEVRIO may cause harm to your unborn baby. It is not known if LAGEVRIO will harm your baby if you take LAGEVRIO during pregnancy. o LAGEVRIO is not recommended for use in pregnancy. o LAGEVRIO has not been studied in pregnancy. LAGEVRIO was studied in pregnant animals only. When LAGEVRIO was given to pregnant animals, LAGEVRIO caused harm to their unborn babies. o You and your healthcare provider may decide that you should take LAGEVRIO during pregnancy if there are no other COVID-19 treatment options approved or authorized by the FDA that are accessible or clinically appropriate for you. o If you and your healthcare provider decide that you should take LAGEVRIO during pregnancy, you and your healthcare provider should discuss the known and potential benefits and the potential risks of taking LAGEVRIO during pregnancy. For individuals who are able to become pregnant: . You should use a reliable method of birth control (contraception) consistently and correctly during treatment with  LAGEVRIO and for 4 days after the last dose of LAGEVRIO. Talk to your healthcare provider about reliable birth control methods. . Before starting treatment with [facility] your healthcare provider may do a pregnancy test to see if you are pregnant before starting treatment with LAGEVRIO. . Tell your healthcare provider right away if you become pregnant or think you may be pregnant during treatment with LAGEVRIO. Pregnancy Surveillance Program: . There is a pregnancy  surveillance program for individuals who take LAGEVRIO during pregnancy. The purpose of this program is to collect information about the health of you and your baby. Talk to your healthcare provider about how to take part in this program. . If you take LAGEVRIO during pregnancy and you agree to participate in the pregnancy surveillance program and allow your healthcare provider to share your information with [place], then your healthcare provider will report your use of [place] during pregnancy to [email or web address] by calling [phone] or [email or web address]. For individuals who are sexually active with partners who are able to become pregnant: . It is not known if LAGEVRIO can affect sperm. While the risk is regarded as low, animal studies to fully assess the potential for LAGEVRIO to affect the babies of males treated with LAGEVRIO have not been completed. A reliable method of birth control (contraception) should be used consistently and correctly during treatment with LAGEVRIO and for at least 3 months after the last dose. The risk to sperm beyond 3 months is not known. Studies to understand the risk to sperm beyond 3 months are ongoing. Talk to your healthcare provider about reliable birth control methods. Talk to your healthcare provider if you have questions or concerns about how LAGEVRIO may affect sperm. You are being given this fact sheet because your healthcare provider believes it is necessary to provide you with LAGEVRIO for the treatment of adults with mild-to-moderate coronavirus disease 2019 (COVID-19) with positive results of direct SARS-CoV-2 viral testing, and who are at high risk for progression to severe COVID-19 including hospitalization or death, and for whom other COVID-19 treatment options approved or authorized by the FDA are not accessible or clinically appropriate. The U.S. Food and Drug Administration (FDA) has issued an Emergency  Use Authorization (EUA) to make LAGEVRIO available during the COVID-19 pandemic (for more details about an EUA please see "What is an Emergency Use Authorization?" at the end of this document). LAGEVRIO is not an FDA-approved medicine in the [place]. Read this Fact Sheet for information about LAGEVRIO. Talk to your healthcare provider about your options if you have any questions. It is your choice to take LAGEVRIO.  What is COVID-19? COVID-19 is caused by a virus called a coronavirus. You can get COVID-19 through close contact with another person who has the virus. COVID-19 illnesses have ranged from very mild-to-severe, including illness resulting in death. While information so far suggests that most COVID-19 illness is mild, serious illness can happen and may cause some of your other medical conditions to become worse. Older people and people of all ages with severe, long lasting (chronic) medical conditions like heart disease, lung disease and diabetes, for example seem to be at higher risk of being hospitalized for COVID-19.  What is LAGEVRIO? LAGEVRIO is an investigational medicine used to treat mild-to-moderate COVID-19 in adults: . with positive results of direct SARS-CoV-2 viral testing, and . who are at high risk for progression to severe COVID-19 including hospitalization or death, and for whom other COVID-19 treatment options  approved or authorized by the FDA are not accessible or clinically appropriate. The FDA has authorized the emergency use of LAGEVRIO for the treatment of mild-tomoderate COVID-19 in adults under an EUA. For more information on EUA, see the "What is an Emergency Use Authorization (EUA)?" section at the end of this Fact Sheet. LAGEVRIO is not authorized: . for use in people less than 93 years of age. . for prevention of COVID-19. . for people needing hospitalization for COVID-19. . for use for longer than 5 consecutive days.  What should I tell my  healthcare provider before I take LAGEVRIO? Tell your healthcare provider if you: . Have any allergies . Are breastfeeding or plan to breastfeed . Have any serious illnesses . Are taking any medicines (prescription, over-the-counter, vitamins, or herbal products).  How do I take LAGEVRIO? Marland Kitchen Take LAGEVRIO exactly as your healthcare provider tells you to take it. . Take 4 capsules of LAGEVRIO every 12 hours (for example, at 8 am and at 8 pm) . Take LAGEVRIO for 5 days. It is important that you complete the full 5 days of treatment with LAGEVRIO. Do not stop taking LAGEVRIO before you complete the full 5 days of treatment, even if you feel better. . Take LAGEVRIO with or without food. . You should stay in isolation for as long as your healthcare provider tells you to. Talk to your healthcare provider if you are not sure about how to properly isolate while you have COVID-19. Marland Kitchen Swallow LAGEVRIO capsules whole. Do not open, break, or crush the capsules. If you cannot swallow capsules whole, tell your healthcare provider. . What to do if you miss a dose: o If it has been less than 10 hours since the missed dose, take it as soon as you remember o If it has been more than 10 hours since the missed dose, skip the missed dose and take your dose at the next scheduled time. . Do not double the dose of LAGEVRIO to make up for a missed dose.  What are the important possible side effects of LAGEVRIO? . See, "What is the most important information I should know about LAGEVRIO?" . Allergic Reactions. Allergic reactions can happen in people taking LAGEVRIO, even after only 1 dose. Stop taking LAGEVRIO and call your healthcare provider right away if you get any of the following symptoms of an allergic reaction: o hives o rapid heartbeat o trouble swallowing or breathing o swelling of the mouth, lips, or face o throat tightness o hoarseness o skin rash The most common side effects of LAGEVRIO  are: . diarrhea . nausea . dizziness These are not all the possible side effects of LAGEVRIO. Not many people have taken LAGEVRIO. Serious and unexpected side effects may happen. This medicine is still being studied, so it is possible that all of the risks are not known at this time.  What other treatment choices are there?  Veklury (remdesivir) is FDA-approved as an intravenous (IV) infusion for the treatment of mildto-moderate [id number]-93 in certain adults and children. Talk with your doctor to see if [place] is appropriate for you. Like LAGEVRIO, FDA may also allow for the emergency use of other medicines to treat people with COVID-19. Go to [email or web address] for more information. It is your choice to be treated or not to be treated with LAGEVRIO. Should you decide not to take it, it will not change your standard medical care.  What if I am breastfeeding? Breastfeeding is not recommended during treatment with LAGEVRIO  and for 4 days after the last dose of LAGEVRIO. If you are breastfeeding or plan to breastfeed, talk to your healthcare provider about your options and specific situation before taking LAGEVRIO.  How do I report side effects with LAGEVRIO? Contact your healthcare provider if you have any side effects that bother you or do not go away. Report side effects to FDA MedWatch at SmoothHits.hu or call 1-800-FDA-1088 (1- 304 498 1256).  How should I store [place]? Marland Kitchen Store LAGEVRIO capsules at room temperature between 9F to 82F (20C to 25C). Marland Kitchen Keep LAGEVRIO and all medicines out of the reach of children and pets. How can I learn more about COVID-19? Marland Kitchen Ask your healthcare provider. . Visit [email or web address] . Contact your local or state public health department. . Call [place] at [phone] (toll free in the [place] . Visit  [email or web address]  What Is an Emergency Use Authorization (EUA)? The [place] FDA has made [place] available under an emergency access mechanism called an Emergency Use Authorization (EUA) The EUA is supported by a Present[place], broadcasting Health and Human Service (HHS) declaration that circumstances exist to justify emergency use of drugs and biological products during the COVID-19 pandemic. LAGEVRIO for the treatment of mild-to-moderate COVID-19 in adults with positive results of direct SARS-CoV-2 viral testing, who are at high risk for progression to severe COVID-19, including hospitalization or death, and for whom alternative COVID-19 treatment options approved or authorized by FDA are not accessible or clinically appropriate, has not undergone the same type of review as an FDA-approved product. In issuing an EUA under the IYJGZ-49 public health emergency, the FDA has determined, among other things, that based on the total amount of scientific evidence available including data from adequate and well-controlled clinical trials, if available, it is reasonable to believe that the product may be effective for diagnosing, treating, or preventing COVID-19, or a serious or life-threatening disease or condition caused by COVID-19; that the known and potential benefits of the product, when used to diagnose, treat, or prevent such disease or condition, outweigh the known and potential risks of such product; and that there are no adequate, approved, and available alternatives.  All of these criteria must be met to allow for the product to be used in the treatment of patients during the COVID-19 pandemic. The EUA for LAGEVRIO is in effect for the duration of the COVID-19 declaration justifying emergency use of LAGEVRIO, unless terminated or revoked (after which LAGEVRIO may no longer be used under the EUA). For patent information: http://rogers.info/ Copyright  2021-2022 [place]  [place], [place] and its affiliates. All rights reserved. [id number] Revised: March 2022

## 2021-03-24 ENCOUNTER — Encounter: Payer: Self-pay | Admitting: Family Medicine

## 2021-03-26 ENCOUNTER — Ambulatory Visit: Payer: Medicare Other | Admitting: Internal Medicine

## 2021-03-26 ENCOUNTER — Encounter: Payer: Self-pay | Admitting: Internal Medicine

## 2021-03-26 ENCOUNTER — Other Ambulatory Visit: Payer: Self-pay

## 2021-03-26 VITALS — BP 144/82 | HR 84 | Ht 62.5 in | Wt 145.0 lb

## 2021-03-26 DIAGNOSIS — E559 Vitamin D deficiency, unspecified: Secondary | ICD-10-CM

## 2021-03-26 DIAGNOSIS — E213 Hyperparathyroidism, unspecified: Secondary | ICD-10-CM | POA: Diagnosis not present

## 2021-03-26 DIAGNOSIS — R5383 Other fatigue: Secondary | ICD-10-CM | POA: Diagnosis not present

## 2021-03-26 DIAGNOSIS — M810 Age-related osteoporosis without current pathological fracture: Secondary | ICD-10-CM | POA: Diagnosis not present

## 2021-03-26 LAB — BASIC METABOLIC PANEL
BUN: 24 mg/dL — ABNORMAL HIGH (ref 6–23)
CO2: 27 mEq/L (ref 19–32)
Calcium: 9.9 mg/dL (ref 8.4–10.5)
Chloride: 102 mEq/L (ref 96–112)
Creatinine, Ser: 0.83 mg/dL (ref 0.40–1.20)
GFR: 66.79 mL/min (ref >60.00)
Glucose, Bld: 90 mg/dL (ref 70–99)
Potassium: 4.1 mEq/L (ref 3.5–5.1)
Sodium: 137 mEq/L (ref 135–145)

## 2021-03-26 LAB — VITAMIN B12: Vitamin B-12: 295 pg/mL (ref 211–911)

## 2021-03-26 LAB — T4, FREE: Free T4: 1 ng/dL (ref 0.60–1.60)

## 2021-03-26 LAB — TSH: TSH: 1.79 u[IU]/mL (ref 0.35–4.50)

## 2021-03-26 LAB — T3, FREE: T3, Free: 3.4 pg/mL (ref 2.3–4.2)

## 2021-03-26 NOTE — Patient Instructions (Addendum)
Please make sure you are getting 1200 mg calcium a day, preferably from the diet. Please stop at the lab.  Please continue 2000 units vitamin D daily.  Please look up OsteoStrong for skeletal loading.  Please get in touch with me in November so I can order a new bone density scan.  Exercise for Strong Bones (from [place]) There are two types of exercises that are important for building and maintaining bone density:  weight-bearing and muscle-strengthening exercises. Weight-bearing Exercises These exercises include activities that make you move against gravity while staying upright. Weight-bearing exercises can be high-impact or low-impact. High-impact weight-bearing exercises help build bones and keep them strong. If you have broken a bone due to osteoporosis or are at risk of breaking a bone, you may need to avoid high-impact exercises. If you're not sure, you should check with your healthcare provider. Examples of high-impact weight-bearing exercises are: Dancing Doing high-impact aerobics Hiking Jogging/running Jumping Rope Stair climbing Tennis Low-impact weight-bearing exercises can also help keep bones strong and are a safe alternative if you cannot do high-impact exercises. Examples of low-impact weight-bearing exercises are: Using elliptical training machines Doing low-impact aerobics Using stair-step machines Fast walking on a treadmill or outside Muscle-Strengthening Exercises These exercises include activities where you move your body, a weight or some other resistance against gravity. They are also known as resistance exercises and include: Lifting weights Using elastic exercise bands Using weight machines Lifting your own body weight Functional movements, such as standing and rising up on your toes Yoga and Pilates can also improve strength, balance and flexibility. However, certain positions may not be safe for people with osteoporosis or those  at increased risk of broken bones. For example, exercises that have you bend forward may increase the chance of breaking a bone in the spine. A physical therapist should be able to help you learn which exercises are safe and appropriate for you. Non-Impact Exercises Non-impact exercises can help you to improve balance, posture and how well you move in everyday activities. These exercises can also help to increase muscle strength and decrease the risk of falls and broken bones. Some of these exercises include: Balance exercises that strengthen your legs and test your balance, such as Tai Chi, can decrease your risk of falls. Posture exercises that improve your posture and reduce rounded or "sloping" shoulders can help you decrease the chance of breaking a bone, especially in the spine. Functional exercises that improve how well you move can help you with everyday activities and decrease your chance of falling and breaking a bone. For example, if you have trouble getting up from a chair or climbing stairs, you should do these activities as exercises. A physical therapist can teach you balance, posture and functional exercises. Starting a New Exercise Program If you haven't exercised regularly for a while, check with your healthcare provider before beginning a new exercise program--particularly if you have health problems such as heart disease, diabetes or high blood pressure. If you're at high risk of breaking a bone, you should work with a physical therapist to develop a safe exercise program. Once you have your healthcare provider's approval, start slowly. If you've already broken bones in the spine because of osteoporosis, be very careful to avoid activities that require reaching down, bending forward, rapid twisting motions, heavy lifting and those that increase your chance of a fall. As you get started, your muscles may feel sore for a day or two after you exercise. If soreness lasts  longer, you may be  working too hard and need to ease up. Exercises should be done in a pain-free range of motion. How Much Exercise Do You Need? Weight-bearing exercises 30 minutes on most days of the week. Do a 30-minutesession or multiple sessions spread out throughout the day. The benefits to your bones are the same.   Muscle-strengthening exercises Two to three days per week. If you don't have much time for strengthening/resistance training, do small amounts at a time. You can do just one body part each day. For example do arms one day, legs the next and trunk the next. You can also spread these exercises out during your normal day.  Balance, posture and functional exercises Every day or as often as needed. You may want to focus on one area more than the others. If you have fallen or lose your balance, spend time doing balance exercises. If you are getting rounded shoulders, work more on posture exercises. If you have trouble climbing stairs or getting up from the couch, do more functional exercises. You can also perform these exercises at one time or spread them during your day. Work with a phyiscal therapist to learn the right exercises for you.

## 2021-03-26 NOTE — Progress Notes (Signed)
This is patient ID: Victoria [name], female   DOB: 1941/01/10, 80 y.o.   MRN: [id number]   This visit occurred during the SARS-CoV-2 public health emergency.  Safety protocols were in place, including screening questions prior to the visit, additional usage of staff PPE, and extensive cleaning of exam room while observing appropriate contact time as indicated for disinfecting solutions.   HPI  Victoria [name] is an 80 y.o.-year-old female, returning for follow-up for primary hyperparathyroidism, now s/p parathyroidectomy in 2019, also osteoporosis and vitamin D deficiency.  Last visit 1.5 years ago (virtual)  Interim history: No falls or fractures since last visit. She had Covid 19 last month >> resolved. She describes dry skin, fatigue, cold intolerance.  She would like to see if her thyroid is functioning well. She moved into [facility] since last visit.   Reviewed history: She has had hypercalcemia since at least 2015.  Reviewed pertinent labs: Postop labs: PTH 53, calcium 9.7 Lab Results  Component Value Date   PTH 32 10/23/2018   PTH Comment 10/23/2018   PTH 71 (H) 06/27/2018   PTH 135 (H) 05/11/2018   PTH 62.4 04/07/2009   CALCIUM 9.7 08/18/2020   CALCIUM 9.5 12/24/2019   CALCIUM 9.5 09/20/2019   CALCIUM 9.6 04/23/2019   CALCIUM 9.9 10/23/2018   CALCIUM 10.6 (H) 08/06/2018   CALCIUM 11.0 (H) 06/27/2018   CALCIUM 10.8 (H) 05/11/2018   CALCIUM 11.0 (H) 05/11/2018   CALCIUM 10.7 (H) 05/03/2018  03/25/2011: Calcium 10.1 (8.6-10.2) 07/22/2010, calcium 10 (8.6-10.2)  She had a high calcitriol, normal phosphorus and magnesium Component     Latest Ref Rng & Units 06/27/2018          Vitamin D 1, 25 (OH) Total     18 - 72 pg/mL 84 (H)  Vitamin D3 1, 25 (OH)     pg/mL 84  Vitamin D2 1, 25 (OH)     pg/mL <8  Phosphorus     2.3 - 4.6 mg/dL 2.9  Magnesium     1.5 - 2.5 mg/dL 2.3   Urine calcium level was elevated: Component     Latest Ref Rng & Units 07/04/2018           Creatinine, 24H Ur     0.50 - 2.15 g/24 h 0.88  Calcium, 24H Urine     mg/24 h 273 (H)   08/14/2018: left inferior parathyroidectomy by Dr. Harlow Asa.  Pathology showed a 0.4 g hypercellular parathyroid, enlarged, measuring 1.2 x 1.2 x 0.5 cm.  Review latest DXA scan reports which show osteoporosis: 08/12/2019 Lumbar spine L1-L4 Femoral neck (FN) 33% distal radius  T-score -2.7 RFN: -3.0 LFN: -3.1 -2.9  Change in BMD from previous DXA test (%)  +0.5%  +0.1% n/a  (*) statistically significant  08/07/2017 Lumbar spine L1-L4 Femoral neck (FN)  T-score -2.7 RFN: -3.1 LFN: -3.1  Change in BMD from previous DXA test from 2015 (%) -3.4%* -7.9%*  (*) statistically significant  She was on Fosamax in 1990's- early 2000s, for ~10 years. She tolerated this well.  She is doing weightbearing exercises 2-3 times a week.  She will also start swimming again soon.  No history of kidney stones.  No CKD.  Reviewed her latest BUN/creatinine: Lab Results  Component Value Date   BUN 14 08/18/2020   BUN 20 12/24/2019   CREATININE 0.92 08/18/2020   CREATININE 0.84 12/24/2019   She has a history of vitamin D deficiency but recent vitamin D levels were  normal: Lab Results  Component Value Date   VD25OH 41.42 09/20/2019   VD25OH 43.92 10/23/2018   VD25OH 40.03 05/11/2018   VD25OH 36 01/23/2014   VD25OH 39 03/12/2012   VD25OH 39 03/07/2011   VD25OH 32 04/07/2009  07/30/2010: Vitamin D 27.2  She continues on vitamin D 2000 units daily.  Pt does not have a FH of hypercalcemia, pituitary tumors, thyroid cancer, or osteoporosis.   She also has a history of controlled hypothyroidism, on levothyroxine.  TSH has been normal: Lab Results  Component Value Date   TSH 0.66 08/18/2020   TSH 0.78 12/24/2019   TSH 0.64 06/05/2019   TSH 2.040 05/03/2018   TSH 0.88 07/05/2017   She has CAD and had an AMI 2009.  ROS: + See HPI  Past Medical History:  Diagnosis Date   Acute lumbar  radiculopathy 04/2012   Anemia    history of   Anxiety    Atopic eczema    CAD (coronary artery disease)    Complication of anesthesia    Depression    Dyspnea    with activity   Essential hypertension, benign    Hyperlipidemia, statin intolerant    Hypothyroidism    Myocardial infarction [facility]) 2009   NSTEMI (non-ST elevated myocardial infarction) ([place]) 2009   Osteoporosis, s/p bisphophonates x 7 years, stopped in 2011    Pneumonia    PONV (postoperative nausea and vomiting)    Sacrum and coccyx fracture [facility])    Past Surgical History:  Procedure Laterality Date   APPENDECTOMY     CARDIAC CATHETERIZATION  2009   CARPAL TUNNEL RELEASE Bilateral    cateract syrgery Bilateral 10/2018 and 11/2018   HIP SURGERY  1980s   due to infection    LAPAROSCOPIC HYSTERECTOMY     LEFT HEART CATHETERIZATION WITH CORONARY ANGIOGRAM N/A 11/18/2013   Procedure: LEFT HEART CATHETERIZATION WITH CORONARY ANGIOGRAM;  Surgeon: Blane Ohara, MD;  [facility] C[facility] LAB;  Service: Cardiovascular;  Laterality: N/A;   Left interior parathyroiectomy     Dr. Harlow Asa 08-14-18   LUMBAR LAMINECTOMY/DECOMPRESSION MICRODISCECTOMY  04/17/2012   Procedure: LUMBAR LAMINECTOMY/DECOMPRESSION MICRODISCECTOMY 1 LEVEL;  Surgeon: Erline Levine, MD;  Location: [facility] NEURO ORS;  Service: Neurosurgery;  Laterality: Left;  Left Lumbar five-Sacral one Microdiskectomy   PARATHYROIDECTOMY Left 08/14/2018   Procedure: LEFT INFERIOR PARATHYROIDECTOMY;  Surgeon: Armandina Gemma, MD;  Location: [facility] ORS;  Service: General;  Laterality: Left;   SPINE SURGERY  03/2018   Dr Vertell Limber   TONSILLECTOMY     TOOTH EXTRACTION     tooth implant     Social History   Socioeconomic History   Marital status: Married    Spouse name: Not on file   Number of children: 2   Years of education: Not on file   Highest education level: Not on file  Occupational History   Occupation: retired  Tobacco Use   Smoking status: Never   Smokeless tobacco:  Never  Vaping Use   Vaping Use: Never used  Substance and Sexual Activity   Alcohol use: Yes    Alcohol/week: 1.0 standard drink    Types: 1 Glasses of wine per week    Comment: occasional glass of wine   Drug use: No   Sexual activity: Not Currently  Other Topics Concern   Not on file  Social History Narrative   Not on file   Social Determinants of Health   Financial Resource Strain: Not on file  Food Insecurity: Not on  file  Transportation Needs: Not on file  Physical Activity: Not on file  Stress: Not on file  Social Connections: Not on file  Intimate Partner Violence: Not on file   Current Outpatient Medications on File Prior to Visit  Medication Sig Dispense Refill   amLODipine (NORVASC) 5 MG tablet Take 1 tablet (5 mg total) by mouth daily. 90 tablet 3   aspirin 81 MG tablet Take 81 mg by mouth daily.       atorvastatin (LIPITOR) 10 MG tablet Take 1 tablet (10 mg total) by mouth 3 (three) times a week. 36 tablet 3   Cholecalciferol (VITAMIN D) 2000 UNITS CAPS Take 1 capsule (2,000 Units total) by mouth daily. 30 capsule    clobetasol (TEMOVATE) 0.05 % external solution Apply 1 application topically daily as needed. to scalp. 50 mL 1   Coenzyme Q10 (CO Q-10) 300 MG CAPS Take 300 mg by mouth daily.      Desoximetasone (TOPICORT) 0.25 % ointment Apply 1 application topically 2 (two) times daily as needed (itching). Apply to hands.      EPINEPHrine 0.3 mg/0.3 mL IJ SOAJ injection Inject 0.3 mg into the muscle Once PRN.     ezetimibe (ZETIA) 10 MG tablet Take 1 tablet (10 mg total) by mouth daily. 90 tablet 3   ketoconazole (NIZORAL) 2 % shampoo Apply 1 application topically 2 (two) times a week. 120 mL 1   levothyroxine (SYNTHROID) 75 MCG tablet Take 1 tablet (75 mcg total) by mouth daily. 90 tablet 3   loratadine (CLARITIN) 10 MG tablet Take 10 mg by mouth daily as needed for allergies.     Multiple Vitamins-Minerals (EYE VITAMINS) TABS Take 1 tablet by mouth 2 (two) times  daily.      nitroGLYCERIN (NITROSTAT) 0.4 MG SL tablet PLACE 1 TABLET UNDER THE                 TONGUE AND ALLOW TO                      DISSOLVE EVERY 5 MINUTES AS              NEEDED FOR CHEST PAIN AS DIRECTED BY YOUR DOCTOR 25 tablet 3   No current facility-administered medications on file prior to visit.   Allergies  Allergen Reactions   Shellfish Allergy Hives and Shortness Of Breath   Codeine Other (See Comments)    Headache   Imdur [Isosorbide Nitrate] Other (See Comments)    Confusion   Lanolin Itching   Latex Other (See Comments)    Unknown, allergy testing result   Pollen Extract    Statins     Simvastatin, Lipitor (both caused muscle aches); Crestor 5 mg and 10 mg daily (memory impairment), Livalo (throat and mouth itching), pravastatin 10 mg qd (memory)   Cephalexin Itching and Rash   Ciprofloxacin Rash   Clindamycin/Lincomycin Rash   Erythromycin Itching and Rash   Penicillins Itching and Rash   Propylene Glycol Itching and Rash   Sulfa Antibiotics Itching and Rash   Tetracycline Itching and Rash   Family History  Problem Relation Age of Onset   Colon cancer Mother 38       1st degree   Stroke Father 9   Stroke Other        Grandfather- grandmother    PE: BP (!) 144/82   Pulse 84   Ht 5' 2.5" (1.588 m)   Wt 145 lb (65.8 kg)  SpO2 99%   BMI 26.10 kg/m  Wt Readings from Last 3 Encounters:  03/26/21 145 lb (65.8 kg)  08/18/20 143 lb 12.8 oz (65.2 kg)  07/14/20 141 lb 12.8 oz (64.3 kg)   Constitutional: normal weight, in NAD Eyes: PERRLA, EOMI, no exophthalmos ENT: moist mucous membranes, no thyromegaly, no cervical lymphadenopathy Cardiovascular: RRR, No MRG Respiratory: CTA B Gastrointestinal: abdomen soft, NT, ND, BS+ Musculoskeletal: no deformities, strength intact in all 4 Skin: moist, warm, no rashes Neurological: no tremor with outstretched hands, DTR normal in all 4   Assessment: 1. Hypercalcemia/hyperparathyroidism  2.  History of  vitamin D deficiency  3.  Osteoporosis  4.  Fatigue  Plan: Patient with history of several instances of elevated calcium, with the highest level being at 11.  Intact PTH was also high, at 135 for a corresponding calcium of 10.8.  Her vitamin D level was normal, as was her magnesium and phosphorus.  Calcitriol level was high and 24-hour urine calcium was also high.  She does not have a history of nephrolithiasis, but does have osteoporosis.  At last visit, she denied fractures, abdominal pain, depression, bone pain.  Suspicion was high for primary hyperparathyroidism and I referred her to surgery.  She had right inferior parathyroidectomy by Dr. Harlow Asa on 08/14/2018. -Her calcium and PTH levels decreased after surgery -Latest calcium level was reviewed and this was normal in 08/2020 Lab Results  Component Value Date   CALCIUM 9.7 08/18/2020   PHOS 2.9 06/27/2018  -She is feeling well, without complaints -We will recheck her calcium today.  2.  History of vitamin D deficiency -Her most recent vitamin D level was normal in 09/2019 Lab Results  Component Value Date   VD25OH 41.42 09/20/2019  -She continues on 2000 units vitamin D daily -We will recheck her level today  3.  Osteoporosis -Patient has a history of osteoporosis for which I saw her in the past.  However, she returns after hiatus of 1.5 years, after we had a virtual visit. -No falls or fractures since last visit -Reviewing her most recent bone density scan from 08/12/2019, it appears that the T-scores were lower than those from 2015, but stable from 2018.  However, after the latest bone density, she started weightbearing exercises in an effort to improve her T-scores.  We discussed at last visit that after her parathyroidectomy her bone density may improve.  She wanted to wait for the new report before deciding for antiresorptive or anabolic medication for her osteoporosis.  We did discuss at last visit about different options for  treatment, expected effects (decreased fracture risk by approximately 50%) and side effects (including ONJ and atypical fractures) and I explained that my suggestion for her would be either Prolia or Reclast.  I sent her information about these.  She reviewed them but she was afraid of side effects.  At this visit, we discussed about the very advantageous benefit/risk ratio in case we need to start this. -She continues on vitamin D 2000 units daily -She continues exercise and given again a list of weightbearing exercises for her to review -Plan to repeat another bone density scan towards the end of this year and decide about possible treatment then -I will see the patient back in a year  4.  Fatigue -She describes fatigue, cold intolerance, dry skin -She would like to make sure that her thyroid tests are normal.  We will check a TSH, free T4, free T3 today -We will also add a  vitamin D and B12 -I also suggested fish oil 1000 mg daily for her dry skin but I did recommend to check with cardiology since this may have a blood thinning effect  Component     Latest Ref Rng & Units 03/26/2021          Sodium     135 - 145 mEq/L 137  Potassium     3.5 - 5.1 mEq/L 4.1  Chloride     96 - 112 mEq/L 102  CO2     19 - 32 mEq/L 27  Glucose     70 - 99 mg/dL 90  BUN     6 - 23 mg/dL 24 (H)  Creatinine     0.40 - 1.20 mg/dL 0.83  Calcium     8.4 - 10.5 mg/dL 9.9  GFR     >60.00 mL/min 66.79  TSH     0.35 - 4.50 uIU/mL 1.79  T4,Free(Direct)     0.60 - 1.60 ng/dL 1.00  Triiodothyronine,Free,Serum     2.3 - 4.2 pg/mL 3.4  Vitamin D, 25-Hydroxy     30.0 - 100.0 ng/mL 43.3  Vitamin B12     211 - 911 pg/mL 295  Kidney function, electrolytes, TFTs, and vitamin D levels were normal.  Vitamin B12 is also normal, but was the lower limit of normal so I would suggest to add 500 mcg daily of B12.  Philemon Kingdom, MD PhD [facility] Endocr[facility]ology

## 2021-03-27 LAB — VITAMIN D 25 HYDROXY (VIT D DEFICIENCY, FRACTURES): Vit D, 25-Hydroxy: 43.3 ng/mL (ref 30.0–100.0)

## 2021-04-01 ENCOUNTER — Other Ambulatory Visit: Payer: Self-pay

## 2021-04-01 DIAGNOSIS — I10 Essential (primary) hypertension: Secondary | ICD-10-CM

## 2021-04-01 MED ORDER — AMLODIPINE BESYLATE 5 MG PO TABS: 5.0000 mg | ORAL_TABLET | Freq: Every day | ORAL | 0 refills | Status: DC

## 2021-04-01 NOTE — Addendum Note (Signed)
Addended by: Carter Kitten D on: 04/01/2021 12:00 PM   Modules accepted: Orders

## 2021-04-07 ENCOUNTER — Other Ambulatory Visit: Payer: Self-pay

## 2021-04-07 MED ORDER — ATORVASTATIN CALCIUM 10 MG PO TABS: 10.0000 mg | ORAL_TABLET | ORAL | 0 refills | Status: DC

## 2021-04-23 ENCOUNTER — Telehealth: Payer: Self-pay | Admitting: Cardiovascular Disease

## 2021-04-23 DIAGNOSIS — I10 Essential (primary) hypertension: Secondary | ICD-10-CM

## 2021-04-23 DIAGNOSIS — I25118 Atherosclerotic heart disease of native coronary artery with other forms of angina pectoris: Secondary | ICD-10-CM

## 2021-04-23 MED ORDER — EZETIMIBE 10 MG PO TABS: 10.0000 mg | ORAL_TABLET | Freq: Every day | ORAL | 3 refills | Status: DC

## 2021-04-23 MED ORDER — NITROGLYCERIN 0.4 MG SL SUBL: SUBLINGUAL_TABLET | SUBLINGUAL | 3 refills | Status: DC

## 2021-04-23 NOTE — Telephone Encounter (Signed)
Notified patient refills were sent to pharmacy.

## 2021-04-23 NOTE — Telephone Encounter (Signed)
*  STAT* If patient is at the pharmacy, call can be transferred to refill team.   1. Which medications need to be refilled? (please list name of each medication and dose if known)  need a new prescription for Nitroglycerin and  Ezetimibe  2. Which pharmacy/location (including street and city if local pharmacy) is medication to be sent to? [facility]   3. Do they need a 30 day or 90 day supply? 90 days and refills

## 2021-04-23 NOTE — Addendum Note (Signed)
Addended by: Hinton Dyer on: 04/23/2021 12:08 PM   Modules accepted: Orders

## 2021-06-27 ENCOUNTER — Other Ambulatory Visit: Payer: Self-pay | Admitting: Cardiovascular Disease

## 2021-06-27 DIAGNOSIS — I10 Essential (primary) hypertension: Secondary | ICD-10-CM

## 2021-07-14 ENCOUNTER — Other Ambulatory Visit (HOSPITAL_COMMUNITY): Payer: Self-pay

## 2021-07-18 ENCOUNTER — Other Ambulatory Visit: Payer: Self-pay | Admitting: Cardiovascular Disease

## 2021-07-18 DIAGNOSIS — I10 Essential (primary) hypertension: Secondary | ICD-10-CM

## 2021-08-07 ENCOUNTER — Other Ambulatory Visit: Payer: Self-pay | Admitting: Cardiovascular Disease

## 2021-08-17 ENCOUNTER — Other Ambulatory Visit: Payer: Self-pay

## 2021-08-17 MED ORDER — ATORVASTATIN CALCIUM 10 MG PO TABS: ORAL_TABLET | ORAL | 0 refills | Status: DC

## 2021-08-17 NOTE — Telephone Encounter (Signed)
Pt's medication was sent to pt's pharmacy as requested. Confirmation received.  °

## 2021-09-06 ENCOUNTER — Other Ambulatory Visit: Payer: Self-pay | Admitting: Internal Medicine

## 2021-09-06 ENCOUNTER — Encounter: Payer: Self-pay | Admitting: Internal Medicine

## 2021-09-06 DIAGNOSIS — E213 Hyperparathyroidism, unspecified: Secondary | ICD-10-CM

## 2021-09-06 DIAGNOSIS — M810 Age-related osteoporosis without current pathological fracture: Secondary | ICD-10-CM

## 2021-09-10 ENCOUNTER — Ambulatory Visit (INDEPENDENT_AMBULATORY_CARE_PROVIDER_SITE_OTHER)
Admission: RE | Admit: 2021-09-10 | Discharge: 2021-09-10 | Disposition: A | Discharge status: Home / Self Care | Payer: Medicare Other | Source: Ambulatory Visit | Attending: Internal Medicine | Admitting: Internal Medicine

## 2021-09-10 ENCOUNTER — Other Ambulatory Visit: Payer: Self-pay

## 2021-09-10 DIAGNOSIS — M810 Age-related osteoporosis without current pathological fracture: Secondary | ICD-10-CM | POA: Diagnosis not present

## 2021-09-10 DIAGNOSIS — E213 Hyperparathyroidism, unspecified: Secondary | ICD-10-CM | POA: Diagnosis not present

## 2021-09-11 ENCOUNTER — Encounter: Payer: Self-pay | Admitting: Internal Medicine

## 2021-09-13 ENCOUNTER — Other Ambulatory Visit: Payer: Self-pay | Admitting: Internal Medicine

## 2021-09-13 MED ORDER — ALENDRONATE SODIUM 70 MG PO TABS: 70.0000 mg | ORAL_TABLET | ORAL | 3 refills | Status: DC

## 2021-09-20 ENCOUNTER — Other Ambulatory Visit: Payer: Self-pay | Admitting: Internal Medicine

## 2021-09-20 MED ORDER — ALENDRONATE SODIUM 70 MG PO TABS: 70.0000 mg | ORAL_TABLET | ORAL | 3 refills | Status: DC

## 2021-10-19 ENCOUNTER — Other Ambulatory Visit: Payer: Self-pay

## 2021-10-19 ENCOUNTER — Encounter: Payer: Self-pay | Admitting: Cardiovascular Disease

## 2021-10-19 ENCOUNTER — Ambulatory Visit: Payer: Medicare Other | Admitting: Cardiovascular Disease

## 2021-10-19 VITALS — BP 120/70 | HR 84 | Ht 63.5 in | Wt 147.2 lb

## 2021-10-19 DIAGNOSIS — I10 Essential (primary) hypertension: Secondary | ICD-10-CM

## 2021-10-19 DIAGNOSIS — I25118 Atherosclerotic heart disease of native coronary artery with other forms of angina pectoris: Secondary | ICD-10-CM

## 2021-10-19 DIAGNOSIS — E782 Mixed hyperlipidemia: Secondary | ICD-10-CM | POA: Diagnosis not present

## 2021-10-19 DIAGNOSIS — I25119 Atherosclerotic heart disease of native coronary artery with unspecified angina pectoris: Secondary | ICD-10-CM

## 2021-10-19 DIAGNOSIS — R0609 Other forms of dyspnea: Secondary | ICD-10-CM

## 2021-10-19 MED ORDER — ATORVASTATIN CALCIUM 10 MG PO TABS: ORAL_TABLET | ORAL | 3 refills | Status: DC

## 2021-10-19 MED ORDER — EZETIMIBE 10 MG PO TABS: 10.0000 mg | ORAL_TABLET | Freq: Every day | ORAL | 3 refills | Status: DC

## 2021-10-19 MED ORDER — NITROGLYCERIN 0.4 MG SL SUBL: SUBLINGUAL_TABLET | SUBLINGUAL | 3 refills | Status: DC

## 2021-10-19 MED ORDER — AMLODIPINE BESYLATE 5 MG PO TABS: 5.0000 mg | ORAL_TABLET | Freq: Every day | ORAL | 3 refills | Status: DC

## 2021-10-19 NOTE — Patient Instructions (Signed)
Medication Instructions:  Refills sent to mail order pharmacy *If you need a refill on your cardiac medications before your next appointment, please call your pharmacy*   Lab Work: CBC, Complete metabolic panel, Lipids (soon) If you have labs (blood work) drawn today and your tests are completely normal, you will receive your results only by: [place] (if you have MyChart) OR A paper copy in the mail If you have any lab test that is abnormal or we need to change your treatment, we will call you to review the results.   Testing/Procedures: ECHO Your physician has requested that you have an echocardiogram. Echocardiography is a painless test that uses sound waves to create images of your heart. It provides your doctor with information about the size and shape of your heart and how well your hearts chambers and valves are working. This procedure takes approximately one hour. There are no restrictions for this procedure.    Follow-Up: At [facility], you and your health needs are our priority.  As part of our continuing mission to provide you with exceptional heart care, we have created designated Provider Care Teams.  These Care Teams include your primary Cardiologist (physician) and Advanced Practice Providers (APPs -  Physician Assistants and Nurse Practitioners) who all work together to provide you with the care you need, when you need it.   Your next appointment:   1 year(s)  The format for your next appointment:   In Person  Provider:   [name], MD

## 2021-10-19 NOTE — Progress Notes (Signed)
Cardiology Office Note:    Date:  10/20/2021   ID:  Victoria [name], DOB [birth year], MRN [id number]  PCP:  Leamon Arnt, MD   [facility] Providers Cardiologist:  [name], MD     Referring MD: Leamon Arnt, MD   Chief Complaint  Patient presents with   Coronary Artery Disease    History of Present Illness:    Victoria [name] is a 81 y.o. female with a hx of coronary artery disease who initially presented in 2009 with non-ST elevation MI treated with balloon angioplasty of a small circumflex branch.  In 2016 she developed progressive angina and underwent repeat catheterization with demonstration of moderate diffuse mid LAD stenosis and borderline FFR analysis.  Medical therapy was recommended.  She had a nuclear scan in May 2019 demonstrating no ischemia and normal LVEF of 75%. She has had chronic angina over the years.   The patient is here alone today. She has had some fatigue since contracting Covid-19 infection in May 2022. Since the holidays, she has had 2 episodes of chest pain requiring NTG. Each episode was quickly relieved with NTG. She has not had any exertional chest discomfort, but does admit to shortness of breath with activities such as making her bed.  She denies lightheadedness, heart palpitations, orthopnea, or PND.  The patient is compliant with her medical program.  She takes a daily aspirin.  Past Medical History:  Diagnosis Date   Acute lumbar radiculopathy 04/2012   Anemia    history of   Anxiety    Atopic eczema    CAD (coronary artery disease)    Complication of anesthesia    Depression    Dyspnea    with activity   Essential hypertension, benign    Hyperlipidemia, statin intolerant    Hypothyroidism    Myocardial infarction [facility]) 2009   NSTEMI (non-ST elevated myocardial infarction) ([place]) 2009   Osteoporosis, s/p bisphophonates x 7 years, stopped in 2011    Pneumonia    PONV (postoperative nausea and vomiting)    Sacrum and  coccyx fracture [facility])     Past Surgical History:  Procedure Laterality Date   APPENDECTOMY     CARDIAC CATHETERIZATION  2009   CARPAL TUNNEL RELEASE Bilateral    cateract syrgery Bilateral 10/2018 and 11/2018   HIP SURGERY  1980s   due to infection    LAPAROSCOPIC HYSTERECTOMY     LEFT HEART CATHETERIZATION WITH CORONARY ANGIOGRAM N/A 11/18/2013   Procedure: LEFT HEART CATHETERIZATION WITH CORONARY ANGIOGRAM;  Surgeon: Blane Ohara, MD;  Location: [facility] CATH LAB;  Service: Cardiovascular;  Laterality: N/A;   Left interior parathyroiectomy     Dr. Harlow Asa 08-14-18   LUMBAR LAMINECTOMY/DECOMPRESSION MICRODISCECTOMY  04/17/2012   Procedure: LUMBAR LAMINECTOMY/DECOMPRESSION MICRODISCECTOMY 1 LEVEL;  Surgeon: Erline Levine, MD;  Location: [place] NEURO ORS;  Service: Neurosurgery;  Laterality: Left;  Left Lumbar five-Sacral one Microdiskectomy   PARATHYROIDECTOMY Left 08/14/2018   Procedure: LEFT INFERIOR PARATHYROIDECTOMY;  Surgeon: Armandina Gemma, MD;  Location: [facility] ORS;  Service: General;  Laterality: Left;   SPINE SURGERY  03/2018   Dr Vertell Limber   TONSILLECTOMY     TOOTH EXTRACTION     tooth implant      Current Medications: Current Meds  Medication Sig   alendronate (FOSAMAX) 70 MG tablet Take 1 tablet (70 mg total) by mouth every 7 (seven) days. Take with a full glass of water on an empty stomach.   aspirin 81 MG tablet Take  81 mg by mouth daily.     Cholecalciferol (VITAMIN D) 2000 UNITS CAPS Take 1 capsule (2,000 Units total) by mouth daily.   clobetasol (TEMOVATE) 0.05 % external solution Apply 1 application topically daily as needed. to scalp.   Coenzyme Q10 (CO Q-10) 300 MG CAPS Take 300 mg by mouth daily.    Desoximetasone (TOPICORT) 0.25 % ointment Apply 1 application topically 2 (two) times daily as needed (itching). Apply to hands.    EPINEPHrine 0.3 mg/0.3 mL IJ SOAJ injection Inject 0.3 mg into the muscle Once PRN.   levothyroxine (SYNTHROID) 75 MCG tablet Take 1 tablet (75 mcg  total) by mouth daily.   loratadine (CLARITIN) 10 MG tablet Take 10 mg by mouth daily as needed for allergies.   Multiple Vitamins-Minerals (EYE VITAMINS) TABS Take 1 tablet by mouth 2 (two) times daily.    [DISCONTINUED] amLODipine (NORVASC) 5 MG tablet Take 1 tablet (5 mg total) by mouth daily. Pt needs to keep upcoming appt in Jan, 2023 for further refills   [DISCONTINUED] atorvastatin (LIPITOR) 10 MG tablet TAKE 1 TABLET THREE TIMES  WEEKLY. Please keep upcoming appt in January 2023 with Dr. Burt Knack before anymore refills. Thank you Final Attempt   [DISCONTINUED] ezetimibe (ZETIA) 10 MG tablet Take 1 tablet (10 mg total) by mouth daily.   [DISCONTINUED] nitroGLYCERIN (NITROSTAT) 0.4 MG SL tablet PLACE 1 TABLET UNDER THE                 TONGUE AND ALLOW TO                      DISSOLVE EVERY 5 MINUTES AS              NEEDED FOR CHEST PAIN AS DIRECTED BY YOUR DOCTOR     Allergies:   Shellfish allergy, Codeine, Imdur [isosorbide nitrate], Lanolin, Latex, Pollen extract, Statins, Cephalexin, Ciprofloxacin, Clindamycin/lincomycin, Erythromycin, Penicillins, Propylene glycol, Sulfa antibiotics, and Tetracycline   Social History   Socioeconomic History   Marital status: Married    Spouse name: Not on file   Number of children: 2   Years of education: Not on file   Highest education level: Not on file  Occupational History   Occupation: retired  Tobacco Use   Smoking status: Never   Smokeless tobacco: Never  Vaping Use   Vaping Use: Never used  Substance and Sexual Activity   Alcohol use: Yes    Alcohol/week: 1.0 standard drink    Types: 1 Glasses of wine per week    Comment: occasional glass of wine   Drug use: No   Sexual activity: Not Currently  Other Topics Concern   Not on file  Social History Narrative   Not on file   Social Determinants of Health   Financial Resource Strain: Not on file  Food Insecurity: Not on file  Transportation Needs: Not on file  Physical Activity:  Not on file  Stress: Not on file  Social Connections: Not on file     Family History: The patient's family history includes Colon cancer (age of onset: 34) in her mother; Stroke in an other family member; Stroke (age of onset: 43) in her father.  ROS:   Please see the history of present illness.    All other systems reviewed and are negative.  EKGs/Labs/Other Studies Reviewed:    The following studies were reviewed today: Myoview Scan 02/20/2018: Nuclear stress EF: 75%. There was no ST segment deviation noted during stress.  The left ventricular ejection fraction is hyperdynamic (>65%).   Poor quality study with diffusely low counts in apex and inferior wall on all images No evidence of ischemia EF 75%   EKG:  EKG is ordered today.  The ekg ordered today demonstrates NSR 84 bpm, LAD, otherwise within normal limits  Recent Labs: 03/26/2021: BUN 24; Creatinine, Ser 0.83; Potassium 4.1; Sodium 137; TSH 1.79  Recent Lipid Panel    Component Value Date/Time   CHOL 156 08/18/2020 1029   CHOL 144 04/23/2019 0916   TRIG 99 08/18/2020 1029   HDL 53 08/18/2020 1029   HDL 50 04/23/2019 0916   CHOLHDL 2.9 08/18/2020 1029   VLDL 18.8 12/24/2019 0856   LDLCALC 84 08/18/2020 1029     Risk Assessment/Calculations:           Physical Exam:    VS:  BP 120/70    Pulse 84    Ht 5' 3.5" (1.613 m)    Wt 147 lb 3.2 oz (66.8 kg)    SpO2 97%    BMI 25.67 kg/m     Wt Readings from Last 3 Encounters:  10/19/21 147 lb 3.2 oz (66.8 kg)  03/26/21 145 lb (65.8 kg)  08/18/20 143 lb 12.8 oz (65.2 kg)     GEN:  Well nourished, well developed in no acute distress HEENT: Normal NECK: No JVD; No carotid bruits LYMPHATICS: No lymphadenopathy CARDIAC: RRR, no murmurs, rubs, gallops RESPIRATORY:  Clear to auscultation without rales, wheezing or rhonchi  ABDOMEN: Soft, non-tender, non-distended MUSCULOSKELETAL:  No edema; No deformity  SKIN: Warm and dry NEUROLOGIC:  Alert and oriented x  3 PSYCHIATRIC:  Normal affect   ASSESSMENT:    1. Coronary artery disease involving native coronary artery of native heart with angina pectoris ([place])   2. Essential hypertension   3. Mixed hyperlipidemia   4. Coronary artery disease of native artery of native heart with stable angina pectoris ([place])   5. Exertional dyspnea    PLAN:    In order of problems listed above:  Stable with episodic symptoms of angina.  Interestingly, she does not have any typical angina with exertion.  She remains on aspirin, atorvastatin, and amlodipine.  She has been intolerant to isosorbide.  We discussed stress testing, as its been several years since she has had any ischemic evaluation.  She has consistently declined stress testing over the past few years.  We will continue to manage her medically at her request. Blood pressure well controlled on amlodipine.  Continue the same. Treated with atorvastatin, dose-limiting side effects.  Also on Zetia 10 mg daily.  We will update lipids.  I do not think we will be able to escalate her lipid-lowering therapies based on history of drug intolerances. As above I have recommended a 2D echocardiogram to assess LV systolic and diastolic function and evaluate for other cardiac causes of shortness of breath.     Medication Adjustments/Labs and Tests Ordered: Current medicines are reviewed at length with the patient today.  Concerns regarding medicines are outlined above.  Orders Placed This Encounter  Procedures   CBC   Comprehensive metabolic panel   Lipid panel   EKG 12-Lead   ECHOCARDIOGRAM COMPLETE   Meds ordered this encounter  Medications   ezetimibe (ZETIA) 10 MG tablet    Sig: Take 1 tablet (10 mg total) by mouth daily.    Dispense:  90 tablet    Refill:  3   amLODipine (NORVASC) 5 MG tablet  Sig: Take 1 tablet (5 mg total) by mouth daily.    Dispense:  90 tablet    Refill:  3   nitroGLYCERIN (NITROSTAT) 0.4 MG SL tablet    Sig: PLACE 1 TABLET  UNDER THE TONGUE AND ALLOW TO DISSOLVE EVERY 5 MINUTES AS NEEDED FOR CHEST PAIN AS DIRECTED BY YOUR DOCTOR.    Dispense:  25 tablet    Refill:  3   atorvastatin (LIPITOR) 10 MG tablet    Sig: TAKE 1 TABLET THREE TIMES  WEEKLY    Dispense:  36 tablet    Refill:  3    Patient Instructions  Medication Instructions:  Refills sent to mail order pharmacy *If you need a refill on your cardiac medications before your next appointment, please call your pharmacy*   Lab Work: CBC, Complete metabolic panel, Lipids (soon) If you have labs (blood work) drawn today and your tests are completely normal, you will receive your results only by: MyChart Message (if you have MyChart) OR A paper copy in the mail If you have any lab test that is abnormal or we need to change your treatment, we will call you to review the results.   Testing/Procedures: ECHO Your physician has requested that you have an echocardiogram. Echocardiography is a painless test that uses sound waves to create images of your heart. It provides your doctor with information about the size and shape of your heart and how well your hearts chambers and valves are working. This procedure takes approximately one hour. There are no restrictions for this procedure.    Follow-Up: At [facility], you and your health needs are our priority.  As part of our continuing mission to provide you with exceptional heart care, we have created designated Provider Care Teams.  These Care Teams include your primary Cardiologist (physician) and Advanced Practice Providers (APPs -  Physician Assistants and Nurse Practitioners) who all work together to provide you with the care you need, when you need it.   Your next appointment:   1 year(s)  The format for your next appointment:   In Person  Provider:   [name], MD         Signed, [name], MD  10/20/2021 7:43 AM    [place]

## 2021-10-20 ENCOUNTER — Encounter: Payer: Self-pay | Admitting: Cardiovascular Disease

## 2021-10-22 ENCOUNTER — Other Ambulatory Visit: Payer: Medicare Other

## 2021-11-01 ENCOUNTER — Ambulatory Visit (HOSPITAL_COMMUNITY): Payer: Medicare Other | Attending: Internal Medicine

## 2021-11-01 ENCOUNTER — Other Ambulatory Visit: Payer: Medicare Other | Admitting: *Deleted

## 2021-11-01 ENCOUNTER — Other Ambulatory Visit: Payer: Self-pay

## 2021-11-01 DIAGNOSIS — I25119 Atherosclerotic heart disease of native coronary artery with unspecified angina pectoris: Secondary | ICD-10-CM

## 2021-11-01 DIAGNOSIS — I10 Essential (primary) hypertension: Secondary | ICD-10-CM

## 2021-11-01 DIAGNOSIS — I25118 Atherosclerotic heart disease of native coronary artery with other forms of angina pectoris: Secondary | ICD-10-CM | POA: Diagnosis not present

## 2021-11-01 DIAGNOSIS — R0609 Other forms of dyspnea: Secondary | ICD-10-CM | POA: Insufficient documentation

## 2021-11-01 DIAGNOSIS — E782 Mixed hyperlipidemia: Secondary | ICD-10-CM

## 2021-11-01 LAB — COMPREHENSIVE METABOLIC PANEL
ALT: 18 IU/L (ref 0–32)
AST: 20 IU/L (ref 0–40)
Albumin/Globulin Ratio: 1.5 (ref 1.2–2.2)
Albumin: 4.5 g/dL (ref 3.7–4.7)
Alkaline Phosphatase: 73 IU/L (ref 44–121)
BUN/Creatinine Ratio: 19 (ref 12–28)
BUN: 16 mg/dL (ref 8–27)
Bilirubin Total: 0.5 mg/dL (ref 0.0–1.2)
CO2: 24 mmol/L (ref 20–29)
Calcium: 9.5 mg/dL (ref 8.7–10.3)
Chloride: 104 mmol/L (ref 96–106)
Creatinine, Ser: 0.86 mg/dL (ref 0.57–1.00)
Globulin, Total: 3 g/dL (ref 1.5–4.5)
Glucose: 87 mg/dL (ref 70–99)
Potassium: 4.4 mmol/L (ref 3.5–5.2)
Sodium: 142 mmol/L (ref 134–144)
Total Protein: 7.5 g/dL (ref 6.0–8.5)
eGFR: 68 mL/min/{1.73_m2} (ref >59)

## 2021-11-01 LAB — CBC
Hematocrit: 41.6 % (ref 34.0–46.6)
Hemoglobin: 14.1 g/dL (ref 11.1–15.9)
MCH: 31.5 pg (ref 26.6–33.0)
MCHC: 33.9 g/dL (ref 31.5–35.7)
MCV: 93 fL (ref 79–97)
Platelets: 264 10*3/uL (ref 150–450)
RBC: 4.47 x10E6/uL (ref 3.77–5.28)
RDW: 12 % (ref 11.7–15.4)
WBC: 6.4 10*3/uL (ref 3.4–10.8)

## 2021-11-01 LAB — LIPID PANEL
Chol/HDL Ratio: 3.2 ratio (ref 0.0–4.4)
Cholesterol, Total: 165 mg/dL (ref 100–199)
HDL: 52 mg/dL (ref >39)
LDL Chol Calc (NIH): 97 mg/dL (ref 0–99)
Triglycerides: 85 mg/dL (ref 0–149)
VLDL Cholesterol Cal: 16 mg/dL (ref 5–40)

## 2021-11-01 LAB — ECHOCARDIOGRAM COMPLETE
Area-P 1/2: 2.47 cm2
S' Lateral: 2.4 cm

## 2021-11-03 ENCOUNTER — Other Ambulatory Visit: Payer: Self-pay | Admitting: Internal Medicine

## 2021-11-03 ENCOUNTER — Encounter: Payer: Self-pay | Admitting: Internal Medicine

## 2021-11-03 DIAGNOSIS — M810 Age-related osteoporosis without current pathological fracture: Secondary | ICD-10-CM

## 2021-12-13 ENCOUNTER — Other Ambulatory Visit: Payer: Self-pay | Admitting: Family Medicine

## 2021-12-13 DIAGNOSIS — E039 Hypothyroidism, unspecified: Secondary | ICD-10-CM

## 2022-01-24 ENCOUNTER — Other Ambulatory Visit: Payer: Self-pay | Admitting: Family Medicine

## 2022-01-24 DIAGNOSIS — E039 Hypothyroidism, unspecified: Secondary | ICD-10-CM

## 2022-02-11 ENCOUNTER — Encounter: Payer: Self-pay | Admitting: Family Medicine

## 2022-02-11 ENCOUNTER — Ambulatory Visit: Payer: Medicare Other | Admitting: Family Medicine

## 2022-02-11 VITALS — BP 110/60 | HR 76 | Temp 98.3°F | Ht 63.5 in | Wt 149.0 lb

## 2022-02-11 DIAGNOSIS — E559 Vitamin D deficiency, unspecified: Secondary | ICD-10-CM | POA: Diagnosis not present

## 2022-02-11 DIAGNOSIS — Z23 Encounter for immunization: Secondary | ICD-10-CM

## 2022-02-11 DIAGNOSIS — E039 Hypothyroidism, unspecified: Secondary | ICD-10-CM

## 2022-02-11 DIAGNOSIS — I10 Essential (primary) hypertension: Secondary | ICD-10-CM

## 2022-02-11 LAB — COMPREHENSIVE METABOLIC PANEL
ALT: 16 U/L (ref 0–35)
AST: 16 U/L (ref 0–37)
Albumin: 4.2 g/dL (ref 3.5–5.2)
Alkaline Phosphatase: 48 U/L (ref 39–117)
BUN: 18 mg/dL (ref 6–23)
CO2: 29 mEq/L (ref 19–32)
Calcium: 9.6 mg/dL (ref 8.4–10.5)
Chloride: 104 mEq/L (ref 96–112)
Creatinine, Ser: 0.9 mg/dL (ref 0.40–1.20)
GFR: 60.23 mL/min (ref >60.00)
Glucose, Bld: 61 mg/dL — ABNORMAL LOW (ref 70–99)
Potassium: 3.9 mEq/L (ref 3.5–5.1)
Sodium: 140 mEq/L (ref 135–145)
Total Bilirubin: 0.5 mg/dL (ref 0.2–1.2)
Total Protein: 7.4 g/dL (ref 6.0–8.3)

## 2022-02-11 LAB — TSH: TSH: 1.56 u[IU]/mL (ref 0.35–5.50)

## 2022-02-11 LAB — VITAMIN D 25 HYDROXY (VIT D DEFICIENCY, FRACTURES): VITD: 37.21 ng/mL (ref 30.00–100.00)

## 2022-02-11 MED ORDER — CLOBETASOL PROPIONATE 0.05 % EX SOLN: 1.0000 "application " | Freq: Every day | CUTANEOUS | 1 refills | Status: DC | PRN

## 2022-02-11 NOTE — Patient Instructions (Signed)
Please follow up if symptoms do not improve or as needed.   ? ?I will refill your thyroid medication after your thyroid test returns.  ? ?I will release your lab results to you on your MyChart account with further instructions. You may see the results before I do, but when I review them I will send you a message with my report or have my assistant call you if things need to be discussed. Please reply to my message with any questions. Thank you!  ?

## 2022-02-11 NOTE — Addendum Note (Signed)
Addended by: Zacarias Pontes on: 02/11/2022 09:13 AM ? ? Modules accepted: Orders ? ?

## 2022-02-11 NOTE — Progress Notes (Signed)
? ?Subjective  ?CC:  ?Chief Complaint  ?Patient presents with  ? Medication Refill  ?  Need refill of thyroid medication ?Not fasting ?Would like vitamin d, calcium and TSH checked ?  ? Osteoporosis  ? ? ?HPI: Victoria [name] is a 81 y.o. female who presents to the office today to address the problems listed above in the chief complaint. ?81 year old female last here in 2021.  Follows with cardiology and endocrinology.  Would like her thyroid checked vitamin D and calcium only.  Not interested in preventive or wellness care.  Feels well.  Takes levothyroxine 75 mcg daily.  I reviewed notes from endocrinology in June of last year. ?Assessment  ?1. Acquired hypothyroidism   ?2. Vitamin D deficiency   ?3. Essential hypertension, benign   ? ?  ?Plan  ?Hypothyroidism: Clinically euthyroid.  Check TSH and refill medications. ?Recheck vitamin D ?Recheck renal function and calcium with history of hyperparathyroidism.  Blood pressures controlled. ?Patient declines annual physicals ? ?Follow up: As needed ?Visit date not found ? ?Orders Placed This Encounter  ?Procedures  ? TSH  ? VITAMIN D 25 Hydroxy (Vit-D Deficiency, Fractures)  ? Comprehensive metabolic panel  ? ?Meds ordered this encounter  ?Medications  ? clobetasol (TEMOVATE) 0.05 % external solution  ?  Sig: Apply 1 application. topically daily as needed. to scalp.  ?  Dispense:  50 mL  ?  Refill:  1  ? ?  ? ?I reviewed the patients updated PMH, FH, and SocHx.  ?  ?Patient Active Problem List  ? Diagnosis Date Noted  ? History of primary hyperparathyroidism 06/27/2018  ?  Priority: High  ? Chronic right-sided low back pain with right-sided sciatica 08/15/2017  ?  Priority: High  ? Essential hypertension, benign 05/14/2011  ?  Priority: High  ? CAD (coronary artery disease) 01/19/2009  ?  Priority: High  ? Acquired hypothyroidism 12/04/2008  ?  Priority: High  ? Mixed hyperlipidemia 12/04/2008  ?  Priority: High  ? History of non-ST elevation myocardial infarction  (NSTEMI) 12/04/2008  ?  Priority: High  ? Age-related osteoporosis without fracture 12/04/2008  ?  Priority: Medium   ? Hx of laminectomy L5-1 10/31/2018  ?  Priority: Low  ? Vitamin D deficiency 08/02/2010  ?  Priority: Low  ? Allergic rhinitis 07/30/2010  ?  Priority: Low  ? Multiple drug allergies 06/04/2019  ? ?Current Meds  ?Medication Sig  ? alendronate (FOSAMAX) 70 MG tablet Take 1 tablet (70 mg total) by mouth every 7 (seven) days. Take with a full glass of water on an empty stomach.  ? amLODipine (NORVASC) 5 MG tablet Take 1 tablet (5 mg total) by mouth daily.  ? aspirin 81 MG tablet Take 81 mg by mouth daily.    ? atorvastatin (LIPITOR) 10 MG tablet TAKE 1 TABLET THREE TIMES  WEEKLY  ? Cholecalciferol (VITAMIN D) 2000 UNITS CAPS Take 1 capsule (2,000 Units total) by mouth daily.  ? Coenzyme Q10 (CO Q-10) 300 MG CAPS Take 300 mg by mouth daily.   ? desonide (DESOWEN) 0.05 % ointment Apply 1 application. topically 2 (two) times daily as needed.  ? Desoximetasone (TOPICORT) 0.25 % ointment Apply 1 application topically 2 (two) times daily as needed (itching). Apply to hands.   ? EPINEPHrine 0.3 mg/0.3 mL IJ SOAJ injection Inject 0.3 mg into the muscle Once PRN.  ? ezetimibe (ZETIA) 10 MG tablet Take 1 tablet (10 mg total) by mouth daily.  ? loratadine (CLARITIN) 10  MG tablet Take 10 mg by mouth daily as needed for allergies.  ? Multiple Vitamins-Minerals (EYE VITAMINS) TABS Take 1 tablet by mouth 2 (two) times daily.   ? nitroGLYCERIN (NITROSTAT) 0.4 MG SL tablet PLACE 1 TABLET UNDER THE TONGUE AND ALLOW TO DISSOLVE EVERY 5 MINUTES AS NEEDED FOR CHEST PAIN AS DIRECTED BY YOUR DOCTOR.  ? SYNTHROID 75 MCG tablet TAKE 1 TABLET DAILY  ? [DISCONTINUED] clobetasol (TEMOVATE) 0.05 % external solution Apply 1 application topically daily as needed. to scalp.  ? ? ?Allergies: ?Patient is allergic to shellfish allergy, codeine, imdur [isosorbide nitrate], lanolin, latex, pollen extract, statins, cephalexin,  ciprofloxacin, clindamycin/lincomycin, erythromycin, penicillins, propylene glycol, sulfa antibiotics, and tetracycline. ?Family History: ?Patient family history includes Colon cancer (age of onset: 43) in her mother; Stroke in an other family member; Stroke (age of onset: 11) in her father. ?Social History:  ?Patient  reports that she has never smoked. She has never used smokeless tobacco. She reports current alcohol use of about 1.0 standard drink per week. She reports that she does not use drugs. ? ?Review of Systems: ?Constitutional: Negative for fever malaise or anorexia ?Cardiovascular: negative for chest pain ?Respiratory: negative for SOB or persistent cough ?Gastrointestinal: negative for abdominal pain ? ?Objective  ?Vitals: BP 110/60   Pulse 76   Temp 98.3 ?F (36.8 ?C) (Temporal)   Ht 5' 3.5" (1.613 m)   Wt 149 lb (67.6 kg)   SpO2 96%   BMI 25.98 kg/m?  ?General: no acute distress , A&Ox3 ? ? ? ?Commons side effects, risks, benefits, and alternatives for medications and treatment plan prescribed today were discussed, and the patient expressed understanding of the given instructions. Patient is instructed to call or message via MyChart if he/she has any questions or concerns regarding our treatment plan. No barriers to understanding were identified. We discussed Red Flag symptoms and signs in detail. Patient expressed understanding regarding what to do in case of urgent or emergency type symptoms.  ?Medication list was reconciled, printed and provided to the patient in AVS. Patient instructions and summary information was reviewed with the patient as documented in the AVS. ?This note was prepared with assistance of Systems analyst. Occasional wrong-word or sound-a-like substitutions may have occurred due to the inherent limitations of voice recognition software ? ?This visit occurred during the SARS-CoV-2 public health emergency.  Safety protocols were in place, including screening  questions prior to the visit, additional usage of staff PPE, and extensive cleaning of exam room while observing appropriate contact time as indicated for disinfecting solutions.  ? ?

## 2022-02-14 MED ORDER — SYNTHROID 75 MCG PO TABS: 75.0000 ug | ORAL_TABLET | Freq: Every day | ORAL | 3 refills | Status: DC

## 2022-02-14 NOTE — Addendum Note (Signed)
Addended by: Billey Chang on: 02/14/2022 03:27 PM ? ? Modules accepted: Orders ? ?

## 2022-03-10 ENCOUNTER — Encounter: Payer: Self-pay | Admitting: Internal Medicine

## 2022-03-10 ENCOUNTER — Other Ambulatory Visit: Payer: Self-pay | Admitting: Internal Medicine

## 2022-03-10 ENCOUNTER — Ambulatory Visit: Payer: Medicare Other | Admitting: Internal Medicine

## 2022-03-10 VITALS — BP 122/74 | HR 89 | Ht 63.5 in | Wt 147.6 lb

## 2022-03-10 DIAGNOSIS — M810 Age-related osteoporosis without current pathological fracture: Secondary | ICD-10-CM

## 2022-03-10 DIAGNOSIS — E213 Hyperparathyroidism, unspecified: Secondary | ICD-10-CM

## 2022-03-10 DIAGNOSIS — E559 Vitamin D deficiency, unspecified: Secondary | ICD-10-CM | POA: Diagnosis not present

## 2022-03-10 NOTE — Patient Instructions (Addendum)
Please return in 1 year.  Try Turmeric 2x a day.  Exercise for Strong Bones (from [place]) There are two types of exercises that are important for building and maintaining bone density:  weight-bearing and muscle-strengthening exercises. Weight-bearing Exercises These exercises include activities that make you move against gravity while staying upright. Weight-bearing exercises can be high-impact or low-impact. High-impact weight-bearing exercises help build bones and keep them strong. If you have broken a bone due to osteoporosis or are at risk of breaking a bone, you may need to avoid high-impact exercises. If you're not sure, you should check with your healthcare provider. Examples of high-impact weight-bearing exercises are: Dancing Doing high-impact aerobics Hiking Jogging/running Jumping Rope Stair climbing Tennis Low-impact weight-bearing exercises can also help keep bones strong and are a safe alternative if you cannot do high-impact exercises. Examples of low-impact weight-bearing exercises are: Using elliptical training machines Doing low-impact aerobics Using stair-step machines Fast walking on a treadmill or outside Muscle-Strengthening Exercises These exercises include activities where you move your body, a weight or some other resistance against gravity. They are also known as resistance exercises and include: Lifting weights Using elastic exercise bands Using weight machines Lifting your own body weight Functional movements, such as standing and rising up on your toes Yoga and Pilates can also improve strength, balance and flexibility. However, certain positions may not be safe for people with osteoporosis or those at increased risk of broken bones. For example, exercises that have you bend forward may increase the chance of breaking a bone in the spine. A physical therapist should be able to help you learn which exercises are safe and appropriate  for you. Non-Impact Exercises Non-impact exercises can help you to improve balance, posture and how well you move in everyday activities. These exercises can also help to increase muscle strength and decrease the risk of falls and broken bones. Some of these exercises include: Balance exercises that strengthen your legs and test your balance, such as Tai Chi, can decrease your risk of falls. Posture exercises that improve your posture and reduce rounded or "sloping" shoulders can help you decrease the chance of breaking a bone, especially in the spine. Functional exercises that improve how well you move can help you with everyday activities and decrease your chance of falling and breaking a bone. For example, if you have trouble getting up from a chair or climbing stairs, you should do these activities as exercises. A physical therapist can teach you balance, posture and functional exercises. Starting a New Exercise Program If you haven't exercised regularly for a while, check with your healthcare provider before beginning a new exercise program--particularly if you have health problems such as heart disease, diabetes or high blood pressure. If you're at high risk of breaking a bone, you should work with a physical therapist to develop a safe exercise program. Once you have your healthcare provider's approval, start slowly. If you've already broken bones in the spine because of osteoporosis, be very careful to avoid activities that require reaching down, bending forward, rapid twisting motions, heavy lifting and those that increase your chance of a fall. As you get started, your muscles may feel sore for a day or two after you exercise. If soreness lasts longer, you may be working too hard and need to ease up. Exercises should be done in a pain-free range of motion. How Much Exercise Do You Need? Weight-bearing exercises 30 minutes on most days of the week. Do a 30-minutesession or multiple  sessions  spread out throughout the day. The benefits to your bones are the same.   Muscle-strengthening exercises Two to three days per week. If you don't have much time for strengthening/resistance training, do small amounts at a time. You can do just one body part each day. For example do arms one day, legs the next and trunk the next. You can also spread these exercises out during your normal day.  Balance, posture and functional exercises Every day or as often as needed. You may want to focus on one area more than the others. If you have fallen or lose your balance, spend time doing balance exercises. If you are getting rounded shoulders, work more on posture exercises. If you have trouble climbing stairs or getting up from the couch, do more functional exercises. You can also perform these exercises at one time or spread them during your day. Work with a phyiscal therapist to learn the right exercises for you.   Denosumab injection What is this medication? DENOSUMAB (den oh sue mab) slows bone breakdown. Prolia is used to treat osteoporosis in women after menopause and in men, and in people who are taking corticosteroids for 6 months or more. Delton See is used to treat a high calcium level due to cancer and to prevent bone fractures and other bone problems caused by multiple myeloma or cancer bone metastases. Delton See is also used to treat giant cell tumor of the bone. This medicine may be used for other purposes; ask your health care provider or pharmacist if you have questions. COMMON BRAND NAME(S): Prolia, XGEVA What should I tell my care team before I take this medication? They need to know if you have any of these conditions: dental disease having surgery or tooth extraction infection kidney disease low levels of calcium or Vitamin D in the blood malnutrition on hemodialysis skin conditions or sensitivity thyroid or parathyroid disease an unusual reaction to denosumab, other medicines, foods, dyes, or  preservatives pregnant or trying to get pregnant breast-feeding How should I use this medication? This medicine is for injection under the skin. It is given by a health care professional in a hospital or clinic setting. A special MedGuide will be given to you before each treatment. Be sure to read this information carefully each time. For Prolia, talk to your pediatrician regarding the use of this medicine in children. Special care may be needed. For Delton See, talk to your pediatrician regarding the use of this medicine in children. While this drug may be prescribed for children as young as 13 years for selected conditions, precautions do apply. Overdosage: If you think you have taken too much of this medicine contact a poison control center or emergency room at once. NOTE: This medicine is only for you. Do not share this medicine with others. What if I miss a dose? It is important not to miss your dose. Call your doctor or health care professional if you are unable to keep an appointment. What may interact with this medication? Do not take this medicine with any of the following medications: other medicines containing denosumab This medicine may also interact with the following medications: medicines that lower your chance of fighting infection steroid medicines like prednisone or cortisone This list may not describe all possible interactions. Give your health care provider a list of all the medicines, herbs, non-prescription drugs, or dietary supplements you use. Also tell them if you smoke, drink alcohol, or use illegal drugs. Some items may interact with your medicine. What  should I watch for while using this medication? Visit your doctor or health care professional for regular checks on your progress. Your doctor or health care professional may order blood tests and other tests to see how you are doing. Call your doctor or health care professional for advice if you get a fever, chills or sore  throat, or other symptoms of a cold or flu. Do not treat yourself. This drug may decrease your body's ability to fight infection. Try to avoid being around people who are sick. You should make sure you get enough calcium and vitamin D while you are taking this medicine, unless your doctor tells you not to. Discuss the foods you eat and the vitamins you take with your health care professional. See your dentist regularly. Brush and floss your teeth as directed. Before you have any dental work done, tell your dentist you are receiving this medicine. Do not become pregnant while taking this medicine or for 5 months after stopping it. Talk with your doctor or health care professional about your birth control options while taking this medicine. Women should inform their doctor if they wish to become pregnant or think they might be pregnant. There is a potential for serious side effects to an unborn child. Talk to your health care professional or pharmacist for more information. What side effects may I notice from receiving this medication? Side effects that you should report to your doctor or health care professional as soon as possible: allergic reactions like skin rash, itching or hives, swelling of the face, lips, or tongue bone pain breathing problems dizziness jaw pain, especially after dental work redness, blistering, peeling of the skin signs and symptoms of infection like fever or chills; cough; sore throat; pain or trouble passing urine signs of low calcium like fast heartbeat, muscle cramps or muscle pain; pain, tingling, numbness in the hands or feet; seizures unusual bleeding or bruising unusually weak or tired Side effects that usually do not require medical attention (report to your doctor or health care professional if they continue or are bothersome): constipation diarrhea headache joint pain loss of appetite muscle pain runny nose tiredness upset stomach This list may not  describe all possible side effects. Call your doctor for medical advice about side effects. You may report side effects to FDA at 1-800-FDA-1088. Where should I keep my medication? This medicine is only given in a clinic, doctor's office, or other health care setting and will not be stored at home. NOTE: This sheet is a summary. It may not cover all possible information. If you have questions about this medicine, talk to your doctor, pharmacist, or health care provider.  2023 Elsevier/Gold Standard (2018-01-26 00:00:00)

## 2022-03-10 NOTE — Progress Notes (Signed)
This is patient ID: Victoria Victoria [name], female   DOB: 09/05/1941, 81 y.o.   MRN: [id number]   HPI  Victoria Victoria [name] is a 81 y.o.-year-old female, returning for follow-up for primary hyperparathyroidism, now Victoria [name]/p parathyroidectomy in 2019, also osteoporosis and vitamin D deficiency.  Last visit 1 year ago  Interim history: No falls or fractures since last visit. She moved into [facility] before last visit. Since last OV, we started Fosamax 09/2021, but she stopped it 2/2 mm aches.  She actually tried it 3 times before stopping it.  Pain improved after coming off the medication. She tried swimming, but now just walking. Still has muscle pain.  Reviewed history: She has had hypercalcemia since at least 2015.  Reviewed pertinent labs: Lab Results  Component Value Date   PTH 32 10/23/2018   PTH Comment 10/23/2018   PTH 71 (H) 06/27/2018   PTH 135 (H) 05/11/2018   PTH 62.4 04/07/2009   CALCIUM 9.6 02/11/2022   CALCIUM 9.5 11/01/2021   CALCIUM 9.9 03/26/2021   CALCIUM 9.7 08/18/2020   CALCIUM 9.5 12/24/2019   CALCIUM 9.5 09/20/2019   CALCIUM 9.6 04/23/2019   CALCIUM 9.9 10/23/2018   CALCIUM 10.6 (H) 08/06/2018   CALCIUM 11.0 (H) 06/27/2018  Postop labs: PTH 53, calcium 9.7 03/25/2011: Calcium 10.1 (8.6-10.2) 07/22/2010, calcium 10 (8.6-10.2)  She had a high calcitriol, normal phosphorus and magnesium Component     Latest Ref Rng & Units 06/27/2018          Vitamin D 1, 25 (OH) Total     18 - 72 pg/mL 84 (H)  Vitamin D3 1, 25 (OH)     pg/mL 84  Vitamin D2 1, 25 (OH)     pg/mL <8  Phosphorus     2.3 - 4.6 mg/dL 2.9  Magnesium     1.5 - 2.5 mg/dL 2.3   Urine calcium level was elevated: Component     Latest Ref Rng & Units 07/04/2018          Creatinine, 24H Ur     0.50 - 2.15 g/24 h 0.88  Calcium, 24H Urine     mg/24 h 273 (H)   08/14/2018: left inferior parathyroidectomy by Dr. Harlow Asa.  Pathology showed a 0.4 g hypercellular parathyroid, enlarged, measuring 1.2 x  1.2 x 0.5 cm.  Review latest DXA scan reports which show osteoporosis ([place]): 09/10/2021 Lumbar spine L1-L4 Femoral neck (FN) 33% distal radius  Victoria-score -2.7 RFN: -3.2 LFN: -3.2 -2.8  Change in BMD from previous DXA test (%) -0.9% -2.3% +1.4%  (*) statistically significant  08/12/2019 Lumbar spine L1-L4 Femoral neck (FN) 33% distal radius  Victoria-score -2.7 RFN: -3.0 LFN: -3.1 -2.9  Change in BMD from previous DXA test (%)  +0.5%  +0.1% n/a  (*) statistically significant  08/07/2017 Lumbar spine L1-L4 Femoral neck (FN)  Victoria-score -2.7 RFN: -3.1 LFN: -3.1  Change in BMD from previous DXA test from 2015 (%) -3.4%* -7.9%*  (*) statistically significant  She was on Fosamax in 1990'Victoria [name]- early 2000s, for ~10 years. She tolerated this well, however, retrospectively, she developed fibromyalgia around that time and she wonders whether this may have been related to Fosamax. We restarted Fosamax 09/2021. Tried this 3x >> mm aches.  She is not doing weightbearing exercises.  She is walking 0.5 mi a day.  Previously also swimming.  No history of kidney stones.  No CKD.  Reviewed her latest BUN/creatinine: Lab Results  Component Value Date   BUN 18  02/11/2022   BUN 16 11/01/2021   CREATININE 0.90 02/11/2022   CREATININE 0.86 11/01/2021   She has a history of vitamin D deficiency but recent vitamin D levels were normal: Lab Results  Component Value Date   VD25OH 37.21 02/11/2022   VD25OH 43.3 03/26/2021   VD25OH 41.42 09/20/2019   VD25OH 43.92 10/23/2018   VD25OH 40.03 05/11/2018   VD25OH 36 01/23/2014   VD25OH 39 03/12/2012   VD25OH 39 03/07/2011   VD25OH 32 04/07/2009  07/30/2010: Vitamin D 27.2  She continues on vitamin D 2000 units daily.  Pt does not have a FH of hypercalcemia, pituitary tumors, thyroid cancer, or osteoporosis.   She also has a history of controlled hypothyroidism, on levothyroxine.  TSH has been normal: Lab Results  Component Value Date   TSH 1.56 02/11/2022    TSH 1.79 03/26/2021   TSH 0.66 08/18/2020   TSH 0.78 12/24/2019   TSH 0.64 06/05/2019   At last visit we checked her B12 due to complaints of fatigue and this was low in the normal range: Lab Results  Component Value Date   VITAMINB12 295 03/26/2021  I advised her to start 500 mcg B12 daily.  She has CAD and had an AMI 2009.  ROS: + See HPI  Past Medical History:  Diagnosis Date   Acute lumbar radiculopathy 04/2012   Anemia    history of   Anxiety    Atopic eczema    CAD (coronary artery disease)    Complication of anesthesia    Depression    Dyspnea    with activity   Essential hypertension, benign    Hyperlipidemia, statin intolerant    Hypothyroidism    Myocardial infarction [facility]) 2009   NSTEMI (non-ST elevated myocardial infarction) ([place]) 2009   Osteoporosis, Victoria [name]/p bisphophonates x 7 years, stopped in 2011    Pneumonia    PONV (postoperative nausea and vomiting)    Sacrum and coccyx fracture [facility])    Past Surgical History:  Procedure Laterality Date   APPENDECTOMY     CARDIAC CATHETERIZATION  2009   CARPAL TUNNEL RELEASE Bilateral    cateract syrgery Bilateral 10/2018 and 11/2018   HIP SURGERY  1980s   due to infection    LAPAROSCOPIC HYSTERECTOMY     LEFT HEART CATHETERIZATION WITH CORONARY ANGIOGRAM N/A 11/18/2013   Procedure: LEFT HEART CATHETERIZATION WITH CORONARY ANGIOGRAM;  Surgeon: Blane Ohara, MD;  Location: [facility] CATH LAB;  Service: Cardiovascular;  Laterality: N/A;   Left interior parathyroiectomy     Dr. Harlow Asa 08-14-18   LUMBAR LAMINECTOMY/DECOMPRESSION MICRODISCECTOMY  04/17/2012   Procedure: LUMBAR LAMINECTOMY/DECOMPRESSION MICRODISCECTOMY 1 LEVEL;  Surgeon: Erline Levine, MD;  Location: [place] NEURO ORS;  Service: Neurosurgery;  Laterality: Left;  Left Lumbar five-Sacral one Microdiskectomy   PARATHYROIDECTOMY Left 08/14/2018   Procedure: LEFT INFERIOR PARATHYROIDECTOMY;  Surgeon: Armandina Gemma, MD;  Location: [facility] ORS;  Service: General;   Laterality: Left;   SPINE SURGERY  03/2018   Dr Vertell Limber   TONSILLECTOMY     TOOTH EXTRACTION     tooth implant     Social History   Socioeconomic History   Marital status: Married    Spouse name: Not on file   Number of children: 2   Years of education: Not on file   Highest education level: Not on file  Occupational History   Occupation: retired  Tobacco Use   Smoking status: Never   Smokeless tobacco: Never  Vaping Use   Vaping Use: Never  used  Substance and Sexual Activity   Alcohol use: Yes    Alcohol/week: 1.0 standard drink of alcohol    Types: 1 Glasses of wine per week    Comment: occasional glass of wine   Drug use: No   Sexual activity: Not Currently  Other Topics Concern   Not on file  Social History Narrative   Not on file   Social Determinants of Health   Financial Resource Strain: Not on file  Food Insecurity: Not on file  Transportation Needs: Not on file  Physical Activity: Not on file  Stress: Not on file  Social Connections: Not on file  Intimate Partner Violence: Not on file   Current Outpatient Medications on File Prior to Visit  Medication Sig Dispense Refill   alendronate (FOSAMAX) 70 MG tablet Take 1 tablet (70 mg total) by mouth every 7 (seven) days. Take with a full glass of water on an empty stomach. 15 tablet 3   amLODipine (NORVASC) 5 MG tablet Take 1 tablet (5 mg total) by mouth daily. 90 tablet 3   aspirin 81 MG tablet Take 81 mg by mouth daily.       atorvastatin (LIPITOR) 10 MG tablet TAKE 1 TABLET THREE TIMES  WEEKLY 36 tablet 3   Cholecalciferol (VITAMIN D) 2000 UNITS CAPS Take 1 capsule (2,000 Units total) by mouth daily. 30 capsule    clobetasol (TEMOVATE) 0.05 % external solution Apply 1 application. topically daily as needed. to scalp. 50 mL 1   Coenzyme Q10 (CO Q-10) 300 MG CAPS Take 300 mg by mouth daily.      desonide (DESOWEN) 0.05 % ointment Apply 1 application. topically 2 (two) times daily as needed.     Desoximetasone  (TOPICORT) 0.25 % ointment Apply 1 application topically 2 (two) times daily as needed (itching). Apply to hands.      EPINEPHrine 0.3 mg/0.3 mL IJ SOAJ injection Inject 0.3 mg into the muscle Once PRN.     ezetimibe (ZETIA) 10 MG tablet Take 1 tablet (10 mg total) by mouth daily. 90 tablet 3   loratadine (CLARITIN) 10 MG tablet Take 10 mg by mouth daily as needed for allergies.     Multiple Vitamins-Minerals (EYE VITAMINS) TABS Take 1 tablet by mouth 2 (two) times daily.      nitroGLYCERIN (NITROSTAT) 0.4 MG SL tablet PLACE 1 TABLET UNDER THE TONGUE AND ALLOW TO DISSOLVE EVERY 5 MINUTES AS NEEDED FOR CHEST PAIN AS DIRECTED BY YOUR DOCTOR. 25 tablet 3   SYNTHROID 75 MCG tablet Take 1 tablet (75 mcg total) by mouth daily. 90 tablet 3   No current facility-administered medications on file prior to visit.   Allergies  Allergen Reactions   Shellfish Allergy Hives and Shortness Of Breath   Codeine Other (See Comments)    Headache   Imdur [Isosorbide Nitrate] Other (See Comments)    Confusion   Lanolin Itching   Latex Other (See Comments)    Unknown, allergy testing result   Pollen Extract    Statins     Simvastatin, Lipitor (both caused muscle aches); Crestor 5 mg and 10 mg daily (memory impairment), Livalo (throat and mouth itching), pravastatin 10 mg qd (memory)   Cephalexin Itching and Rash   Ciprofloxacin Rash   Clindamycin/Lincomycin Rash   Erythromycin Itching and Rash   Penicillins Itching and Rash   Propylene Glycol Itching and Rash   Sulfa Antibiotics Itching and Rash   Tetracycline Itching and Rash   Family History  Problem Relation Age of Onset   Colon cancer Mother 44       1st degree   Stroke Father 66   Stroke Other        Grandfather- grandmother    PE: There were no vitals taken for this visit. Wt Readings from Last 3 Encounters:  02/11/22 149 lb (67.6 kg)  10/19/21 147 lb 3.2 oz (66.8 kg)  03/26/21 145 lb (65.8 kg)   Constitutional: normal weight, in  NAD Eyes: EOMI, no exophthalmos ENT: moist mucous membranes, no thyromegaly, no cervical lymphadenopathy Cardiovascular: RRR, No MRG Respiratory: CTA B Musculoskeletal: no deformities Skin: moist, warm, no rashes Neurological: no tremor with outstretched hands  Assessment: 1. Hypercalcemia/hyperparathyroidism  2.  History of vitamin D deficiency  3.  Osteoporosis  4.  Fatigue  Plan: Patient with history of several instances of elevated calcium, with the highest level being at 11.  Intact PTH was also high, at 135 for a corresponding calcium of 10.8.  Her vitamin D level was normal, as was her magnesium and phosphorus.  Calcitriol level was high and 24-hour urine calcium was also high.  She does not have a history of nephrolithiasis, but does have osteoporosis.  No fractures, abdominal pain, depression, bone pain.  Suspicion was high for primary hyperparathyroidism and I referred her to surgery.  She had right inferior parathyroidectomy by Dr. Harlow Asa on 08/14/2018 -Her calcium and PTH levels decreased after surgery to the normal range -Latest calcium level was normal last month: Lab Results  Component Value Date   CALCIUM 9.6 02/11/2022   PHOS 2.9 06/27/2018  -She is feeling well, without complaints  2.  History of vitamin D deficiency -At last visit, vitamin D level was normal, at 43.3 but she had another vitamin D level checked since last visit and this was again normal: Lab Results  Component Value Date   VD25OH 37.21 02/11/2022  -She continues on 2000 units vitamin D daily  3.  Osteoporosis -Patient has a history of osteoporosis for which I saw her in the past.   -No falls or fractures since last visit -Reviewed her bone density scan report from 08/12/2019 and it appears that the Victoria-scores were lower than those from 2015, but stable from 2018.  She started weightbearing exercises after her DXA scan from 2020 and she also had parathyroidectomy in 2019.  She had another bone  density scan on 09/10/2021 and this showed slightly lower Victoria-scores, but the decrease was nonsignificant.  The scores were still low, though.  After discussion about expected effects (decreased fracture risk by approximately 50%) and side effects (including ONJ and atypical fractures) of different osteoporotic medications, but the very advantageous benefit-risk ratio, she agreed to start Fosamax in 09/2021.  She had back and neck muscle aches similar to those due to her fibromyalgia.  She decided to stop Fosamax for 2 weeks to see if they improved.  She ended up trying this 3 times, but could not continue due to muscle aches.  At this visit, we discussed about trying Prolia and she would like to start this.  Given written information about the medication. -She continues on 2000 units vitamin D daily, with normal vitamin D levels -Last visit I recommended weightbearing exercise -given a list of exercises recommended by the National osteoporosis foundation.  She did not try this.  I gave her the list again.  We discussed that swimming and walking are likely not enough. -We will repeat another bone density scan in 2  years from the previous -I will see the patient back in 1 year  4.  Fatigue -At last visit, she described fatigue, cold intolerance, dry skin. -Her TFTs were normal.  Vitamin D was also normal.  Vitamin B12 was low normal.  I advised her to start 500 mcg daily of B12.  I also suggested fish oil 1000 mg daily for dry skin but I advised her to check with cardiology first since this may have a blood thinning effect. Lab Results  Component Value Date   VITAMINB12 295 03/26/2021    Philemon Kingdom, MD PhD [facility] Endocrinology

## 2022-03-15 ENCOUNTER — Telehealth: Payer: Self-pay

## 2022-03-15 NOTE — Telephone Encounter (Signed)
PA PROCESS DETAILS: Effective 10/03/2021 if the patient is new to Prolia, Prior authorization and Step Therapy are required & not on file. Please go to [email or web address] or call [phone] to initiate the prior authorization. For exception to the policy please visit [email or web address] and review Policy Number [email or web address]

## 2022-03-15 NOTE — Telephone Encounter (Signed)
From: Philemon Kingdom, MD  Sent: 03/10/2022   9:50 AM EDT  To: Lauralyn Primes, RMA; Jasper Loser, CMA   Hi Theadora Rama,  Can you please submit this patient to the portal for Prolia?  She had labs within 1 months.  She was intolerant to Fosamax due to muscle aches.  She tried it 3 times in the last 6 months.  If approved, we can schedule a nurse appointment to start this.  I will be out for the next 3 weeks, but I am attaching Tileshia to this so she is aware.  Thank you!  CG

## 2022-03-18 ENCOUNTER — Ambulatory Visit: Payer: Medicare Other | Admitting: Internal Medicine

## 2022-06-08 ENCOUNTER — Encounter: Payer: Self-pay | Admitting: Internal Medicine

## 2022-06-08 ENCOUNTER — Non-Acute Institutional Stay: Payer: Medicare Other | Admitting: Internal Medicine

## 2022-06-08 VITALS — BP 132/83 | HR 90 | Temp 98.0°F | Ht 63.5 in | Wt 146.3 lb

## 2022-06-08 DIAGNOSIS — M542 Cervicalgia: Secondary | ICD-10-CM

## 2022-06-08 DIAGNOSIS — M818 Other osteoporosis without current pathological fracture: Secondary | ICD-10-CM

## 2022-06-08 DIAGNOSIS — F329 Major depressive disorder, single episode, unspecified: Secondary | ICD-10-CM

## 2022-06-08 DIAGNOSIS — I2510 Atherosclerotic heart disease of native coronary artery without angina pectoris: Secondary | ICD-10-CM

## 2022-06-08 DIAGNOSIS — E782 Mixed hyperlipidemia: Secondary | ICD-10-CM

## 2022-06-08 DIAGNOSIS — E039 Hypothyroidism, unspecified: Secondary | ICD-10-CM

## 2022-06-08 DIAGNOSIS — E559 Vitamin D deficiency, unspecified: Secondary | ICD-10-CM

## 2022-06-08 DIAGNOSIS — I10 Essential (primary) hypertension: Secondary | ICD-10-CM

## 2022-06-19 NOTE — Progress Notes (Signed)
Location:  [facility] (12)  Provider:   Code Status:  Goals of Care:     06/08/2022    1:01 PM  Advanced Directives  Does Patient Have a Medical Advance Directive? Yes  Type of Paramedic of [place];Living will  Does patient want to make changes to medical advance directive? Yes (ED - send information to MyChart)  Copy of [place] in Chart? No - copy requested     Chief Complaint  Patient presents with   Establish Care    NP to Establish Care    HPI: Patient is a 81 y.o. female seen today for medical management of chronic diseases.    Came to establish care  Lives [facility] with her husband  She has a history of Primary hyperparathyroidism s/p Parathyroidectomy in 2019 History of osteoporosis and vitamin D deficiency T score -3 in femoral neck and -2.7 in lumbar spine History of hypothyroidism B12 deficiency History of CAD per cardiologist stable with episodic symptoms of angina.  Managed conservatively.  Acute issues Was complaining of pain in her left going down her fingers.  With numbness. Also had neck pain Depression and stress Having some issues with her son. Past Medical History:  Diagnosis Date   Acute lumbar radiculopathy 04/2012   Anemia    history of   Anxiety    Atopic eczema    CAD (coronary artery disease)    Complication of anesthesia    Depression    Dyspnea    with activity   Essential hypertension, benign    Hyperlipidemia, statin intolerant    Hypothyroidism    Myocardial infarction [facility]) 2009   NSTEMI (non-ST elevated myocardial infarction) ([place]) 2009   Osteoporosis, s/p bisphophonates x 7 years, stopped in 2011    Pneumonia    PONV (postoperative nausea and vomiting)    Sacrum and coccyx fracture [facility])     Past Surgical History:  Procedure Laterality Date   APPENDECTOMY     CARDIAC CATHETERIZATION  2009   CARPAL TUNNEL RELEASE Bilateral     cateract syrgery Bilateral 10/2018 and 11/2018   HIP SURGERY  1980s   due to infection    LAPAROSCOPIC HYSTERECTOMY     LEFT HEART CATHETERIZATION WITH CORONARY ANGIOGRAM N/A 11/18/2013   Procedure: LEFT HEART CATHETERIZATION WITH CORONARY ANGIOGRAM;  Surgeon: Blane Ohara, MD;  Location: [facility] CATH LAB;  Service: Cardiovascular;  Laterality: N/A;   Left interior parathyroiectomy     Dr. Harlow Asa 08-14-18   LUMBAR LAMINECTOMY/DECOMPRESSION MICRODISCECTOMY  04/17/2012   Procedure: LUMBAR LAMINECTOMY/DECOMPRESSION MICRODISCECTOMY 1 LEVEL;  Surgeon: Erline Levine, MD;  Location: [place] NEURO ORS;  Service: Neurosurgery;  Laterality: Left;  Left Lumbar five-Sacral one Microdiskectomy   PARATHYROIDECTOMY Left 08/14/2018   Procedure: LEFT INFERIOR PARATHYROIDECTOMY;  Surgeon: Armandina Gemma, MD;  Location: [facility] ORS;  Service: General;  Laterality: Left;   SPINE SURGERY  03/2018   Dr Vertell Limber   TONSILLECTOMY     TOOTH EXTRACTION     tooth implant      Allergies  Allergen Reactions   Shellfish Allergy Hives and Shortness Of Breath   Codeine Other (See Comments)    Headache   Imdur [Isosorbide Nitrate] Other (See Comments)    Confusion   Lanolin Itching   Latex Other (See Comments)    Unknown, allergy testing result   Pollen Extract    Statins     Simvastatin, Lipitor (both  caused muscle aches); Crestor 5 mg and 10 mg daily (memory impairment), Livalo (throat and mouth itching), pravastatin 10 mg qd (memory)   Tramadol     Patient said she had Mental break down    Cephalexin Itching and Rash   Ciprofloxacin Rash   Clindamycin/Lincomycin Rash   Erythromycin Itching and Rash   Penicillins Itching and Rash   Propylene Glycol Itching and Rash   Sulfa Antibiotics Itching and Rash   Tetracycline Itching and Rash    Outpatient Encounter Medications as of 06/08/2022  Medication Sig   amLODipine (NORVASC) 5 MG tablet Take 1 tablet (5 mg total) by mouth daily.   aspirin 81 MG tablet Take 81 mg by mouth  daily.     atorvastatin (LIPITOR) 10 MG tablet TAKE 1 TABLET THREE TIMES  WEEKLY   Cholecalciferol (VITAMIN D) 2000 UNITS CAPS Take 1 capsule (2,000 Units total) by mouth daily.   clobetasol (TEMOVATE) 0.05 % external solution Apply 1 application. topically daily as needed. to scalp.   Coenzyme Q10 (CO Q-10) 300 MG CAPS Take 300 mg by mouth daily.    desonide (DESOWEN) 0.05 % ointment Apply 1 application. topically 2 (two) times daily as needed.   Desoximetasone (TOPICORT) 0.25 % ointment Apply 1 application topically 2 (two) times daily as needed (itching). Apply to hands.    EPINEPHrine 0.3 mg/0.3 mL IJ SOAJ injection Inject 0.3 mg into the muscle Once PRN.   ezetimibe (ZETIA) 10 MG tablet Take 1 tablet (10 mg total) by mouth daily.   loratadine (CLARITIN) 10 MG tablet Take 10 mg by mouth daily as needed for allergies.   Multiple Vitamins-Minerals (EYE VITAMINS) TABS Take 1 tablet by mouth 2 (two) times daily.    nitroGLYCERIN (NITROSTAT) 0.4 MG SL tablet PLACE 1 TABLET UNDER THE TONGUE AND ALLOW TO DISSOLVE EVERY 5 MINUTES AS NEEDED FOR CHEST PAIN AS DIRECTED BY YOUR DOCTOR.   SYNTHROID 75 MCG tablet Take 1 tablet (75 mcg total) by mouth daily.   alendronate (FOSAMAX) 70 MG tablet Take 1 tablet (70 mg total) by mouth every 7 (seven) days. Take with a full glass of water on an empty stomach. (Patient not taking: Reported on 06/08/2022)   No facility-administered encounter medications on file as of 06/08/2022.    Review of Systems:  Review of Systems  Constitutional:  Negative for activity change and appetite change.  HENT: Negative.    Respiratory:  Negative for cough and shortness of breath.   Cardiovascular:  Negative for leg swelling.  Gastrointestinal:  Negative for constipation.  Genitourinary: Negative.   Musculoskeletal:  Positive for arthralgias and back pain. Negative for gait problem and myalgias.  Skin: Negative.   Neurological:  Negative for dizziness and weakness.   Psychiatric/Behavioral:  Negative for confusion, dysphoric mood and sleep disturbance.     Health Maintenance  Topic Date Due   MAMMOGRAM  02/12/2023 (Originally 01/02/2021)   TETANUS/TDAP  01/08/2023   DEXA SCAN  09/11/2023   Pneumonia Vaccine 26+ Years old  Completed   COVID-19 Vaccine  Completed   Zoster Vaccines- Shingrix  Completed   HPV VACCINES  Aged Out   INFLUENZA VACCINE  Discontinued    Physical Exam: Vitals:   06/08/22 1258  BP: 132/83  Pulse: 90  Temp: 98 F (36.7 C)  SpO2: 96%  Weight: 146 lb 4.8 oz (66.4 kg)  Height: 5' 3.5" (1.613 m)   Body mass index is 25.51 kg/m. Physical Exam Vitals reviewed.  Constitutional:  Appearance: Normal appearance.  HENT:     Head: Normocephalic.     Nose: Nose normal.     Mouth/Throat:     Mouth: Mucous membranes are moist.     Pharynx: Oropharynx is clear.  Eyes:     Pupils: Pupils are equal, round, and reactive to light.  Cardiovascular:     Rate and Rhythm: Normal rate and regular rhythm.     Pulses: Normal pulses.     Heart sounds: Normal heart sounds. No murmur heard. Pulmonary:     Effort: Pulmonary effort is normal.     Breath sounds: Normal breath sounds.  Abdominal:     General: Abdomen is flat. Bowel sounds are normal.     Palpations: Abdomen is soft.  Musculoskeletal:        General: No swelling.     Cervical back: Neck supple.     Comments: C/o Pain with moving her neck reflecting down to her Left Arm  Skin:    General: Skin is warm.  Neurological:     General: No focal deficit present.     Mental Status: She is alert and oriented to person, place, and time.  Psychiatric:        Mood and Affect: Mood normal.        Thought Content: Thought content normal.     Labs reviewed: Basic Metabolic Panel: Recent Labs    11/01/21 0746 02/11/22 0857  NA 142 140  K 4.4 3.9  CL 104 104  CO2 24 29  GLUCOSE 87 61*  BUN 16 18  CREATININE 0.86 0.90  CALCIUM 9.5 9.6  TSH  --  1.56   Liver  Function Tests: Recent Labs    11/01/21 0746 02/11/22 0857  AST 20 16  ALT 18 16  ALKPHOS 73 48  BILITOT 0.5 0.5  PROT 7.5 7.4  ALBUMIN 4.5 4.2   No results for input(s): "LIPASE", "AMYLASE" in the last 8760 hours. No results for input(s): "AMMONIA" in the last 8760 hours. CBC: Recent Labs    11/01/21 0746  WBC 6.4  HGB 14.1  HCT 41.6  MCV 93  PLT 264   Lipid Panel: Recent Labs    11/01/21 0746  CHOL 165  HDL 52  LDLCALC 97  TRIG 85  CHOLHDL 3.2   Lab Results  Component Value Date   HGBA1C 5.5 05/17/2016    Procedures since last visit: No results found.  Assessment/Plan 1. Neck pain Therapy after MRI - MR CERVICAL SPINE W WO CONTRAST; Future  2. Reactive depression Discussed all options She wants to wait  3. Age-related osteoporosis without fracture Plan for Prolia per Dr Renne Crigler  4. Acquired hypothyroidism TSH normal in 05/23  5. Coronary artery disease involving native coronary artery of native heart, unspecified whether angina present Follows with Cardiology Intoerant to Isosorbide Takes Nitro prn Aspirin,Statin 6. Mixed hyperlipidemia On statin and Zetia  7. Essential hypertension, benign Controlled Norvasc  8. Vitamin D deficiency Supplement    Labs/tests ordered:  * No order type specified * Next appt:  10/06/2022

## 2022-06-26 ENCOUNTER — Ambulatory Visit
Admission: RE | Admit: 2022-06-26 | Discharge: 2022-06-26 | Disposition: A | Discharge status: Home / Self Care | Payer: Medicare Other | Source: Ambulatory Visit | Attending: Internal Medicine | Admitting: Internal Medicine

## 2022-06-26 DIAGNOSIS — M542 Cervicalgia: Secondary | ICD-10-CM

## 2022-06-26 MED ORDER — GADOBENATE DIMEGLUMINE 529 MG/ML IV SOLN
13.0000 mL | Freq: Once | INTRAVENOUS | Status: AC | PRN
  Administered 2022-06-26: 13 mL via INTRAVENOUS

## 2022-06-30 ENCOUNTER — Other Ambulatory Visit: Payer: Self-pay | Admitting: Internal Medicine

## 2022-06-30 DIAGNOSIS — M542 Cervicalgia: Secondary | ICD-10-CM

## 2022-07-09 ENCOUNTER — Other Ambulatory Visit: Payer: Self-pay

## 2022-07-09 ENCOUNTER — Emergency Department (HOSPITAL_COMMUNITY)
Admission: EM | Admit: 2022-07-09 | Discharge: 2022-07-09 | Disposition: A | Discharge status: Home / Self Care | Payer: Medicare Other | Attending: Emergency Medicine | Admitting: Emergency Medicine

## 2022-07-09 ENCOUNTER — Emergency Department (HOSPITAL_COMMUNITY): Payer: Medicare Other

## 2022-07-09 DIAGNOSIS — R0789 Other chest pain: Secondary | ICD-10-CM | POA: Diagnosis present

## 2022-07-09 DIAGNOSIS — I2510 Atherosclerotic heart disease of native coronary artery without angina pectoris: Secondary | ICD-10-CM | POA: Diagnosis not present

## 2022-07-09 DIAGNOSIS — R1013 Epigastric pain: Secondary | ICD-10-CM | POA: Insufficient documentation

## 2022-07-09 DIAGNOSIS — E039 Hypothyroidism, unspecified: Secondary | ICD-10-CM | POA: Insufficient documentation

## 2022-07-09 DIAGNOSIS — I10 Essential (primary) hypertension: Secondary | ICD-10-CM | POA: Insufficient documentation

## 2022-07-09 DIAGNOSIS — Z7982 Long term (current) use of aspirin: Secondary | ICD-10-CM | POA: Insufficient documentation

## 2022-07-09 DIAGNOSIS — Z91040 Latex allergy status: Secondary | ICD-10-CM | POA: Insufficient documentation

## 2022-07-09 LAB — COMPREHENSIVE METABOLIC PANEL
ALT: 16 U/L (ref 0–44)
AST: 18 U/L (ref 15–41)
Albumin: 3.5 g/dL (ref 3.5–5.0)
Alkaline Phosphatase: 54 U/L (ref 38–126)
Anion gap: 9 (ref 5–15)
BUN: 17 mg/dL (ref 8–23)
CO2: 26 mmol/L (ref 22–32)
Calcium: 9.3 mg/dL (ref 8.9–10.3)
Chloride: 106 mmol/L (ref 98–111)
Creatinine, Ser: 0.9 mg/dL (ref 0.44–1.00)
GFR, Estimated: >60 mL/min (ref >60)
Glucose, Bld: 113 mg/dL — ABNORMAL HIGH (ref 70–99)
Potassium: 4.1 mmol/L (ref 3.5–5.1)
Sodium: 141 mmol/L (ref 135–145)
Total Bilirubin: 0.3 mg/dL (ref 0.3–1.2)
Total Protein: 6.7 g/dL (ref 6.5–8.1)

## 2022-07-09 LAB — CBC WITH DIFFERENTIAL/PLATELET
Abs Immature Granulocytes: 0.02 10*3/uL (ref 0.00–0.07)
Basophils Absolute: 0 10*3/uL (ref 0.0–0.1)
Basophils Relative: 1 %
Eosinophils Absolute: 0.3 10*3/uL (ref 0.0–0.5)
Eosinophils Relative: 4 %
HCT: 38 % (ref 36.0–46.0)
Hemoglobin: 12.6 g/dL (ref 12.0–15.0)
Immature Granulocytes: 0 %
Lymphocytes Relative: 41 %
Lymphs Abs: 2.9 10*3/uL (ref 0.7–4.0)
MCH: 31.5 pg (ref 26.0–34.0)
MCHC: 33.2 g/dL (ref 30.0–36.0)
MCV: 95 fL (ref 80.0–100.0)
Monocytes Absolute: 0.5 10*3/uL (ref 0.1–1.0)
Monocytes Relative: 7 %
Neutro Abs: 3.5 10*3/uL (ref 1.7–7.7)
Neutrophils Relative %: 47 %
Platelets: 237 10*3/uL (ref 150–400)
RBC: 4 MIL/uL (ref 3.87–5.11)
RDW: 12.8 % (ref 11.5–15.5)
WBC: 7.3 10*3/uL (ref 4.0–10.5)
nRBC: 0 % (ref 0.0–0.2)

## 2022-07-09 LAB — I-STAT CHEM 8, ED
BUN: 23 mg/dL (ref 8–23)
Calcium, Ion: 1.18 mmol/L (ref 1.15–1.40)
Chloride: 103 mmol/L (ref 98–111)
Creatinine, Ser: 0.8 mg/dL (ref 0.44–1.00)
Glucose, Bld: 109 mg/dL — ABNORMAL HIGH (ref 70–99)
HCT: 37 % (ref 36.0–46.0)
Hemoglobin: 12.6 g/dL (ref 12.0–15.0)
Potassium: 4.1 mmol/L (ref 3.5–5.1)
Sodium: 140 mmol/L (ref 135–145)
TCO2: 28 mmol/L (ref 22–32)

## 2022-07-09 LAB — LIPASE, BLOOD: Lipase: 33 U/L (ref 11–51)

## 2022-07-09 LAB — TROPONIN I (HIGH SENSITIVITY)
Troponin I (High Sensitivity): 6 ng/L (ref <18)
Troponin I (High Sensitivity): 4 ng/L (ref <18)

## 2022-07-09 MED ORDER — ALUM & MAG HYDROXIDE-SIMETH 200-200-20 MG/5ML PO SUSP
30.0000 mL | Freq: Once | ORAL | Status: DC
  Filled 2022-07-09: qty 30

## 2022-07-09 MED ORDER — LIDOCAINE VISCOUS HCL 2 % MT SOLN
15.0000 mL | Freq: Once | OROMUCOSAL | Status: DC
  Filled 2022-07-09: qty 15

## 2022-07-09 NOTE — ED Provider Notes (Signed)
[facility] EMERGENCY DEPARTMENT Provider Note  CSN: [id number] Arrival date & time: 07/09/22 0124  Chief Complaint(s) Chest Pain (Patient BIB by EMS with c/o chest pain left side radiates to back that awaken patient from sleep, took x3 nitro at home. ASA '324mg'$ , Morphine '4mg'$ , Nitro x1 H/O MI 3/10 pain VSS)  HPI Victoria [name] is a 81 y.o. female with a past medical history listed below including hypertension, hyperlipidemia, prior NSTEMI in 2009 not requiring any stenting.  She presents for chest tightness that awoke her from sleep tonight.   Chest Pain Pain location:  Epigastric and substernal area Pain quality: tightness   Pain radiates to:  Upper back and L arm Pain severity:  Moderate Onset quality:  Sudden Duration:  3 hours Timing:  Constant Chronicity:  New Context: at rest   Relieved by: sitting up and with morphine by EMS. Worsened by:  Certain positions (lying down) Ineffective treatments:  Nitroglycerin Associated symptoms: abdominal pain, nausea and shortness of breath   Associated symptoms: no anxiety, no cough, no fatigue, no fever, no lower extremity edema and no vomiting     Past Medical History Past Medical History:  Diagnosis Date   Acute lumbar radiculopathy 04/2012   Anemia    history of   Anxiety    Atopic eczema    CAD (coronary artery disease)    Complication of anesthesia    Depression    Dyspnea    with activity   Essential hypertension, benign    Hyperlipidemia, statin intolerant    Hypothyroidism    Myocardial infarction [facility]) 2009   NSTEMI (non-ST elevated myocardial infarction) ([place]) 2009   Osteoporosis, s/p bisphophonates x 7 years, stopped in 2011    Pneumonia    PONV (postoperative nausea and vomiting)    Sacrum and coccyx fracture [facility])    Patient Active Problem List   Diagnosis Date Noted   Multiple drug allergies 06/04/2019   Hx of laminectomy L5-1 10/31/2018   History of primary hyperparathyroidism  06/27/2018   Chronic right-sided low back pain with right-sided sciatica 08/15/2017   Essential hypertension, benign 05/14/2011   Vitamin D deficiency 08/02/2010   Allergic rhinitis 07/30/2010   CAD (coronary artery disease) 01/19/2009   Acquired hypothyroidism 12/04/2008   Mixed hyperlipidemia 12/04/2008   History of non-ST elevation myocardial infarction (NSTEMI) 12/04/2008   Age-related osteoporosis without fracture 12/04/2008   Home Medication(s) Prior to Admission medications   Medication Sig Start Date End Date Taking? Authorizing Provider  alendronate (FOSAMAX) 70 MG tablet Take 1 tablet (70 mg total) by mouth every 7 (seven) days. Take with a full glass of water on an empty stomach. Patient not taking: Reported on 06/08/2022 09/20/21   Philemon Kingdom, MD  amLODipine (NORVASC) 5 MG tablet Take 1 tablet (5 mg total) by mouth daily. 10/19/21   [name], MD  aspirin 81 MG tablet Take 81 mg by mouth daily.      [provider]  atorvastatin (LIPITOR) 10 MG tablet TAKE 1 TABLET THREE TIMES  WEEKLY 10/19/21   [name], MD  Cholecalciferol (VITAMIN D) 2000 UNITS CAPS Take 1 capsule (2,000 Units total) by mouth daily. 01/21/14   [name], MD  clobetasol (TEMOVATE) 0.05 % external solution Apply 1 application. topically daily as needed. to scalp. 02/11/22   Leamon Arnt, MD  Coenzyme Q10 (CO Q-10) 300 MG CAPS Take 300 mg by mouth daily.     [provider]  desonide (DESOWEN) 0.05 %  ointment Apply 1 application. topically 2 (two) times daily as needed. 10/28/21   [provider]  Desoximetasone (TOPICORT) 0.25 % ointment Apply 1 application topically 2 (two) times daily as needed (itching). Apply to hands.     [provider]  EPINEPHrine 0.3 mg/0.3 mL IJ SOAJ injection Inject 0.3 mg into the muscle Once PRN. 10/10/19   [provider]  ezetimibe (ZETIA) 10 MG tablet Take 1 tablet (10 mg total) by mouth daily. 10/19/21   [name], MD  loratadine (CLARITIN) 10 MG tablet Take 10 mg by mouth daily as needed for allergies.    [provider]  Multiple Vitamins-Minerals (EYE VITAMINS) TABS Take 1 tablet by mouth 2 (two) times daily.     [provider]  nitroGLYCERIN (NITROSTAT) 0.4 MG SL tablet PLACE 1 TABLET UNDER THE TONGUE AND ALLOW TO DISSOLVE EVERY 5 MINUTES AS NEEDED FOR CHEST PAIN AS DIRECTED BY YOUR DOCTOR. 10/19/21   [name], MD  SYNTHROID 75 MCG tablet Take 1 tablet (75 mcg total) by mouth daily. 02/14/22   Leamon Arnt, MD                                                                                                                                    Allergies Shellfish allergy, Codeine, Imdur [isosorbide nitrate], Lanolin, Latex, Pollen extract, Statins, Tramadol, Cephalexin, Ciprofloxacin, Clindamycin/lincomycin, Erythromycin, Penicillins, Propylene glycol, Sulfa antibiotics, and Tetracycline  Review of Systems Review of Systems  Constitutional:  Negative for fatigue and fever.  Respiratory:  Positive for shortness of breath. Negative for cough.   Cardiovascular:  Positive for chest pain.  Gastrointestinal:  Positive for abdominal pain and nausea. Negative for vomiting.   As noted in HPI  Physical Exam Vital Signs  I have reviewed the triage vital signs BP 136/77   Pulse 68   Temp 98.1 F (36.7 C)   Resp 12   SpO2 99%   Physical Exam Vitals reviewed.  Constitutional:      General: She is not in acute distress.    Appearance: She is well-developed. She is not diaphoretic.  HENT:     Head: Normocephalic and atraumatic.     Nose: Nose normal.  Eyes:     General: No scleral icterus.       Right eye: No discharge.        Left eye: No discharge.     Conjunctiva/sclera: Conjunctivae normal.     Pupils: Pupils are equal, round, and reactive to light.  Cardiovascular:     Rate and Rhythm: Normal rate and regular rhythm.     Heart sounds: No murmur heard.    No  friction rub. No gallop.  Pulmonary:     Effort: Pulmonary effort is normal. No respiratory distress.     Breath sounds: Normal breath sounds. No stridor. No rales.  Chest:     Chest wall: No tenderness.  Abdominal:  General: There is no distension.     Palpations: Abdomen is soft.     Tenderness: There is abdominal tenderness in the epigastric area. There is no guarding or rebound.  Musculoskeletal:        General: No tenderness.     Cervical back: Normal range of motion and neck supple.  Skin:    General: Skin is warm and dry.     Findings: No erythema or rash.  Neurological:     Mental Status: She is alert and oriented to person, place, and time.     ED Results and Treatments Labs (all labs ordered are listed, but only abnormal results are displayed) Labs Reviewed  COMPREHENSIVE METABOLIC PANEL - Abnormal; Notable for the following components:      Result Value   Glucose, Bld 113 (*)    All other components within normal limits  I-STAT CHEM 8, ED - Abnormal; Notable for the following components:   Glucose, Bld 109 (*)    All other components within normal limits  LIPASE, BLOOD  CBC WITH DIFFERENTIAL/PLATELET  URINALYSIS, ROUTINE W REFLEX MICROSCOPIC  TROPONIN I (HIGH SENSITIVITY)  TROPONIN I (HIGH SENSITIVITY)                                                                                                                         EKG  EKG Interpretation  Date/Time:  Saturday July 09 2022 02:15:50 EDT Ventricular Rate:  69 PR Interval:  170 QRS Duration: 99 QT Interval:  438 QTC Calculation: 470 R Axis:   -66 Text Interpretation: Sinus rhythm RSR' in V1 or V2, probably normal variant No significant change was found Confirmed by Addison Lank [phone]) on 07/09/2022 2:27:37 AM       Radiology DG Chest Portable 1 View  Result Date: 07/09/2022 CLINICAL DATA:  Chest pain EXAM: PORTABLE CHEST 1 VIEW COMPARISON:  08/06/2018 FINDINGS: Lungs are clear.  No pleural  effusion or pneumothorax. The heart is normal in size. IMPRESSION: No evidence of acute cardiopulmonary disease. Electronically Signed   By: Julian Hy M.D.   On: 07/09/2022 02:24    Medications Ordered in ED Medications  alum & mag hydroxide-simeth (MAALOX/MYLANTA) 200-200-20 MG/5ML suspension 30 mL (30 mLs Oral Not Given 07/09/22 0230)    And  lidocaine (XYLOCAINE) 2 % viscous mouth solution 15 mL (15 mLs Oral Not Given 07/09/22 0230)  Procedures Procedures  (including critical care time)  Medical Decision Making / ED Course   Medical Decision Making Amount and/or Complexity of Data Reviewed Labs: ordered. Decision-making details documented in ED Course. Radiology: ordered and independent interpretation performed. Decision-making details documented in ED Course. ECG/medicine tests: ordered and independent interpretation performed. Decision-making details documented in ED Course.  Risk OTC drugs. Prescription drug management. Decision regarding hospitalization.    Chest pain Not relieved by nitroglycerin Given patient's history of prior NSTEMI, will need to rule out ACS. We will also assess for evidence of pneumonia, pneumothorax. Given abdominal symptoms, also considering acid reflux, biliary disease/pancreatitis. Will assess for anemia.  Presentation not classic for aortic dissection. Low suspicion for pulmonary embolism.  EKG without acute ischemic changes. Serial troponins negative x2 Given the atypical nature of patient's pain.  ACS is unlikely.  CBC without leukocytosis or anemia CMP without electrolyte derangements or renal sufficiency.  No evidence of bili obstruction or pancreatitis.  Chest x-ray without evidence of pneumonia, pneumothorax, pulmonary edema or pleural effusions.  Patient remained chest pain-free except when  lying back.  She remained sitting upright but declined any treatment for acid reflux.  Requested multiple times to be discharged home.       Final Clinical Impression(s) / ED Diagnoses Final diagnoses:  Chest discomfort   The patient appears reasonably screened and/or stabilized for discharge and I doubt any other medical condition or other [facility] requiring further screening, evaluation, or treatment in the ED at this time. I have discussed the findings, Dx and Tx plan with the patient/family who expressed understanding and agree(s) with the plan. Discharge instructions discussed at length. The patient/family was given strict return precautions who verbalized understanding of the instructions. No further questions at time of discharge.  Disposition: Discharge  Condition: Good  ED Discharge Orders     None         Follow Up: Virgie Dad, MD [place] [phone]  Call  to schedule an appointment for close follow up            This chart was dictated using voice recognition software.  Despite best efforts to proofread,  errors can occur which can change the documentation meaning.    Fatima Blank, MD 07/09/22 [phone]

## 2022-07-18 ENCOUNTER — Encounter: Payer: Self-pay | Admitting: Internal Medicine

## 2022-07-18 NOTE — Telephone Encounter (Signed)
PA pending PA# [id number]

## 2022-07-25 NOTE — Telephone Encounter (Signed)
PA# [id number] Valid: 07/18/22-07/19/23

## 2022-07-25 NOTE — Telephone Encounter (Addendum)
Pt ready for scheduling on or after 07/18/22  Out-of-pocket cost due at time of visit: $30  Primary: [employer] Medicare Adv PPO Prolia co-insurance: $20 Admin fee co-insurance: $10  Secondary: n/a Prolia co-insurance:  Admin fee co-insurance:   Deductible: does not apply  Prior Auth: APPROVED PA# [id number] Valid: 07/18/22-07/19/23    ** This summary of benefits is an estimation of the patient's out-of-pocket cost. Exact cost may very based on individual plan coverage.

## 2022-07-28 NOTE — Telephone Encounter (Signed)
Patient will call back to schedule appointment for Prolia injection when she can check her schedule.

## 2022-08-16 ENCOUNTER — Other Ambulatory Visit: Payer: Self-pay | Admitting: Neurosurgery

## 2022-08-16 DIAGNOSIS — M25512 Pain in left shoulder: Secondary | ICD-10-CM

## 2022-09-10 ENCOUNTER — Ambulatory Visit
Admission: RE | Admit: 2022-09-10 | Discharge: 2022-09-10 | Disposition: A | Discharge status: Home / Self Care | Payer: Medicare Other | Source: Ambulatory Visit | Attending: Neurosurgery | Admitting: Neurosurgery

## 2022-09-10 DIAGNOSIS — M25512 Pain in left shoulder: Secondary | ICD-10-CM

## 2022-09-15 ENCOUNTER — Ambulatory Visit (INDEPENDENT_AMBULATORY_CARE_PROVIDER_SITE_OTHER): Payer: Medicare Other | Admitting: Physician Assistant

## 2022-09-15 ENCOUNTER — Encounter: Payer: Self-pay | Admitting: Physician Assistant

## 2022-09-15 DIAGNOSIS — M75122 Complete rotator cuff tear or rupture of left shoulder, not specified as traumatic: Secondary | ICD-10-CM | POA: Diagnosis not present

## 2022-09-15 NOTE — Progress Notes (Signed)
  HPI: [name] is a pleasant 81 year old female comes in today for left shoulder pain.  She is sent by Dr. Saintclair [name] due to left shoulder pain and arm pain.  She states pains been ongoing for the past 3 months no known injury.  She takes aspirin for the pain.  She does state that the pain is constant and she is unable to sleep on her left side due to the shoulder pain.  Notes pain with range of motion of the shoulder.  Occasionally she does have some pain that goes down her forearm and hand.  She reports she was told by the neurosurgeon here in town that they felt that the pain most likely was coming from her shoulder. MRI of the left shoulder is reviewed with the patient .  MRI left shoulder dated 09/10/2022 showed full-thickness supraspinatus tear measuring 8 mm with 2.5 cm of retraction.  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  Mild tendinosis of the long head of the biceps.  Moderate tendinosis infraspinatus tendon.  Review of systems: See HPI otherwise negative  Physical exam: General well-developed well-nourished female no acute distress mood affect appropriate.  Psych alert and oriented x 3.  Bilateral shoulders she has 5 out of 5 strength with internal rotation against resistance.  Left shoulder 3 out of 5 strength with external rotation against resistance.  Empty can test is positive on the left negative on the right.  Passively I can bring her to approximately 175 degrees of overhead motion with the left shoulder.  Impression: Left shoulder rotator cuff tear  Plan: Discussed with patient the findings on the MRI.  Recommended cortisone injection she defers secondary to her osteoporosis.  Also recommended physical therapy she defers.  Discussed with her that arthroscopic intervention with rotator cuff repair given the size of her tear and unfortunately her age most likely fail.  Therefore did not recommend arthroscopic repair of the rotator cuff.  Did discuss with her that  I felt the only intervention that would be appropriate surgical wise is reverse shoulder replacement if her pain becomes such that it is unbearable.  Discussed with her that I would speak with Dr. Marlou [name] here in the office and review her MRI with him upon his return give her a call back.  She is shown shoulder exercises to help maintain her mobility of the shoulder.  Questions were encouraged and answered at length.  Plan to call her next week after discussing with Dr. Marlou [name].

## 2022-10-06 ENCOUNTER — Other Ambulatory Visit: Payer: Medicare Other

## 2022-10-06 DIAGNOSIS — E782 Mixed hyperlipidemia: Secondary | ICD-10-CM

## 2022-10-06 DIAGNOSIS — I10 Essential (primary) hypertension: Secondary | ICD-10-CM

## 2022-10-06 DIAGNOSIS — E039 Hypothyroidism, unspecified: Secondary | ICD-10-CM

## 2022-10-06 DIAGNOSIS — I2510 Atherosclerotic heart disease of native coronary artery without angina pectoris: Secondary | ICD-10-CM

## 2022-10-06 LAB — CBC WITH DIFFERENTIAL/PLATELET
Absolute Monocytes: 409 cells/uL (ref 200–950)
Basophils Absolute: 31 cells/uL (ref 0–200)
Basophils Relative: 0.5 %
Eosinophils Absolute: 422 cells/uL (ref 15–500)
Eosinophils Relative: 6.8 %
HCT: 40.5 % (ref 35.0–45.0)
Hemoglobin: 13.4 g/dL (ref 11.7–15.5)
Lymphs Abs: 2393 cells/uL (ref 850–3900)
MCH: 30.7 pg (ref 27.0–33.0)
MCHC: 33.1 g/dL (ref 32.0–36.0)
MCV: 92.9 fL (ref 80.0–100.0)
MPV: 10.4 fL (ref 7.5–12.5)
Monocytes Relative: 6.6 %
Neutro Abs: 2945 cells/uL (ref 1500–7800)
Neutrophils Relative %: 47.5 %
Platelets: 280 10*3/uL (ref 140–400)
RBC: 4.36 10*6/uL (ref 3.80–5.10)
RDW: 12.4 % (ref 11.0–15.0)
Total Lymphocyte: 38.6 %
WBC: 6.2 10*3/uL (ref 3.8–10.8)

## 2022-10-06 LAB — COMPLETE METABOLIC PANEL WITH GFR
AG Ratio: 1.2 (calc) (ref 1.0–2.5)
ALT: 16 U/L (ref 6–29)
AST: 16 U/L (ref 10–35)
Albumin: 4 g/dL (ref 3.6–5.1)
Alkaline phosphatase (APISO): 64 U/L (ref 37–153)
BUN: 17 mg/dL (ref 7–25)
CO2: 27 mmol/L (ref 20–32)
Calcium: 9.2 mg/dL (ref 8.6–10.4)
Chloride: 105 mmol/L (ref 98–110)
Creat: 0.76 mg/dL (ref 0.60–0.95)
Globulin: 3.4 g/dL (calc) (ref 1.9–3.7)
Glucose, Bld: 67 mg/dL (ref 65–99)
Potassium: 4.2 mmol/L (ref 3.5–5.3)
Sodium: 140 mmol/L (ref 135–146)
Total Bilirubin: 0.6 mg/dL (ref 0.2–1.2)
Total Protein: 7.4 g/dL (ref 6.1–8.1)
eGFR: 79 mL/min/{1.73_m2} (ref >=60)

## 2022-10-07 LAB — LIPID PANEL
Cholesterol: 155 mg/dL (ref <200)
HDL: 52 mg/dL (ref >=50)
LDL Cholesterol (Calc): 84 mg/dL (calc)
Non-HDL Cholesterol (Calc): 103 mg/dL (calc) (ref <130)
Total CHOL/HDL Ratio: 3 (calc) (ref <5.0)
Triglycerides: 99 mg/dL (ref <150)

## 2022-10-07 LAB — TSH: TSH: 1.52 mIU/L (ref 0.40–4.50)

## 2022-10-12 ENCOUNTER — Encounter: Payer: Self-pay | Admitting: Internal Medicine

## 2022-10-12 ENCOUNTER — Non-Acute Institutional Stay: Payer: Medicare Other | Admitting: Internal Medicine

## 2022-10-12 VITALS — BP 128/82 | HR 88 | Temp 97.8°F | Resp 17 | Ht 63.5 in | Wt 149.6 lb

## 2022-10-12 DIAGNOSIS — M25512 Pain in left shoulder: Secondary | ICD-10-CM

## 2022-10-12 DIAGNOSIS — E782 Mixed hyperlipidemia: Secondary | ICD-10-CM

## 2022-10-12 DIAGNOSIS — K219 Gastro-esophageal reflux disease without esophagitis: Secondary | ICD-10-CM

## 2022-10-12 DIAGNOSIS — E039 Hypothyroidism, unspecified: Secondary | ICD-10-CM

## 2022-10-12 DIAGNOSIS — M542 Cervicalgia: Secondary | ICD-10-CM

## 2022-10-12 DIAGNOSIS — G8929 Other chronic pain: Secondary | ICD-10-CM

## 2022-10-12 DIAGNOSIS — F329 Major depressive disorder, single episode, unspecified: Secondary | ICD-10-CM

## 2022-10-12 DIAGNOSIS — M818 Other osteoporosis without current pathological fracture: Secondary | ICD-10-CM | POA: Diagnosis not present

## 2022-10-12 DIAGNOSIS — I2510 Atherosclerotic heart disease of native coronary artery without angina pectoris: Secondary | ICD-10-CM

## 2022-10-12 NOTE — Progress Notes (Signed)
Location:  [facility] (12)  Provider:   Code Status:  Goals of Care:     10/12/2022    1:28 PM  Advanced Directives  Does Patient Have a Medical Advance Directive? Yes  Type of Advance Directive Living will;Healthcare Power of Attorney  Does patient want to make changes to medical advance directive? No - Patient declined  Copy of [place] in Chart? No - copy requested     Chief Complaint  Patient presents with   Medical Management of Chronic Issues    Patient states she is here for a 4 month follow up. And states she is still having pain mostly in neck, shoulder and arm    HPI: Patient is a 82 y.o. female seen today for medical management of chronic diseases.    Lives in [place]  She has a history of Primary hyperparathyroidism s/p Parathyroidectomy in 2019 History of osteoporosis and vitamin D deficiency T score -3 in femoral neck and -2.7 in lumbar spine History of hypothyroidism B12 deficiency History of CAD per cardiologist stable with episodic symptoms of angina.  Managed conservatively.  Acute issues  Neck and left shoulder pain Patient was seen by neurosurgery Dr. Ernestene Kiel for severe cervical stenosis.  He does not think pain is only due to this and recommended her to see the orthopedics MRI of Left Shoulder shows Severe tendinosis of the supraspinatus tendon with a 8 mm full-thickness tear  Recommended Conservative management Does not want therapy or Steroid injections Wants to know if she can take tylenol Having lot of pain  Chest Discomfort Seen in ED Atypical pain Possible Reflux She is eating tums as needed Depression Due to Family issues   Past Medical History:  Diagnosis Date   Acute lumbar radiculopathy 04/2012   Anemia    history of   Anxiety    Atopic eczema    CAD (coronary artery disease)    Complication of anesthesia    Depression    Dyspnea    with activity   Essential  hypertension, benign    Hyperlipidemia, statin intolerant    Hypothyroidism    Myocardial infarction ([place]) 2009   NSTEMI (non-ST elevated myocardial infarction) ([place]) 2009   Osteoporosis, s/p bisphophonates x 7 years, stopped in 2011    Pneumonia    PONV (postoperative nausea and vomiting)    Sacrum and coccyx fracture [facility])     Past Surgical History:  Procedure Laterality Date   APPENDECTOMY     CARDIAC CATHETERIZATION  2009   CARPAL TUNNEL RELEASE Bilateral    cateract syrgery Bilateral 10/2018 and 11/2018   HIP SURGERY  1980s   due to infection    LAPAROSCOPIC HYSTERECTOMY     LEFT HEART CATHETERIZATION WITH CORONARY ANGIOGRAM N/A 11/18/2013   Procedure: LEFT HEART CATHETERIZATION WITH CORONARY ANGIOGRAM;  Surgeon: Blane Ohara, MD;  Location: [facility] CATH LAB;  Service: Cardiovascular;  Laterality: N/A;   Left interior parathyroiectomy     Dr. Harlow Asa 08-14-18   LUMBAR LAMINECTOMY/DECOMPRESSION MICRODISCECTOMY  04/17/2012   Procedure: LUMBAR LAMINECTOMY/DECOMPRESSION MICRODISCECTOMY 1 LEVEL;  Surgeon: Erline Levine, MD;  Location: [place] [facility] NEURO ORS;  Service: Neurosurgery;  Laterality: Left;  Left Lumbar five-Sacral one Microdiskectomy   PARATHYROIDECTOMY Left 08/14/2018   Procedure: LEFT INFERIOR PARATHYROIDECTOMY;  Surgeon: Armandina Gemma, MD;  Location: [facility] ORS;  Service: General;  Laterality: Left;   SPINE SURGERY  03/2018   Dr Vertell Limber   TONSILLECTOMY  TOOTH EXTRACTION     tooth implant      Allergies  Allergen Reactions   Shellfish Allergy Hives and Shortness Of Breath   Codeine Other (See Comments)    Headache   Imdur [Isosorbide Nitrate] Other (See Comments)    Confusion   Lanolin Itching   Latex Other (See Comments)    Unknown, allergy testing result   Pollen Extract    Statins     Simvastatin, Lipitor (both caused muscle aches); Crestor 5 mg and 10 mg daily (memory impairment), Livalo (throat and mouth itching), pravastatin 10 mg qd (memory)   Tramadol     Patient  said she had Mental break down    Cephalexin Itching and Rash   Ciprofloxacin Rash   Clindamycin/Lincomycin Rash   Erythromycin Itching and Rash   Penicillins Itching and Rash   Propylene Glycol Itching and Rash   Sulfa Antibiotics Itching and Rash   Tetracycline Itching and Rash    Outpatient Encounter Medications as of 10/12/2022  Medication Sig   amLODipine (NORVASC) 5 MG tablet Take 1 tablet (5 mg total) by mouth daily.   aspirin 81 MG tablet Take 81 mg by mouth daily.     atorvastatin (LIPITOR) 10 MG tablet TAKE 1 TABLET THREE TIMES  WEEKLY   Cholecalciferol (VITAMIN D) 2000 UNITS CAPS Take 1 capsule (2,000 Units total) by mouth daily.   clobetasol (TEMOVATE) 0.05 % external solution Apply 1 application. topically daily as needed. to scalp.   Coenzyme Q10 (CO Q-10) 300 MG CAPS Take 300 mg by mouth daily.    desonide (DESOWEN) 0.05 % ointment Apply 1 application. topically 2 (two) times daily as needed.   Desoximetasone (TOPICORT) 0.25 % ointment Apply 1 application topically 2 (two) times daily as needed (itching). Apply to hands.    EPINEPHrine 0.3 mg/0.3 mL IJ SOAJ injection Inject 0.3 mg into the muscle Once PRN.   ezetimibe (ZETIA) 10 MG tablet Take 1 tablet (10 mg total) by mouth daily.   loratadine (CLARITIN) 10 MG tablet Take 10 mg by mouth daily as needed for allergies.   Multiple Vitamins-Minerals (EYE VITAMINS) TABS Take 1 tablet by mouth 2 (two) times daily.    nitroGLYCERIN (NITROSTAT) 0.4 MG SL tablet PLACE 1 TABLET UNDER THE TONGUE AND ALLOW TO DISSOLVE EVERY 5 MINUTES AS NEEDED FOR CHEST PAIN AS DIRECTED BY YOUR DOCTOR.   SYNTHROID 75 MCG tablet Take 1 tablet (75 mcg total) by mouth daily.   [DISCONTINUED] alendronate (FOSAMAX) 70 MG tablet Take 1 tablet (70 mg total) by mouth every 7 (seven) days. Take with a full glass of water on an empty stomach. (Patient not taking: Reported on 06/08/2022)   No facility-administered encounter medications on file as of 10/12/2022.     Review of Systems:  Review of Systems  Constitutional:  Positive for activity change. Negative for appetite change.  HENT: Negative.    Respiratory:  Negative for cough and shortness of breath.   Cardiovascular:  Negative for leg swelling.  Gastrointestinal:  Negative for constipation.  Genitourinary: Negative.   Musculoskeletal:  Positive for arthralgias, myalgias, neck pain and neck stiffness. Negative for gait problem.  Skin: Negative.   Neurological:  Negative for dizziness and weakness.  Psychiatric/Behavioral:  Positive for dysphoric mood. Negative for confusion and sleep disturbance.     Health Maintenance  Topic Date Due   Medicare Annual Wellness (AWV)  05/17/2017   COVID-19 Vaccine (7 - 2023-24 season) 10/28/2022 (Originally 10/04/2022)   MAMMOGRAM  02/12/2023 (  Originally 01/02/2021)   DTaP/Tdap/Td (3 - Td or Tdap) 01/08/2023   DEXA SCAN  09/11/2023   Pneumonia Vaccine 65+ Years old  Completed   Zoster Vaccines- Shingrix  Completed   HPV VACCINES  Aged Out   INFLUENZA VACCINE  Discontinued    Physical Exam: Vitals:   10/12/22 1325  BP: 128/82  Pulse: 88  Resp: 17  Temp: 97.8 F (36.6 C)  TempSrc: Temporal  SpO2: 96%  Weight: 149 lb 9.6 oz (67.9 kg)  Height: 5' 3.5" (1.613 m)   Body mass index is 26.08 kg/m. Physical Exam Vitals reviewed.  Constitutional:      Appearance: Normal appearance.  HENT:     Head: Normocephalic.     Nose: Nose normal.     Mouth/Throat:     Mouth: Mucous membranes are moist.     Pharynx: Oropharynx is clear.  Eyes:     Pupils: Pupils are equal, round, and reactive to light.  Cardiovascular:     Rate and Rhythm: Normal rate and regular rhythm.     Pulses: Normal pulses.     Heart sounds: Normal heart sounds. No murmur heard. Pulmonary:     Effort: Pulmonary effort is normal.     Breath sounds: Normal breath sounds.  Abdominal:     General: Abdomen is flat. Bowel sounds are normal.     Palpations: Abdomen is soft.   Musculoskeletal:        General: No swelling.     Cervical back: Neck supple.  Skin:    General: Skin is warm.  Neurological:     General: No focal deficit present.     Mental Status: She is alert and oriented to person, place, and time.     Comments: Cannot raise her Left Arm due to Pain  Psychiatric:        Mood and Affect: Mood normal.        Thought Content: Thought content normal.     Labs reviewed: Basic Metabolic Panel: Recent Labs    02/11/22 0857 07/09/22 0204 07/09/22 0211 10/06/22 0830  NA 140 141 140 140  K 3.9 4.1 4.1 4.2  CL 104 106 103 105  CO2 29 26  --  27  GLUCOSE 61* 113* 109* 67  BUN '18 17 23 17  '$ CREATININE 0.90 0.90 0.80 0.76  CALCIUM 9.6 9.3  --  9.2  TSH 1.56  --   --  1.52   Liver Function Tests: Recent Labs    11/01/21 0746 02/11/22 0857 07/09/22 0204 10/06/22 0830  AST '20 16 18 16  '$ ALT '18 16 16 16  '$ ALKPHOS 73 48 54  --   BILITOT 0.5 0.5 0.3 0.6  PROT 7.5 7.4 6.7 7.4  ALBUMIN 4.5 4.2 3.5  --    Recent Labs    07/09/22 0204  LIPASE 33   No results for input(s): "AMMONIA" in the last 8760 hours. CBC: Recent Labs    11/01/21 0746 07/09/22 0204 07/09/22 0211 10/06/22 0830  WBC 6.4 7.3  --  6.2  NEUTROABS  --  3.5  --  2,945  HGB 14.1 12.6 12.6 13.4  HCT 41.6 38.0 37.0 40.5  MCV 93 95.0  --  92.9  PLT 264 237  --  280   Lipid Panel: Recent Labs    11/01/21 0746 10/06/22 0830  CHOL 165 155  HDL 52 52  LDLCALC 97 84  TRIG 85 99  CHOLHDL 3.2 3.0   Lab Results  Component  Value Date   HGBA1C 5.5 05/17/2016    Procedures since last visit: No results found.  Assessment/Plan 1. Neck pain Schedule Tylenol 1 int he morning 2 at night Try Voltaren Gel and Lidocaine patches Does not want Steroid injections due to h/o Osteoporosis Has follow up with Neurosurgery  2. Chronic left shoulder pain Follow with Ortho Did recommend Therapy Refusing right now  3. Age-related osteoporosis without fracture Does not want  to do Prolia Was on Fosamax which she has stopped Follows with Dr Renne Crigler  4. Acquired hypothyroidism TSH normal   5. Reactive depression Talked about trying Cymbalta Given Literature to review  6. Coronary artery disease involving native coronary artery of native heart, unspecified whether angina present Need to Follow with Cardiology intolerant to Isosorbide Takes Nitro prn Aspirin,Statin  7. Mixed hyperlipidemia On Statin and Zetia  8. Gastroesophageal reflux disease without esophagitis Try Prilosec 20 mg QD for 2 weeks 9 HTN On Norvasc   Labs/tests ordered:  * No order type specified * Next appt:  Visit date not found

## 2022-10-12 NOTE — Patient Instructions (Signed)
You can take Tylenol 500 mg in the morning and 2 tablets in night You can take extra Tylenol in afternoon if needed Try Voltaren Gel 2-3 times a day for Shoulder and Neck pain Prilosec OTC 20 mg Once a day for 2 weeks

## 2022-10-17 ENCOUNTER — Encounter: Payer: Self-pay | Admitting: Orthopedic Surgery

## 2022-10-17 ENCOUNTER — Ambulatory Visit: Payer: Self-pay

## 2022-10-17 ENCOUNTER — Ambulatory Visit: Payer: Medicare Other | Admitting: Orthopedic Surgery

## 2022-10-17 DIAGNOSIS — M19012 Primary osteoarthritis, left shoulder: Secondary | ICD-10-CM | POA: Diagnosis not present

## 2022-10-17 MED ORDER — METHYLPREDNISOLONE ACETATE 40 MG/ML IJ SUSP
40.0000 mg | INTRAMUSCULAR | Status: AC | PRN
  Administered 2022-10-17: 40 mg via INTRA_ARTICULAR

## 2022-10-17 MED ORDER — BUPIVACAINE HCL 0.5 % IJ SOLN
9.0000 mL | INTRAMUSCULAR | Status: AC | PRN
  Administered 2022-10-17: 9 mL via INTRA_ARTICULAR

## 2022-10-17 MED ORDER — LIDOCAINE HCL 1 % IJ SOLN
5.0000 mL | INTRAMUSCULAR | Status: AC | PRN
  Administered 2022-10-17: 5 mL

## 2022-10-17 NOTE — Progress Notes (Signed)
Office Visit Note   Patient: Victoria [name]           Date of Birth: 10/05/1940           MRN: [id number] Visit Date: 10/17/2022 Requested by: Virgie Dad, MD [address] [place],  [place] [id number] PCP: Virgie Dad, MD  Subjective: Chief Complaint  Patient presents with   Left Shoulder - Pain    HPI: Victoria [name] is a 82 y.o. female who presents to the office reporting left shoulder pain.  Been going on since August.  She has had MRI scan of the shoulder which shows moderate arthritis along with rotator cuff tear which is full-thickness with some retraction.  She also has severe left-sided C4-5 foraminal stenosis.  Symptoms are not necessarily better with the arm overhead.  Denies much in the way of mechanical symptoms.  Reports pressure/pain in the arm.  Hurts her to use.  Denies any symptoms that really go below the elbow..                ROS: All systems reviewed are negative as they relate to the chief complaint within the history of present illness.  Patient denies fevers or chills.  Assessment & Plan: Visit Diagnoses:  1. Glenohumeral arthritis, left     Plan: Impression is shoulder versus neck origin of pain.  Could really be a combination of both.  Clinically today it looks like it is likely more from the shoulder.  Plan is diagnostic ultrasound-guided left shoulder intra-articular injection.  We will see how she does with that intervention.  I think 2-week return with decision for or against cervical spine ESI is indicated at that time.  No real weakness or paresthesias in the arm today.  The weakness she has I think is attributable more to the rotator cuff tear as opposed to cervical radiculopathy.  Luke participated in the care of this patient  Follow-Up Instructions: No follow-ups on file.   Orders:  Orders Placed This Encounter  Procedures   US Guided Needle Placement - No Linked Charges   No orders of the defined types were placed in this  encounter.     Procedures: Large Joint Inj: L glenohumeral on 10/17/2022 11:44 AM Indications: diagnostic evaluation and pain Details: 18 G 1.5 in needle, ultrasound-guided posterior approach  Arthrogram: No  Medications: 9 mL bupivacaine 0.5 %; 40 mg methylPREDNISolone acetate 40 MG/ML; 5 mL lidocaine 1 % Outcome: tolerated well, no immediate complications Procedure, treatment alternatives, risks and benefits explained, specific risks discussed. Consent was given by the patient. Immediately prior to procedure a time out was called to verify the correct patient, procedure, equipment, support staff and site/side marked as required. Patient was prepped and draped in the usual sterile fashion.       Clinical Data: No additional findings.  Objective: Vital Signs: There were no vitals taken for this visit.  Physical Exam:  Constitutional: Patient appears well-developed HEENT:  Head: Normocephalic Eyes:EOM are normal Neck: Normal range of motion Cardiovascular: Normal rate Pulmonary/chest: Effort normal Neurologic: Patient is alert Skin: Skin is warm Psychiatric: Patient has normal mood and affect  Ortho Exam: Ortho exam demonstrates good cervical spine range of motion flexion chin to chest extension 40 degrees with rotation about 50 degrees bilaterally.  She does have a little coarseness on the right with internal/external rotation of the shoulder but on the left it is more significant.  Slight weakness to external rotation testing on the  left 4+ out of 5 compared to the right 5- out of 5.  Passive range of motion bilaterally is 80/105/170.  No paresthesias C5-T1.  She has pretty reasonable bicep strength bilaterally with no atrophy in the left arm compared to the right.  Specialty Comments:  No specialty comments available.  Imaging: No results found.   PMFS History: Patient Active Problem List   Diagnosis Date Noted   Multiple drug allergies 06/04/2019   Hx of  laminectomy L5-1 10/31/2018   History of primary hyperparathyroidism 06/27/2018   Chronic right-sided low back pain with right-sided sciatica 08/15/2017   Essential hypertension, benign 05/14/2011   Vitamin D deficiency 08/02/2010   Allergic rhinitis 07/30/2010   Osteoporosis 07/30/2010   CAD (coronary artery disease) 01/19/2009   Acquired hypothyroidism 12/04/2008   Mixed hyperlipidemia 12/04/2008   History of non-ST elevation myocardial infarction (NSTEMI) 12/04/2008   Age-related osteoporosis without fracture 12/04/2008   Past Medical History:  Diagnosis Date   Acute lumbar radiculopathy 04/2012   Anemia    history of   Anxiety    Atopic eczema    CAD (coronary artery disease)    Complication of anesthesia    Depression    Dyspnea    with activity   Essential hypertension, benign    Hyperlipidemia, statin intolerant    Hypothyroidism    Myocardial infarction [facility]) 2009   NSTEMI (non-ST elevated myocardial infarction) ([place]) 2009   Osteoporosis, s/p bisphophonates x 7 years, stopped in 2011    Pneumonia    PONV (postoperative nausea and vomiting)    Sacrum and coccyx fracture ([facility])     Family History  Problem Relation Age of Onset   Colon cancer Mother 62       1st degree   Stroke Father 30   Stroke Other        Grandfather- grandmother    Past Surgical History:  Procedure Laterality Date   APPENDECTOMY     CARDIAC CATHETERIZATION  2009   CARPAL TUNNEL RELEASE Bilateral    cateract syrgery Bilateral 10/2018 and 11/2018   HIP SURGERY  1980s   due to infection    LAPAROSCOPIC HYSTERECTOMY     LEFT HEART CATHETERIZATION WITH CORONARY ANGIOGRAM N/A 11/18/2013   Procedure: LEFT HEART CATHETERIZATION WITH CORONARY ANGIOGRAM;  Surgeon: Blane Ohara, MD;  Location[facility] CATH LAB;  Service: Cardiovascular;  Laterality: N/A;   Left interior parathyroiectomy     Dr. Harlow Asa 08-14-18   LUMBAR LAMINECTOMY/DECOMPRESSION MICRODISCECTOMY  04/17/2012   Procedure: LUMBAR  LAMINECTOMY/DECOMPRESSION MICRODISCECTOMY 1 LEVEL;  Surgeon: Erline Levine, MD;  Location: [place] NEURO ORS;  Service: Neurosurgery;  Laterality: Left;  Left Lumbar five-Sacral one Microdiskectomy   PARATHYROIDECTOMY Left 08/14/2018   Procedure: LEFT INFERIOR PARATHYROIDECTOMY;  Surgeon: Armandina Gemma, MD;  Location: [facility] ORS;  Service: General;  Laterality: Left;   SPINE SURGERY  03/2018   Dr Vertell Limber   TONSILLECTOMY     TOOTH EXTRACTION     tooth implant     Social History   Occupational History   Occupation: retired  Tobacco Use   Smoking status: Never   Smokeless tobacco: Never  Vaping Use   Vaping Use: Never used  Substance and Sexual Activity   Alcohol use: Yes    Alcohol/week: 1.0 standard drink of alcohol    Types: 1 Glasses of wine per week    Comment: occasional glass of wine   Drug use: No   Sexual activity: Not Currently

## 2022-10-25 ENCOUNTER — Other Ambulatory Visit: Payer: Self-pay | Admitting: Cardiovascular Disease

## 2022-10-25 DIAGNOSIS — E782 Mixed hyperlipidemia: Secondary | ICD-10-CM

## 2022-10-25 DIAGNOSIS — I10 Essential (primary) hypertension: Secondary | ICD-10-CM

## 2022-10-25 DIAGNOSIS — I25119 Atherosclerotic heart disease of native coronary artery with unspecified angina pectoris: Secondary | ICD-10-CM

## 2022-10-25 DIAGNOSIS — R0609 Other forms of dyspnea: Secondary | ICD-10-CM

## 2022-10-25 DIAGNOSIS — I25118 Atherosclerotic heart disease of native coronary artery with other forms of angina pectoris: Secondary | ICD-10-CM

## 2022-10-31 ENCOUNTER — Ambulatory Visit: Payer: Medicare Other | Admitting: Orthopedic Surgery

## 2022-10-31 DIAGNOSIS — M19012 Primary osteoarthritis, left shoulder: Secondary | ICD-10-CM

## 2022-11-01 ENCOUNTER — Encounter: Payer: Self-pay | Admitting: Orthopedic Surgery

## 2022-11-01 NOTE — Progress Notes (Unsigned)
Office Visit Note   Patient: Victoria [name]           Date of Birth: [birth year]           MRN: [id number] Visit Date: 10/31/2022 Requested by: Virgie Dad, MD [address] [place],  [place] [address] PCP: Virgie Dad, MD  Subjective: Chief Complaint  Patient presents with   Left Shoulder - Follow-up    HPI: Victoria [name] is a 82 y.o. female who presents to the office reporting left shoulder pain.  Had left glenohumeral joint injection 10/17/2022.  Doing very well.  Has gotten great relief.  No radicular pain.  She is pending cervical spine ESI but with her relief from the shoulder injection and she wants to hold off on that.  Takes Tylenol occasionally for neck pain.  She does have a concern about osteoporosis and cortisone injections..                ROS: All systems reviewed are negative as they relate to the chief complaint within the history of present illness.  Patient denies fevers or chills.  Assessment & Plan: Visit Diagnoses:  1. Glenohumeral arthritis, left     Plan: Impression is very good relief from shoulder pain following left glenohumeral joint injection.  Osteoporosis risk from 2 injections/year of cortisone into the joint is negligible.  She will follow-up as needed when her symptoms recur.  Follow-Up Instructions: No follow-ups on file.   Orders:  No orders of the defined types were placed in this encounter.  No orders of the defined types were placed in this encounter.     Procedures: No procedures performed   Clinical Data: No additional findings.  Objective: Vital Signs: There were no vitals taken for this visit.  Physical Exam:  Constitutional: Patient appears well-developed HEENT:  Head: Normocephalic Eyes:EOM are normal Neck: Normal range of motion Cardiovascular: Normal rate Pulmonary/chest: Effort normal Neurologic: Patient is alert Skin: Skin is warm Psychiatric: Patient has normal mood and affect  Ortho Exam:  Ortho exam demonstrates excellent range of motion of the left shoulder joint.  She has forward flexion and abduction both above 90 degrees with good rotator cuff strength.  Deltoid is functional.  Motor or sensory function of the hand is intact.  Specialty Comments:  No specialty comments available.  Imaging: No results found.   PMFS History: Patient Active Problem List   Diagnosis Date Noted   Multiple drug allergies 06/04/2019   Hx of laminectomy L5-1 10/31/2018   History of primary hyperparathyroidism 06/27/2018   Chronic right-sided low back pain with right-sided sciatica 08/15/2017   Essential hypertension, benign 05/14/2011   Vitamin D deficiency 08/02/2010   Allergic rhinitis 07/30/2010   Osteoporosis 07/30/2010   CAD (coronary artery disease) 01/19/2009   Acquired hypothyroidism 12/04/2008   Mixed hyperlipidemia 12/04/2008   History of non-ST elevation myocardial infarction (NSTEMI) 12/04/2008   Age-related osteoporosis without fracture 12/04/2008   Past Medical History:  Diagnosis Date   Acute lumbar radiculopathy 04/2012   Anemia    history of   Anxiety    Atopic eczema    CAD (coronary artery disease)    Complication of anesthesia    Depression    Dyspnea    with activity   Essential hypertension, benign    Hyperlipidemia, statin intolerant    Hypothyroidism    Myocardial infarction [facility]) 2009   NSTEMI (non-ST elevated myocardial infarction) ([place]) 2009   Osteoporosis, s/p bisphophonates x 7  years, stopped in 2011    Pneumonia    PONV (postoperative nausea and vomiting)    Sacrum and coccyx fracture ([facility]CC)     Family History  Problem Relation Age of Onset   Colon cancer Mother 4       1st degree   Stroke Father 51   Stroke Other        Grandfather- grandmother    Past Surgical History:  Procedure Laterality Date   APPENDECTOMY     CARDIAC CATHETERIZATION  2009   CARPAL TUNNEL RELEASE Bilateral    cateract syrgery Bilateral 10/2018 and 11/2018    HIP SURGERY  1980s   due to infection    LAPAROSCOPIC HYSTERECTOMY     LEFT HEART CATHETERIZATION WITH CORONARY ANGIOGRAM N/A 11/18/2013   Procedure: LEFT HEART CATHETERIZATION WITH CORONARY ANGIOGRAM;  Surgeon: Blane Ohara, MD;  Location[facility] CATH LAB;  Service: Cardiovascular;  Laterality: N/A;   Left interior parathyroiectomy     Dr. Harlow Asa 08-14-18   LUMBAR LAMINECTOMY/DECOMPRESSION MICRODISCECTOMY  04/17/2012   Procedure: LUMBAR LAMINECTOMY/DECOMPRESSION MICRODISCECTOMY 1 LEVEL;  Surgeon: Erline Levine, MD;  Location: [place] NEURO ORS;  Service: Neurosurgery;  Laterality: Left;  Left Lumbar five-Sacral one Microdiskectomy   PARATHYROIDECTOMY Left 08/14/2018   Procedure: LEFT INFERIOR PARATHYROIDECTOMY;  Surgeon: Armandina Gemma, MD;  Location: [facility] ORS;  Service: General;  Laterality: Left;   SPINE SURGERY  03/2018   Dr Vertell Limber   TONSILLECTOMY     TOOTH EXTRACTION     tooth implant     Social History   Occupational History   Occupation: retired  Tobacco Use   Smoking status: Never   Smokeless tobacco: Never  Vaping Use   Vaping Use: Never used  Substance and Sexual Activity   Alcohol use: Yes    Alcohol/week: 1.0 standard drink of alcohol    Types: 1 Glasses of wine per week    Comment: occasional glass of wine   Drug use: No   Sexual activity: Not Currently

## 2022-12-08 ENCOUNTER — Encounter: Payer: Self-pay | Admitting: Radiology

## 2022-12-14 ENCOUNTER — Encounter: Payer: Self-pay | Admitting: Internal Medicine

## 2022-12-14 DIAGNOSIS — E039 Hypothyroidism, unspecified: Secondary | ICD-10-CM

## 2022-12-14 MED ORDER — SYNTHROID 75 MCG PO TABS: 75.0000 ug | ORAL_TABLET | Freq: Every day | ORAL | 3 refills | Status: DC

## 2022-12-14 MED ORDER — CLOBETASOL PROPIONATE 0.05 % EX SOLN: 1.0000 | Freq: Every day | CUTANEOUS | 1 refills | Status: AC | PRN

## 2022-12-14 NOTE — Addendum Note (Signed)
Addended by: Logan Bores on: 12/14/2022 02:30 PM   Modules accepted: Orders

## 2022-12-14 NOTE — Telephone Encounter (Signed)
Spoke with patient over the phone to confirm pharmacy, rx's sent to mail order as requested.

## 2023-01-13 ENCOUNTER — Other Ambulatory Visit: Payer: Self-pay | Admitting: Cardiovascular Disease

## 2023-01-13 DIAGNOSIS — R0609 Other forms of dyspnea: Secondary | ICD-10-CM

## 2023-01-13 DIAGNOSIS — I25119 Atherosclerotic heart disease of native coronary artery with unspecified angina pectoris: Secondary | ICD-10-CM

## 2023-01-13 DIAGNOSIS — E782 Mixed hyperlipidemia: Secondary | ICD-10-CM

## 2023-01-13 DIAGNOSIS — I25118 Atherosclerotic heart disease of native coronary artery with other forms of angina pectoris: Secondary | ICD-10-CM

## 2023-01-13 DIAGNOSIS — I10 Essential (primary) hypertension: Secondary | ICD-10-CM

## 2023-01-19 ENCOUNTER — Encounter: Payer: Self-pay | Admitting: Cardiovascular Disease

## 2023-01-19 ENCOUNTER — Ambulatory Visit: Payer: Medicare Other | Attending: Cardiovascular Disease | Admitting: Cardiovascular Disease

## 2023-01-19 VITALS — BP 134/64 | HR 86 | Ht 63.5 in | Wt 144.6 lb

## 2023-01-19 DIAGNOSIS — I10 Essential (primary) hypertension: Secondary | ICD-10-CM

## 2023-01-19 DIAGNOSIS — E782 Mixed hyperlipidemia: Secondary | ICD-10-CM

## 2023-01-19 DIAGNOSIS — I25118 Atherosclerotic heart disease of native coronary artery with other forms of angina pectoris: Secondary | ICD-10-CM

## 2023-01-19 NOTE — Patient Instructions (Signed)
Medication Instructions:  Your physician recommends that you continue on your current medications as directed. Please refer to the Current Medication list given to you today.  *If you need a refill on your cardiac medications before your next appointment, please call your pharmacy*   Lab Work: NONE If you have labs (blood work) drawn today and your tests are completely normal, you will receive your results only by: MyChart Message (if you have MyChart) OR A paper copy in the mail If you have any lab test that is abnormal or we need to change your treatment, we will call you to review the results.   Testing/Procedures: NONE   Follow-Up: At [facility], you and your health needs are our priority.  As part of our continuing mission to provide you with exceptional heart care, we have created designated Provider Care Teams.  These Care Teams include your primary Cardiologist (physician) and Advanced Practice Providers (APPs -  Physician Assistants and Nurse Practitioners) who all work together to provide you with the care you need, when you need it.  We recommend signing up for the patient portal called "MyChart".  Sign up information is provided on this After Visit Summary.  MyChart is used to connect with patients for Virtual Visits (Telemedicine).  Patients are able to view lab/test results, encounter notes, upcoming appointments, etc.  Non-urgent messages can be sent to your provider as well.   To learn more about what you can do with MyChart, go to [email or web address].    Your next appointment:   1 year(s)  Provider:   Michael Cooper, MD      

## 2023-01-19 NOTE — Progress Notes (Signed)
Cardiology Office Note:    Date:  01/19/2023   ID:  Audrea Muscat, DOB [birth year], MRN [id number]  PCP:  Mahlon Gammon, MD   [facility] Providers Cardiologist:  None     Referring MD: Mahlon Gammon, MD   Chief Complaint  Patient presents with   Coronary Artery Disease    History of Present Illness:    Victoria [name] is a 82 y.o. female with a hx of coronary artery disease who initially presented in 2009 with non-ST elevation MI treated with balloon angioplasty of a small circumflex branch.  In 2016 she developed progressive angina and underwent repeat catheterization with demonstration of moderate diffuse mid LAD stenosis and borderline FFR analysis.  Medical therapy was recommended.  She had a nuclear scan in May 2019 demonstrating no ischemia and normal LVEF of 75%. She has had chronic angina over the years.  Last year she had complained of shortness of breath and an echocardiogram was ordered demonstrating normal LVEF, indeterminate diastolic parameters, small pericardial effusion, and aortic valve calcification without stenosis.  The patient is here alone today.  She is doing well.  She actually has had significant improvement in her chest discomfort and shortness of breath.  She does not report any significant symptoms over the last several months.  She is feeling quite well.  She is dealing with some problems with her neck and left shoulder related to arthritis. Today, she denies symptoms of palpitations, chest pain, shortness of breath, orthopnea, PND, lower extremity edema, dizziness, or syncope.   Past Medical History:  Diagnosis Date   Acute lumbar radiculopathy 04/2012   Anemia    history of   Anxiety    Atopic eczema    CAD (coronary artery disease)    Complication of anesthesia    Depression    Dyspnea    with activity   Essential hypertension, benign    Hyperlipidemia, statin intolerant    Hypothyroidism    Myocardial infarction 2009    NSTEMI (non-ST elevated myocardial infarction) 2009   Osteoporosis, s/p bisphophonates x 7 years, stopped in 2011    Pneumonia    PONV (postoperative nausea and vomiting)    Sacrum and coccyx fracture     Past Surgical History:  Procedure Laterality Date   APPENDECTOMY     CARDIAC CATHETERIZATION  2009   CARPAL TUNNEL RELEASE Bilateral    cateract syrgery Bilateral 10/2018 and 11/2018   HIP SURGERY  1980s   due to infection    LAPAROSCOPIC HYSTERECTOMY     LEFT HEART CATHETERIZATION WITH CORONARY ANGIOGRAM N/A 11/18/2013   Procedure: LEFT HEART CATHETERIZATION WITH CORONARY ANGIOGRAM;  Surgeon: Micheline Chapman, MD;  Location: [facility] CATH LAB;  Service: Cardiovascular;  Laterality: N/A;   Left interior parathyroiectomy     Dr. Gerrit Friends 08-14-18   LUMBAR LAMINECTOMY/DECOMPRESSION MICRODISCECTOMY  04/17/2012   Procedure: LUMBAR LAMINECTOMY/DECOMPRESSION MICRODISCECTOMY 1 LEVEL;  Surgeon: Maeola Harman, MD;  Location: [facility] NEURO ORS;  Service: Neurosurgery;  Laterality: Left;  Left Lumbar five-Sacral one Microdiskectomy   PARATHYROIDECTOMY Left 08/14/2018   Procedure: LEFT INFERIOR PARATHYROIDECTOMY;  Surgeon: Darnell Level, MD;  Location: [facility] ORS;  Service: General;  Laterality: Left;   SPINE SURGERY  03/2018   Dr Venetia Maxon   TONSILLECTOMY     TOOTH EXTRACTION     tooth implant      Current Medications: Current Meds  Medication Sig   amLODipine (NORVASC) 5 MG tablet Take 1 tablet (5 mg total) by mouth  daily. Please keep scheduled appointment for future refills. Thank you.   aspirin 81 MG tablet Take 81 mg by mouth daily.     atorvastatin (LIPITOR) 10 MG tablet TAKE 1 TABLET THREE TIMES  WEEKLY. Please keep scheduled appointment for future refills. Thank you.   Cholecalciferol (VITAMIN D) 2000 UNITS CAPS Take 1 capsule (2,000 Units total) by mouth daily.   clobetasol (TEMOVATE) 0.05 % external solution Apply 1 Application topically daily as needed. to scalp.   Coenzyme Q10 (CO Q-10) 300 MG CAPS  Take 300 mg by mouth daily.    Desoximetasone (TOPICORT) 0.25 % ointment Apply 1 application topically 2 (two) times daily as needed (itching). Apply to hands.    EPINEPHrine 0.3 mg/0.3 mL IJ SOAJ injection Inject 0.3 mg into the muscle Once PRN.   ezetimibe (ZETIA) 10 MG tablet TAKE 1 TABLET DAILY   loratadine (CLARITIN) 10 MG tablet Take 10 mg by mouth daily as needed for allergies.   Multiple Vitamins-Minerals (EYE VITAMINS) TABS Take 1 tablet by mouth 2 (two) times daily.    nitroGLYCERIN (NITROSTAT) 0.4 MG SL tablet PLACE 1 TABLET UNDER THE TONGUE AND ALLOW TO DISSOLVE EVERY 5 MINUTES AS NEEDED FOR CHEST PAIN AS DIRECTED BY YOUR DOCTOR.   SYNTHROID 75 MCG tablet Take 1 tablet (75 mcg total) by mouth daily.     Allergies:   Shellfish allergy, Codeine, Imdur [isosorbide nitrate], Lanolin, Latex, Pollen extract, Statins, Tramadol, Cephalexin, Ciprofloxacin, Clindamycin/lincomycin, Erythromycin, Penicillins, Propylene glycol, Sulfa antibiotics, and Tetracycline   Social History   Socioeconomic History   Marital status: Married    Spouse name: Not on file   Number of children: 2   Years of education: Not on file   Highest education level: Not on file  Occupational History   Occupation: retired  Tobacco Use   Smoking status: Never   Smokeless tobacco: Never  Vaping Use   Vaping Use: Never used  Substance and Sexual Activity   Alcohol use: Yes    Alcohol/week: 1.0 standard drink of alcohol    Types: 1 Glasses of wine per week    Comment: occasional glass of wine   Drug use: No   Sexual activity: Not Currently  Other Topics Concern   Not on file  Social History Narrative   Not on file   Social Determinants of Health   Financial Resource Strain: Not on file  Food Insecurity: Not on file  Transportation Needs: Not on file  Physical Activity: Not on file  Stress: Not on file  Social Connections: Not on file     Family History: The patient's family history includes Colon  cancer (age of onset: 15) in her mother; Stroke in an other family member; Stroke (age of onset: 35) in her father.  ROS:   Please see the history of present illness.    All other systems reviewed and are negative.  EKGs/Labs/Other Studies Reviewed:    The following studies were reviewed today: Cardiac Studies & Procedures     STRESS TESTS  MYOCARDIAL PERFUSION IMAGING 02/20/2018  Narrative  Nuclear stress EF: 75%.  There was no ST segment deviation noted during stress.  The left ventricular ejection fraction is hyperdynamic (>65%).  Poor quality study with diffusely low counts in apex and inferior wall on all images No evidence of ischemia EF 75%   ECHOCARDIOGRAM  ECHOCARDIOGRAM COMPLETE 11/01/2021  Narrative ECHOCARDIOGRAM REPORT    Patient Name:   Victoria [name] Date of Exam: 11/01/2021 Medical Rec #:  [id number]           Height:       63.5 in Accession #:    [id number]          Weight:       147.2 lb Date of Birth:  03-09-1941            BSA:          1.707 m Patient Age:    80 years            BP:           120/70 mmHg Patient Gender: F                   HR:           67 bpm. Exam Location:  [address]  Procedure: 2D Echo, Cardiac Doppler and Color Doppler  Indications:    R06.9 DOE  History:        Patient has no prior history of Echocardiogram examinations. CAD and Previous Myocardial Infarction; Risk Factors:Hypertension and Dyslipidemia. Hypothyroidism. Anemia.  Sonographer:    Cathie Beams RCS Referring Phys: [phone]   Sonographer Comments: This was a technically difficult study, patient was experiencing left hip discomfort. She repositioned herself for comfort and alternatively, a pillow was placed under her left hip. IMPRESSIONS   1. Left ventricular ejection fraction, by estimation, is 55 to 60%. The left ventricle has normal function. The left ventricle has no regional wall motion abnormalities. There is mild asymmetric left  ventricular hypertrophy. Left ventricular diastolic parameters are indeterminate. Elevated left atrial pressure. 2. Right ventricular systolic function is normal. The right ventricular size is normal. Tricuspid regurgitation signal is inadequate for assessing PA pressure. 3. A small pericardial effusion is present. The pericardial effusion is anterior to the right ventricle. 4. The mitral valve is normal in structure. Trivial mitral valve regurgitation. No evidence of mitral stenosis. 5. Mildly reduced LV stroke volume index. The aortic valve is tricuspid. There is moderate calcification of the aortic valve. There is moderate thickening of the aortic valve. Aortic valve regurgitation is mild. Aortic valve sclerosis/calcification is present. Mean graidnet 2.63 mm Hg, assessent only in the A5c view. 6. The inferior vena cava is normal in size with greater than 50% respiratory variability, suggesting right atrial pressure of 3 mmHg.  Conclusion(s)/Recommendation(s): Discrepancy between degree of aortic calcium and gradient. Aortic valve calcium scoring may be useful if clinically indicated.  FINDINGS Left Ventricle: Left ventricular ejection fraction, by estimation, is 55 to 60%. The left ventricle has normal function. The left ventricle has no regional wall motion abnormalities. The left ventricular internal cavity size was small. There is mild asymmetric left ventricular hypertrophy. Left ventricular diastolic parameters are indeterminate. Elevated left atrial pressure.  Right Ventricle: The right ventricular size is normal. No increase in right ventricular wall thickness. Right ventricular systolic function is normal. Tricuspid regurgitation signal is inadequate for assessing PA pressure.  Left Atrium: Left atrial size was normal in size.  Right Atrium: Right atrial size was not well visualized.  Pericardium: A small pericardial effusion is present. The pericardial effusion is anterior to the  right ventricle. Presence of epicardial fat layer.  Mitral Valve: The mitral valve is normal in structure. Trivial mitral valve regurgitation. No evidence of mitral valve stenosis.  Tricuspid Valve: The tricuspid valve is normal in structure. Tricuspid valve regurgitation is not demonstrated. No evidence of tricuspid stenosis.  Aortic Valve: Mildly reduced LV stroke volume  index. The aortic valve is tricuspid. There is moderate calcification of the aortic valve. There is moderate thickening of the aortic valve. Aortic valve regurgitation is mild. Aortic valve sclerosis/calcification is present, without any evidence of aortic stenosis.  Pulmonic Valve: The pulmonic valve was not well visualized. Pulmonic valve regurgitation is not visualized. No evidence of pulmonic stenosis.  Aorta: The aortic root and ascending aorta are structurally normal, with no evidence of dilitation.  Venous: The inferior vena cava is normal in size with greater than 50% respiratory variability, suggesting right atrial pressure of 3 mmHg.  IAS/Shunts: No atrial level shunt detected by color flow Doppler.   LEFT VENTRICLE PLAX 2D LVIDd:         3.40 cm   Diastology LVIDs:         2.40 cm   LV e' medial:    2.75 cm/s LV PW:         0.90 cm   LV E/e' medial:  25.2 LV IVS:        1.20 cm   LV e' lateral:   2.95 cm/s LVOT diam:     2.10 cm   LV E/e' lateral: 23.5 LV SV:         59 LV SV Index:   34 LVOT Area:     3.46 cm   RIGHT VENTRICLE RV S prime:     14.40 cm/s  LEFT ATRIUM             Index LA diam:        3.10 cm 1.82 cm/m LA Vol (A2C):   26.7 ml 15.64 ml/m LA Vol (A4C):   23.4 ml 13.70 ml/m LA Biplane Vol: 26.8 ml 15.70 ml/m AORTIC VALVE LVOT Vmax:   83.00 cm/s LVOT Vmean:  53.100 cm/s LVOT VTI:    0.169 m  AORTA Ao Root diam: 3.10 cm Ao Asc diam:  3.50 cm  MITRAL VALVE MV Area (PHT): 2.47 cm     SHUNTS MV Decel Time: 307 msec     Systemic VTI:  0.17 m MV E velocity: 69.20 cm/s    Systemic Diam: 2.10 cm MV A velocity: 104.00 cm/s MV E/A ratio:  0.67  Riley Lam MD Electronically signed by Riley Lam MD Signature Date/Time: 11/01/2021/11:15:49 AM    Final              EKG:  EKG is not ordered today.    Recent Labs: 10/06/2022: ALT 16; BUN 17; Creat 0.76; Hemoglobin 13.4; Platelets 280; Potassium 4.2; Sodium 140; TSH 1.52  Recent Lipid Panel    Component Value Date/Time   CHOL 155 10/06/2022 0830   CHOL 165 11/01/2021 0746   TRIG 99 10/06/2022 0830   HDL 52 10/06/2022 0830   HDL 52 11/01/2021 0746   CHOLHDL 3.0 10/06/2022 0830   VLDL 18.8 12/24/2019 0856   LDLCALC 84 10/06/2022 0830     Risk Assessment/Calculations:                Physical Exam:    VS:  BP 134/64   Pulse 86   Ht 5' 3.5" (1.613 m)   Wt 144 lb 9.6 oz (65.6 kg)   SpO2 96%   BMI 25.21 kg/m     Wt Readings from Last 3 Encounters:  01/19/23 144 lb 9.6 oz (65.6 kg)  10/12/22 149 lb 9.6 oz (67.9 kg)  06/08/22 146 lb 4.8 oz (66.4 kg)     GEN:  Well nourished, well developed in no  acute distress HEENT: Normal NECK: No JVD; No carotid bruits LYMPHATICS: No lymphadenopathy CARDIAC: RRR, no murmurs, rubs, gallops RESPIRATORY:  Clear to auscultation without rales, wheezing or rhonchi  ABDOMEN: Soft, non-tender, non-distended MUSCULOSKELETAL:  No edema; No deformity  SKIN: Warm and dry NEUROLOGIC:  Alert and oriented x 3 PSYCHIATRIC:  Normal affect   ASSESSMENT:    1. Coronary artery disease of native artery of native heart with stable angina pectoris   2. Mixed hyperlipidemia   3. Essential hypertension    PLAN:    In order of problems listed above:  The patient is clinically stable with minimal symptoms at this time.  She will continue on amlodipine for antianginal therapy, aspirin, and a statin drug at low-dose due to issues with tolerance. Treated with atorvastatin 3 days/week and ezetimibe.  Lipids reviewed with cholesterol 155, HDL 52, LDL  84.  She has had difficulty tolerating any intensive lipid lowering.  Will continue her current medicines as she is on a good regimen at present and I think this is the best we can do with the medicine she can tolerate. Blood pressure well-controlled on amlodipine.  Continue the same.  Creatinine is 0.76.     Medication Adjustments/Labs and Tests Ordered: Current medicines are reviewed at length with the patient today.  Concerns regarding medicines are outlined above.  No orders of the defined types were placed in this encounter.  No orders of the defined types were placed in this encounter.   Patient Instructions  Medication Instructions:  Your physician recommends that you continue on your current medications as directed. Please refer to the Current Medication list given to you today.  *If you need a refill on your cardiac medications before your next appointment, please call your pharmacy*   Lab Work: NONE If you have labs (blood work) drawn today and your tests are completely normal, you will receive your results only by: MyChart Message (if you have MyChart) OR A paper copy in the mail If you have any lab test that is abnormal or we need to change your treatment, we will call you to review the results.   Testing/Procedures: NONE   Follow-Up: At [facility], you and your health needs are our priority.  As part of our continuing mission to provide you with exceptional heart care, we have created designated Provider Care Teams.  These Care Teams include your primary Cardiologist (physician) and Advanced Practice Providers (APPs -  Physician Assistants and Nurse Practitioners) who all work together to provide you with the care you need, when you need it.  We recommend signing up for the patient portal called "MyChart".  Sign up information is provided on this After Visit Summary.  MyChart is used to connect with patients for Virtual Visits (Telemedicine).  Patients are  able to view lab/test results, encounter notes, upcoming appointments, etc.  Non-urgent messages can be sent to your provider as well.   To learn more about what you can do with MyChart, go to [email or web address].    Your next appointment:   1 year(s)  Provider:   Tonny Bollman, MD     Signed, Tonny Bollman, MD  01/19/2023 4:50 PM    [facility]

## 2023-02-01 ENCOUNTER — Encounter: Payer: Self-pay | Admitting: Internal Medicine

## 2023-02-01 ENCOUNTER — Non-Acute Institutional Stay: Payer: Medicare Other | Admitting: Internal Medicine

## 2023-02-01 VITALS — BP 130/80 | HR 98 | Temp 97.7°F | Resp 17 | Ht 63.5 in | Wt 148.6 lb

## 2023-02-01 DIAGNOSIS — E782 Mixed hyperlipidemia: Secondary | ICD-10-CM

## 2023-02-01 DIAGNOSIS — M818 Other osteoporosis without current pathological fracture: Secondary | ICD-10-CM | POA: Diagnosis not present

## 2023-02-01 DIAGNOSIS — G8929 Other chronic pain: Secondary | ICD-10-CM

## 2023-02-01 DIAGNOSIS — E039 Hypothyroidism, unspecified: Secondary | ICD-10-CM | POA: Diagnosis not present

## 2023-02-01 DIAGNOSIS — I2510 Atherosclerotic heart disease of native coronary artery without angina pectoris: Secondary | ICD-10-CM

## 2023-02-01 DIAGNOSIS — M542 Cervicalgia: Secondary | ICD-10-CM | POA: Diagnosis not present

## 2023-02-01 DIAGNOSIS — F329 Major depressive disorder, single episode, unspecified: Secondary | ICD-10-CM

## 2023-02-01 DIAGNOSIS — M25512 Pain in left shoulder: Secondary | ICD-10-CM

## 2023-02-01 NOTE — Progress Notes (Signed)
Location:  [facility] of Service:  Clinic (12)  Provider:   Code Status:  Goals of Care:     02/01/2023   10:24 AM  Advanced Directives  Does Patient Have a Medical Advance Directive? Yes  Type of Estate agent of [place];Living will  Does patient want to make changes to medical advance directive? No - Patient declined  Copy of Healthcare Power of Attorney in Chart? No - copy requested     Chief Complaint  Patient presents with   Medical Management of Chronic Issues    Patient is being seen for a 4 month follow up.   Health Maintenance    Dicussed the need for Tdap and AWV    HPI: Patient is a 82 y.o. female seen today for medical management of chronic diseases.   Lives in [facility] [address]   She has a history of Primary hyperparathyroidism s/p Parathyroidectomy in 2019 History of osteoporosis and vitamin D deficiency T score -3 in femoral neck and -2.7 in lumbar spine History of hypothyroidism B12 deficiency History of CAD per cardiologist stable with episodic symptoms of angina.  Managed conservatively.   Acute issues   Neck and left shoulder pain Her shoulder was injected and she did well initially but think the pain is coming back She is taking tylenol as needed Chest discomfort Not having anymore Think it was related to stress due to her son Depression feels like she is doing better  Past Medical History:  Diagnosis Date   Acute lumbar radiculopathy 04/2012   Anemia    history of   Anxiety    Atopic eczema    CAD (coronary artery disease)    Complication of anesthesia    Depression    Dyspnea    with activity   Essential hypertension, benign    Hyperlipidemia, statin intolerant    Hypothyroidism    Myocardial infarction [facility]) 2009   NSTEMI (non-ST elevated myocardial infarction) (HCC) 2009   Osteoporosis, s/p bisphophonates x 7 years, stopped in 2011    Pneumonia    PONV (postoperative nausea and vomiting)     Sacrum and coccyx fracture [facility])     Past Surgical History:  Procedure Laterality Date   APPENDECTOMY     CARDIAC CATHETERIZATION  2009   CARPAL TUNNEL RELEASE Bilateral    cateract syrgery Bilateral 10/2018 and 11/2018   HIP SURGERY  1980s   due to infection    LAPAROSCOPIC HYSTERECTOMY     LEFT HEART CATHETERIZATION WITH CORONARY ANGIOGRAM N/A 11/18/2013   Procedure: LEFT HEART CATHETERIZATION WITH CORONARY ANGIOGRAM;  Surgeon: Micheline Chapman, MD;  Location: [facility]ATH LAB;  Service: Cardiovascular;  Laterality: N/A;   Left interior parathyroiectomy     Dr. Gerrit [facility] 08-14-18   LUMBAR LAMINECTOMY/DECOMPRESSION MICRODISCECTOMY  04/17/2012   Procedure: LUMBAR LAMINECTOMY/DECOMPRESSION MICRODISCECTOMY 1 LEVEL;  Surgeon: Maeola Harman, MD;  Location: [facility] NEURO ORS;  Service: Neurosurgery;  Laterality: Left;  Left Lumbar five-Sacral one Microdiskectomy   PARATHYROIDECTOMY Left 08/14/2018   Procedure: LEFT INFERIOR PARATHYROIDECTOMY;  Surgeon: Darnell Level, MD;  Location: [facility] ORS;  Service: General;  Laterality: Left;   SPINE SURGERY  03/2018   Dr Venetia Maxon   TONSILLECTOMY     TOOTH EXTRACTION     tooth implant      Allergies  Allergen Reactions   Shellfish Allergy Hives and Shortness Of Breath   Codeine Other (See Comments)    Headache   Imdur [Isosorbide Nitrate] Other (See  Comments)    Confusion   Lanolin Itching   Latex Other (See Comments)    Unknown, allergy testing result   Pollen Extract    Statins     Simvastatin, Lipitor (both caused muscle aches); Crestor 5 mg and 10 mg daily (memory impairment), Livalo (throat and mouth itching), pravastatin 10 mg qd (memory)   Tramadol     Patient said she had Mental break down    Cephalexin Itching and Rash   Ciprofloxacin Rash   Clindamycin/Lincomycin Rash   Erythromycin Itching and Rash   Penicillins Itching and Rash   Propylene Glycol Itching and Rash   Sulfa Antibiotics Itching and Rash   Tetracycline Itching and Rash     Outpatient Encounter Medications as of 02/01/2023  Medication Sig   amLODipine (NORVASC) 5 MG tablet Take 1 tablet (5 mg total) by mouth daily. Please keep scheduled appointment for future refills. Thank you.   aspirin 81 MG tablet Take 81 mg by mouth daily.     atorvastatin (LIPITOR) 10 MG tablet TAKE 1 TABLET THREE TIMES  WEEKLY. Please keep scheduled appointment for future refills. Thank you.   Cholecalciferol (VITAMIN D) 2000 UNITS CAPS Take 1 capsule (2,000 Units total) by mouth daily.   clobetasol (TEMOVATE) 0.05 % external solution Apply 1 Application topically daily as needed. to scalp.   Coenzyme Q10 (CO Q-10) 300 MG CAPS Take 300 mg by mouth daily.    cyanocobalamin 100 MCG tablet Take 100 mcg by mouth daily.   desonide (DESOWEN) 0.05 % ointment Apply 1 application. topically 2 (two) times daily as needed.   Desoximetasone (TOPICORT) 0.25 % ointment Apply 1 application topically 2 (two) times daily as needed (itching). Apply to hands.    EPINEPHrine 0.3 mg/0.3 mL IJ SOAJ injection Inject 0.3 mg into the muscle Once PRN.   ezetimibe (ZETIA) 10 MG tablet TAKE 1 TABLET DAILY   loratadine (CLARITIN) 10 MG tablet Take 10 mg by mouth daily as needed for allergies.   Multiple Vitamins-Minerals (EYE VITAMINS) TABS Take 1 tablet by mouth 2 (two) times daily.    nitroGLYCERIN (NITROSTAT) 0.4 MG SL tablet PLACE 1 TABLET UNDER THE TONGUE AND ALLOW TO DISSOLVE EVERY 5 MINUTES AS NEEDED FOR CHEST PAIN AS DIRECTED BY YOUR DOCTOR.   SYNTHROID 75 MCG tablet Take 1 tablet (75 mcg total) by mouth daily.   No facility-administered encounter medications on file as of 02/01/2023.    Review of Systems:  Review of Systems  Constitutional:  Negative for activity change and appetite change.  HENT: Negative.    Respiratory:  Negative for cough and shortness of breath.   Cardiovascular:  Negative for leg swelling.  Gastrointestinal:  Negative for constipation.  Genitourinary: Negative.    Musculoskeletal:  Positive for arthralgias. Negative for gait problem and myalgias.  Skin: Negative.   Neurological:  Negative for dizziness and weakness.  Psychiatric/Behavioral:  Negative for confusion, dysphoric mood and sleep disturbance.     Health Maintenance  Topic Date Due   Medicare Annual Wellness (AWV)  05/17/2017   DTaP/Tdap/Td (3 - Td or Tdap) 01/08/2023   MAMMOGRAM  02/12/2023 (Originally 01/02/2021)   COVID-19 Vaccine (7 - 2023-24 season) 02/17/2023 (Originally 10/04/2022)   DEXA SCAN  09/11/2023   Pneumonia Vaccine 63+ Years old  Completed   Zoster Vaccines- Shingrix  Completed   HPV VACCINES  Aged Out   INFLUENZA VACCINE  Discontinued    Physical Exam: Vitals:   02/01/23 1019  BP: 130/80  Pulse: 98  Resp: 17  Temp: 97.7 F (36.5 C)  TempSrc: Temporal  SpO2: 96%  Weight: 148 lb 9.6 oz (67.4 kg)  Height: 5' 3.5" (1.613 m)   Body mass index is 25.91 kg/m. Physical Exam Vitals reviewed.  Constitutional:      Appearance: Normal appearance.  HENT:     Head: Normocephalic.     Nose: Nose normal.     Mouth/Throat:     Mouth: Mucous membranes are moist.     Pharynx: Oropharynx is clear.  Eyes:     Pupils: Pupils are equal, round, and reactive to light.  Cardiovascular:     Rate and Rhythm: Normal rate and regular rhythm.     Pulses: Normal pulses.     Heart sounds: Normal heart sounds. No murmur heard. Pulmonary:     Effort: Pulmonary effort is normal.     Breath sounds: Normal breath sounds.  Abdominal:     General: Abdomen is flat. Bowel sounds are normal.     Palpations: Abdomen is soft.  Musculoskeletal:        General: No swelling.     Cervical back: Neck supple.  Skin:    General: Skin is warm.  Neurological:     General: No focal deficit present.     Mental Status: She is alert and oriented to person, place, and time.  Psychiatric:        Mood and Affect: Mood normal.        Thought Content: Thought content normal.     Labs  reviewed: Basic Metabolic Panel: Recent Labs    02/11/22 0857 07/09/22 0204 07/09/22 0211 10/06/22 0830  NA 140 141 140 140  K 3.9 4.1 4.1 4.2  CL 104 106 103 105  CO2 29 26  --  27  GLUCOSE 61* 113* 109* 67  BUN 18 17 23 17   CREATININE 0.90 0.90 0.80 0.76  CALCIUM 9.6 9.3  --  9.2  TSH 1.56  --   --  1.52   Liver Function Tests: Recent Labs    02/11/22 0857 07/09/22 0204 10/06/22 0830  AST 16 18 16   ALT 16 16 16   ALKPHOS 48 54  --   BILITOT 0.5 0.3 0.6  PROT 7.4 6.7 7.4  ALBUMIN 4.2 3.5  --    Recent Labs    07/09/22 0204  LIPASE 33   No results for input(s): "AMMONIA" in the last 8760 hours. CBC: Recent Labs    07/09/22 0204 07/09/22 0211 10/06/22 0830  WBC 7.3  --  6.2  NEUTROABS 3.5  --  2,945  HGB 12.6 12.6 13.4  HCT 38.0 37.0 40.5  MCV 95.0  --  92.9  PLT 237  --  280   Lipid Panel: Recent Labs    10/06/22 0830  CHOL 155  HDL 52  LDLCALC 84  TRIG 99  CHOLHDL 3.0   Lab Results  Component Value Date   HGBA1C 5.5 05/17/2016    Procedures since last visit: No results found.  Assessment/Plan 1. Chronic left shoulder pain Doing well Working with Dr August Saucer Uses Tylenol as needed  2. Age-related osteoporosis without fracture Will Follow with Dr Lafe Garin for Prolia  3. Acquired hypothyroidism Normal in 01/24  4. Neck pain Mostly doing well  5. Reactive depression Wants to try Lifesource therapy Info given 6. Coronary artery disease involving native coronary artery of native heart, unspecified whether angina present Sees Dr Copper   7. Mixed hyperlipidemia Lipitor and Zetia 8 Hypertension On  Norvasc     Labs/tests ordered:  * No order type specified * Next appt:  Visit date not found

## 2023-02-07 ENCOUNTER — Other Ambulatory Visit: Payer: Self-pay | Admitting: Cardiovascular Disease

## 2023-02-07 DIAGNOSIS — R0609 Other forms of dyspnea: Secondary | ICD-10-CM

## 2023-02-07 DIAGNOSIS — I10 Essential (primary) hypertension: Secondary | ICD-10-CM

## 2023-02-07 DIAGNOSIS — I25119 Atherosclerotic heart disease of native coronary artery with unspecified angina pectoris: Secondary | ICD-10-CM

## 2023-02-07 DIAGNOSIS — I25118 Atherosclerotic heart disease of native coronary artery with other forms of angina pectoris: Secondary | ICD-10-CM

## 2023-02-07 DIAGNOSIS — E782 Mixed hyperlipidemia: Secondary | ICD-10-CM

## 2023-02-08 ENCOUNTER — Other Ambulatory Visit: Payer: Self-pay

## 2023-02-08 DIAGNOSIS — I10 Essential (primary) hypertension: Secondary | ICD-10-CM

## 2023-02-08 DIAGNOSIS — I25119 Atherosclerotic heart disease of native coronary artery with unspecified angina pectoris: Secondary | ICD-10-CM

## 2023-02-08 DIAGNOSIS — R0609 Other forms of dyspnea: Secondary | ICD-10-CM

## 2023-02-08 DIAGNOSIS — E782 Mixed hyperlipidemia: Secondary | ICD-10-CM

## 2023-02-08 DIAGNOSIS — I25118 Atherosclerotic heart disease of native coronary artery with other forms of angina pectoris: Secondary | ICD-10-CM

## 2023-02-08 MED ORDER — ATORVASTATIN CALCIUM 10 MG PO TABS
ORAL_TABLET | ORAL | 3 refills | Status: DC
Start: 2023-02-08 — End: 2023-10-11

## 2023-02-15 ENCOUNTER — Encounter: Payer: Medicare Other | Admitting: Internal Medicine

## 2023-03-21 ENCOUNTER — Ambulatory Visit: Payer: Medicare Other | Admitting: Internal Medicine

## 2023-05-04 ENCOUNTER — Other Ambulatory Visit: Payer: Self-pay | Admitting: Cardiovascular Disease

## 2023-05-04 DIAGNOSIS — R0609 Other forms of dyspnea: Secondary | ICD-10-CM

## 2023-05-04 DIAGNOSIS — I10 Essential (primary) hypertension: Secondary | ICD-10-CM

## 2023-05-04 DIAGNOSIS — I25118 Atherosclerotic heart disease of native coronary artery with other forms of angina pectoris: Secondary | ICD-10-CM

## 2023-05-04 DIAGNOSIS — I25119 Atherosclerotic heart disease of native coronary artery with unspecified angina pectoris: Secondary | ICD-10-CM

## 2023-05-04 DIAGNOSIS — E782 Mixed hyperlipidemia: Secondary | ICD-10-CM

## 2023-10-05 ENCOUNTER — Other Ambulatory Visit: Payer: Medicare Other

## 2023-10-06 LAB — LIPID PANEL
Cholesterol: 146 mg/dL (ref <200)
HDL: 55 mg/dL (ref >=50)
LDL Cholesterol (Calc): 76 mg/dL
Non-HDL Cholesterol (Calc): 91 mg/dL (ref <130)
Total CHOL/HDL Ratio: 2.7 (calc) (ref <5.0)
Triglycerides: 69 mg/dL (ref <150)

## 2023-10-06 LAB — CBC WITH DIFFERENTIAL/PLATELET
Absolute Lymphocytes: 2555 {cells}/uL (ref 850–3900)
Absolute Monocytes: 410 {cells}/uL (ref 200–950)
Basophils Absolute: 33 {cells}/uL (ref 0–200)
Basophils Relative: 0.5 %
Eosinophils Absolute: 423 {cells}/uL (ref 15–500)
Eosinophils Relative: 6.5 %
HCT: 41.8 % (ref 35.0–45.0)
Hemoglobin: 13.8 g/dL (ref 11.7–15.5)
MCH: 31.1 pg (ref 27.0–33.0)
MCHC: 33 g/dL (ref 32.0–36.0)
MCV: 94.1 fL (ref 80.0–100.0)
MPV: 10.5 fL (ref 7.5–12.5)
Monocytes Relative: 6.3 %
Neutro Abs: 3081 {cells}/uL (ref 1500–7800)
Neutrophils Relative %: 47.4 %
Platelets: 265 10*3/uL (ref 140–400)
RBC: 4.44 10*6/uL (ref 3.80–5.10)
RDW: 12.5 % (ref 11.0–15.0)
Total Lymphocyte: 39.3 %
WBC: 6.5 10*3/uL (ref 3.8–10.8)

## 2023-10-06 LAB — TSH: TSH: 0.58 m[IU]/L (ref 0.40–4.50)

## 2023-10-06 LAB — COMPLETE METABOLIC PANEL WITH GFR
AG Ratio: 1.4 (calc) (ref 1.0–2.5)
ALT: 14 U/L (ref 6–29)
AST: 15 U/L (ref 10–35)
Albumin: 4.3 g/dL (ref 3.6–5.1)
Alkaline phosphatase (APISO): 72 U/L (ref 37–153)
BUN: 17 mg/dL (ref 7–25)
CO2: 27 mmol/L (ref 20–32)
Calcium: 9.3 mg/dL (ref 8.6–10.4)
Chloride: 104 mmol/L (ref 98–110)
Creat: 0.81 mg/dL (ref 0.60–0.95)
Globulin: 3.1 g/dL (ref 1.9–3.7)
Glucose, Bld: 76 mg/dL (ref 65–99)
Potassium: 4.3 mmol/L (ref 3.5–5.3)
Sodium: 137 mmol/L (ref 135–146)
Total Bilirubin: 0.6 mg/dL (ref 0.2–1.2)
Total Protein: 7.4 g/dL (ref 6.1–8.1)
eGFR: 72 mL/min/{1.73_m2} (ref >=60)

## 2023-10-11 ENCOUNTER — Encounter: Payer: Self-pay | Admitting: Internal Medicine

## 2023-10-11 ENCOUNTER — Non-Acute Institutional Stay: Payer: Medicare Other | Admitting: Internal Medicine

## 2023-10-11 VITALS — BP 126/78 | HR 65 | Temp 97.8°F | Resp 18 | Ht 63.5 in | Wt 150.5 lb

## 2023-10-11 DIAGNOSIS — E782 Mixed hyperlipidemia: Secondary | ICD-10-CM

## 2023-10-11 DIAGNOSIS — I10 Essential (primary) hypertension: Secondary | ICD-10-CM

## 2023-10-11 DIAGNOSIS — Z7189 Other specified counseling: Secondary | ICD-10-CM | POA: Diagnosis not present

## 2023-10-11 DIAGNOSIS — I25119 Atherosclerotic heart disease of native coronary artery with unspecified angina pectoris: Secondary | ICD-10-CM

## 2023-10-11 DIAGNOSIS — E039 Hypothyroidism, unspecified: Secondary | ICD-10-CM

## 2023-10-11 MED ORDER — EZETIMIBE 10 MG PO TABS: 10.0000 mg | ORAL_TABLET | Freq: Every day | ORAL | 3 refills | Status: DC

## 2023-10-11 MED ORDER — NITROGLYCERIN 0.4 MG SL SUBL: SUBLINGUAL_TABLET | SUBLINGUAL | 3 refills | Status: DC

## 2023-10-11 MED ORDER — AMLODIPINE BESYLATE 5 MG PO TABS: 5.0000 mg | ORAL_TABLET | Freq: Every day | ORAL | 3 refills | Status: DC

## 2023-10-11 MED ORDER — ATORVASTATIN CALCIUM 10 MG PO TABS
ORAL_TABLET | ORAL | 3 refills | Status: DC
Start: 2023-10-11 — End: 2024-07-09

## 2023-10-11 MED ORDER — SYNTHROID 75 MCG PO TABS: 75.0000 ug | ORAL_TABLET | Freq: Every day | ORAL | 3 refills | Status: DC

## 2023-10-11 MED ORDER — EPINEPHRINE 0.3 MG/0.3ML IJ SOAJ: 0.3000 mg | Freq: Once | INTRAMUSCULAR | 0 refills | Status: AC | PRN

## 2023-10-11 NOTE — Progress Notes (Signed)
 Location:  [facility] Service:  Clinic (12)  Provider:   Code Status: DNR Goals of Care:     02/01/2023   10:24 AM  Advanced Directives  Does Patient Have a Medical Advance Directive? Yes  Type of Estate Agent of [place];Living will  Does patient want to make changes to medical advance directive? No - Patient declined  Copy of Healthcare Power of Attorney in Chart? No - copy requested     Chief Complaint  Patient presents with   Medical Management of Chronic Issues     follow up with labs   Health Maintenance    Medicare Annual Wellness Visit, Mammogram and Dexa Scan    HPI: Patient is a 83 y.o. female seen today for medical management of chronic diseases.   Discussed the use of AI scribe software for clinical note transcription with the patient, who gave verbal consent to proceed.  She reports a significant improvement in overall health over the past six months, describing it as if she's gotten a new body. She denies any chest pain or discomfort and reports no pain in her previously problematic left shoulder.  The patient was diagnosed with macular degeneration in September and has been receiving monthly treatments, which she hopes will eventually be reduced to quarterly. She reports some improvement, but still experiences difficulty with vision, describing it as looking through a screen rather than a clear view. She has not yet sought an adjustment to her glasses, but has found a magnifying light helpful for reading. She has not been driving for the past year due to decreased confidence and worsening traffic, but does not rule out the possibility in an emergency.  The patient has decided against Prolia shots for osteoporosis due to concerns about side effects and is no longer seeing the prescribing doctor.  She has received her flu, COVID, and RS shots, as well as a tetanus DT shot.   The patient's mood has improved significantly,  with settled family issues contributing to a feeling of being on a high.  She has a prescription for nitroglycerin  sublingual for chest pain, which she used a couple of times last year, but has not needed recently.  She also has an EpiPen  for severe shellfish allergies.  The patient has expressed a desire for a Do Not Resuscitate (DNR) order and has begun discussions about the extent of treatment she would want in various scenarios. She has a healthcare power of attorney and a living will in place. She has expressed a preference against any artificial means of life support, but is open to treatments for conditions from which she could recover.       Past Medical History:  Diagnosis Date   Acute lumbar radiculopathy 04/2012   Anemia    history of   Anxiety    Atopic eczema    CAD (coronary artery disease)    Complication of anesthesia    Depression    Dyspnea    with activity   Essential hypertension, benign    Hyperlipidemia, statin intolerant    Hypothyroidism    Myocardial infarction [facility]) 2009   NSTEMI (non-ST elevated myocardial infarction) (HCC) 2009   Osteoporosis, s/p bisphophonates x 7 years, stopped in 2011    Pneumonia    PONV (postoperative nausea and vomiting)    Sacrum and coccyx fracture [facility])     Past Surgical History:  Procedure Laterality Date   APPENDECTOMY     CARDIAC  CATHETERIZATION  2009   CARPAL TUNNEL RELEASE Bilateral    cateract syrgery Bilateral 10/2018 and 11/2018   HIP SURGERY  1980s   due to infection    LAPAROSCOPIC HYSTERECTOMY     LEFT HEART CATHETERIZATION WITH CORONARY ANGIOGRAM N/A 11/18/2013   Procedure: LEFT HEART CATHETERIZATION WITH CORONARY ANGIOGRAM;  Surgeon: Ozell JONETTA Fell, MD;  Location: [facility] CATH LAB;  Service: Cardiovascular;  Laterality: N/A;   Left interior parathyroiectomy     Dr. Eletha 08-14-18   LUMBAR LAMINECTOMY/DECOMPRESSION MICRODISCECTOMY  04/17/2012   Procedure: LUMBAR LAMINECTOMY/DECOMPRESSION MICRODISCECTOMY 1  LEVEL;  Surgeon: Fairy Levels, MD;  Location: [facility] NEURO ORS;  Service: Neurosurgery;  Laterality: Left;  Left Lumbar five-Sacral one Microdiskectomy   PARATHYROIDECTOMY Left 08/14/2018   Procedure: LEFT INFERIOR PARATHYROIDECTOMY;  Surgeon: Eletha Boas, MD;  Location: [facility] ORS;  Service: General;  Laterality: Left;   SPINE SURGERY  03/2018   Dr Levels   TONSILLECTOMY     TOOTH EXTRACTION     tooth implant      Allergies  Allergen Reactions   Shellfish Allergy Hives and Shortness Of Breath   Codeine Other (See Comments)    Headache   Imdur  [Isosorbide  Nitrate] Other (See Comments)    Confusion   Lanolin Itching   Latex Other (See Comments)    Unknown, allergy testing result   Pollen Extract    Statins     Simvastatin, Lipitor (both caused muscle aches); Crestor 5 mg and 10 mg daily (memory impairment), Livalo  (throat and mouth itching), pravastatin  10 mg qd (memory)   Tramadol     Patient said she had Mental break down    Cephalexin Itching and Rash   Ciprofloxacin Rash   Clindamycin /Lincomycin Rash   Erythromycin Itching and Rash   Penicillins Itching and Rash   Propylene Glycol Itching and Rash   Sulfa Antibiotics Itching and Rash   Tetracycline Itching and Rash    Outpatient Encounter Medications as of 10/11/2023  Medication Sig   amLODipine  (NORVASC ) 5 MG tablet TAKE 1 TABLET DAILY   aspirin  81 MG tablet Take 81 mg by mouth daily.     atorvastatin  (LIPITOR) 10 MG tablet TAKE 1 TABLET THREE TIMES  WEEKLY.   Cholecalciferol  (VITAMIN D ) 2000 UNITS CAPS Take 1 capsule (2,000 Units total) by mouth daily.   clobetasol  (TEMOVATE ) 0.05 % external solution Apply 1 Application topically daily as needed. to scalp.   Coenzyme Q10 (CO Q-10) 300 MG CAPS Take 300 mg by mouth daily.    cyanocobalamin 100 MCG tablet Take 100 mcg by mouth daily.   desonide (DESOWEN) 0.05 % ointment Apply 1 application. topically 2 (two) times daily as needed.   Desoximetasone  (TOPICORT ) 0.25 % ointment Apply  1 application topically 2 (two) times daily as needed (itching). Apply to hands.    EPINEPHrine  0.3 mg/0.3 mL IJ SOAJ injection Inject 0.3 mg into the muscle Once PRN.   ezetimibe  (ZETIA ) 10 MG tablet TAKE 1 TABLET DAILY   loratadine (CLARITIN) 10 MG tablet Take 10 mg by mouth daily as needed for allergies.   Multiple Vitamins-Minerals (EYE VITAMINS) TABS Take 1 tablet by mouth 2 (two) times daily.    nitroGLYCERIN  (NITROSTAT ) 0.4 MG SL tablet PLACE 1 TABLET UNDER THE TONGUE AND ALLOW TO DISSOLVE EVERY 5 MINUTES AS NEEDED FOR CHEST PAIN AS DIRECTED BY YOUR DOCTOR.   SYNTHROID  75 MCG tablet Take 1 tablet (75 mcg total) by mouth daily.   No facility-administered encounter medications on file as of 10/11/2023.  Review of Systems:  Review of Systems  Constitutional:  Negative for activity change and appetite change.  HENT: Negative.    Respiratory:  Negative for cough and shortness of breath.   Cardiovascular:  Negative for leg swelling.  Gastrointestinal:  Negative for constipation.  Genitourinary: Negative.   Musculoskeletal:  Negative for arthralgias, gait problem and myalgias.  Skin: Negative.   Neurological:  Negative for dizziness and weakness.  Psychiatric/Behavioral:  Negative for confusion, dysphoric mood and sleep disturbance.     Health Maintenance  Topic Date Due   Medicare Annual Wellness (AWV)  05/17/2017   MAMMOGRAM  01/02/2021   DEXA SCAN  09/11/2023   DTaP/Tdap/Td (4 - Td or Tdap) 07/01/2033   Pneumonia Vaccine 6+ Years old  Completed   COVID-19 Vaccine  Completed   Zoster Vaccines- Shingrix  Completed   HPV VACCINES  Aged Out   INFLUENZA VACCINE  Discontinued    Physical Exam: Vitals:   10/11/23 1003  BP: 126/78  Pulse: 65  Resp: 18  Temp: 97.8 F (36.6 C)  SpO2: 92%  Weight: 150 lb 8 oz (68.3 kg)  Height: 5' 3.5 (1.613 m)   Body mass index is 26.24 kg/m. Physical Exam Vitals reviewed.  Constitutional:      Appearance: Normal appearance.  HENT:      Head: Normocephalic.     Nose: Nose normal.     Mouth/Throat:     Mouth: Mucous membranes are moist.     Pharynx: Oropharynx is clear.  Eyes:     Pupils: Pupils are equal, round, and reactive to light.  Cardiovascular:     Rate and Rhythm: Normal rate and regular rhythm.     Pulses: Normal pulses.     Heart sounds: Normal heart sounds. No murmur heard. Pulmonary:     Effort: Pulmonary effort is normal.     Breath sounds: Normal breath sounds.  Abdominal:     General: Abdomen is flat. Bowel sounds are normal.     Palpations: Abdomen is soft.  Musculoskeletal:        General: No swelling.     Cervical back: Neck supple.  Skin:    General: Skin is warm.  Neurological:     General: No focal deficit present.     Mental Status: She is alert and oriented to person, place, and time.  Psychiatric:        Mood and Affect: Mood normal.        Thought Content: Thought content normal.     Labs reviewed: Basic Metabolic Panel: Recent Labs    10/05/23 0827  NA 137  K 4.3  CL 104  CO2 27  GLUCOSE 76  BUN 17  CREATININE 0.81  CALCIUM  9.3  TSH 0.58   Liver Function Tests: Recent Labs    10/05/23 0827  AST 15  ALT 14  BILITOT 0.6  PROT 7.4   No results for input(s): LIPASE, AMYLASE in the last 8760 hours. No results for input(s): AMMONIA in the last 8760 hours. CBC: Recent Labs    10/05/23 0827  WBC 6.5  NEUTROABS 3,081  HGB 13.8  HCT 41.8  MCV 94.1  PLT 265   Lipid Panel: Recent Labs    10/05/23 0827  CHOL 146  HDL 55  LDLCALC 76  TRIG 69  CHOLHDL 2.7   Lab Results  Component Value Date   HGBA1C 5.5 05/17/2016    Procedures since last visit: No results found.  Assessment/Plan Assessment and Plan  Hypertension Norvasc    Hypothyroidism Stable on current dose of Synthroid . -Continue Synthroid  at current dose.  Osteoporosis Patient declined Prolia due to concerns about side effects. -No change in management at this  time.  Depression and Anxiety Improved mood reported. -Continue current management.  CAD Patient has nitroglycerin  sublingual for occasional use, used twice in the past year with good effect. -Renew nitroglycerin  prescription. Follows with Dr Wonda [place] Allergy Severe, patient has EpiPen  for emergency use. -Renew EpiPen  prescription. HLD On Lipitor and Zetia   General Health Maintenance -Continue regular follow-up with cardiologist (Dr. Wonda) and audiologist. -Received flu shot, COVID shot, and RS shot. -Received tetanus DPT shot. -Discussed DNR and healthcare power of attorney, patient to discuss with family and inform office of decision.         Labs/tests ordered:  * No order type specified * Next appt:  Visit date not found

## 2023-11-16 ENCOUNTER — Telehealth: Payer: Self-pay

## 2023-11-16 NOTE — Telephone Encounter (Signed)
Patient states she is having som frequent urination and some discomfort and wants something for a UTI that's starting. She states it has not been that long since she seen you so she does not want an appointment

## 2023-11-17 ENCOUNTER — Other Ambulatory Visit: Payer: Self-pay | Admitting: Internal Medicine

## 2023-11-17 DIAGNOSIS — N3000 Acute cystitis without hematuria: Secondary | ICD-10-CM

## 2023-11-17 MED ORDER — NITROFURANTOIN MONOHYD MACRO 100 MG PO CAPS
100.0000 mg | ORAL_CAPSULE | Freq: Two times a day (BID) | ORAL | 0 refills | Status: DC
Start: 2023-11-17 — End: 2024-07-09

## 2023-11-17 NOTE — Telephone Encounter (Signed)
Patient states she will call when she is available to bring over a specimen

## 2023-11-17 NOTE — Telephone Encounter (Signed)
Let her know if she can just come and give urine sample in the office that would be helpful as she is allergic to lot of antibiotics. I am ordering Macrodantin. She can give the sample and then start the Antibiotics if possible

## 2023-11-22 ENCOUNTER — Other Ambulatory Visit: Payer: Self-pay

## 2023-11-22 DIAGNOSIS — R350 Frequency of micturition: Secondary | ICD-10-CM

## 2023-11-22 DIAGNOSIS — R300 Dysuria: Secondary | ICD-10-CM

## 2023-11-24 LAB — URINALYSIS
Bilirubin Urine: NEGATIVE
Glucose, UA: NEGATIVE
Hgb urine dipstick: NEGATIVE
Ketones, ur: NEGATIVE
Leukocytes,Ua: NEGATIVE
Nitrite: NEGATIVE
Protein, ur: NEGATIVE
Specific Gravity, Urine: 1.01 (ref 1.001–1.035)
pH: 6.5 (ref 5.0–8.0)

## 2023-11-24 LAB — URINE CULTURE
MICRO NUMBER:: 16108389
Result:: NO GROWTH
SPECIMEN QUALITY:: ADEQUATE

## 2023-11-24 LAB — EXTRA URINE SPECIMEN

## 2024-07-09 ENCOUNTER — Ambulatory Visit: Attending: Cardiovascular Disease | Admitting: Cardiovascular Disease

## 2024-07-09 ENCOUNTER — Encounter: Payer: Self-pay | Admitting: Cardiovascular Disease

## 2024-07-09 VITALS — BP 140/80 | HR 71 | Ht 63.5 in | Wt 147.4 lb

## 2024-07-09 DIAGNOSIS — I10 Essential (primary) hypertension: Secondary | ICD-10-CM

## 2024-07-09 DIAGNOSIS — I25119 Atherosclerotic heart disease of native coronary artery with unspecified angina pectoris: Secondary | ICD-10-CM | POA: Diagnosis not present

## 2024-07-09 DIAGNOSIS — E782 Mixed hyperlipidemia: Secondary | ICD-10-CM | POA: Diagnosis not present

## 2024-07-09 MED ORDER — ATORVASTATIN CALCIUM 10 MG PO TABS: ORAL_TABLET | ORAL | 3 refills | Status: AC

## 2024-07-09 MED ORDER — NITROGLYCERIN 0.4 MG SL SUBL: SUBLINGUAL_TABLET | SUBLINGUAL | 3 refills | Status: AC

## 2024-07-09 MED ORDER — AMLODIPINE BESYLATE 5 MG PO TABS: 5.0000 mg | ORAL_TABLET | Freq: Every day | ORAL | 3 refills | Status: AC

## 2024-07-09 MED ORDER — EZETIMIBE 10 MG PO TABS: 10.0000 mg | ORAL_TABLET | Freq: Every day | ORAL | 3 refills | Status: AC

## 2024-07-09 NOTE — Assessment & Plan Note (Signed)
 Treated with a combination of atorvastatin  and ezetimibe .  Lipids stable with last panel showing cholesterol 146, HDL 55, LDL 76, triglycerides 69.

## 2024-07-09 NOTE — Assessment & Plan Note (Signed)
 Patient stable with rare chest discomfort.  She is very content with how she is doing, much less angina than in the past.  Continue current management which includes aspirin , statin drug, and amlodipine .  Patient uses nitroglycerin  as needed on rare occasion.

## 2024-07-09 NOTE — Patient Instructions (Signed)

## 2024-07-09 NOTE — Progress Notes (Signed)
 Cardiology Office Note:    Date:  07/09/2024   ID:  Victoria [name], DOB [birth year], MRN [id number]  PCP:  Charlanne Fredia LITTIE, MD   [facility] Providers Cardiologist:  None     Referring MD: Charlanne Fredia LITTIE, MD   Chief Complaint  Patient presents with   Coronary Artery Disease    History of Present Illness:    Victoria [name] is a 83 y.o. female with a hx of  coronary artery disease who initially presented in 2009 with non-ST elevation MI treated with balloon angioplasty of a small circumflex branch.  In 2016 she developed progressive angina and underwent repeat catheterization with demonstration of moderate diffuse mid LAD stenosis and borderline FFR analysis.  Medical therapy was recommended.  She had a nuclear scan in 2019 demonstrating no ischemia and normal LVEF of 75%.  Echo in 2023 showed LVEF of 55 to 60% with mild asymmetric LVH, normal RV function, and small pericardial effusion.  The patient is here with her husband today.  She been doing well from a cardiac perspective.  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  Otherwise she reports no problems.  States that her chest pain symptoms have been less noticeable than in the past because she is busier.  She occasionally wakes up at night with chest discomfort but attributes this to stress.  She has no exertional symptoms.  She denies shortness of breath, heart palpitations, or leg swelling.  She reports no recent changes in her medications.  Current Medications: Current Meds  Medication Sig   aspirin  81 MG tablet Take 81 mg by mouth daily.     Cholecalciferol  (VITAMIN D ) 2000 UNITS CAPS Take 1 capsule (2,000 Units total) by mouth daily.   clobetasol  (TEMOVATE ) 0.05 % external solution Apply 1 Application topically daily as needed. to scalp.   Coenzyme Q10 (CO Q-10) 300 MG CAPS Take 300 mg by mouth daily.    cyanocobalamin 100 MCG tablet Take 100 mcg by mouth  daily.   desonide (DESOWEN) 0.05 % ointment Apply 1 application. topically 2 (two) times daily as needed.   Desoximetasone  (TOPICORT ) 0.25 % ointment Apply 1 application topically 2 (two) times daily as needed (itching). Apply to hands.    EPINEPHrine  0.3 mg/0.3 mL IJ SOAJ injection Inject 0.3 mg into the muscle Once PRN.   loratadine (CLARITIN) 10 MG tablet Take 10 mg by mouth daily as needed for allergies.   Multiple Vitamins-Minerals (EYE VITAMINS) TABS Take 1 tablet by mouth 2 (two) times daily.    SYNTHROID  75 MCG tablet Take 1 tablet (75 mcg total) by mouth daily.   [DISCONTINUED] amLODipine  (NORVASC ) 5 MG tablet Take 1 tablet (5 mg total) by mouth daily.   [DISCONTINUED] atorvastatin  (LIPITOR) 10 MG tablet TAKE 1 TABLET THREE TIMES  WEEKLY.   [DISCONTINUED] ezetimibe  (ZETIA ) 10 MG tablet Take 1 tablet (10 mg total) by mouth daily.   [DISCONTINUED] nitrofurantoin , macrocrystal-monohydrate, (MACROBID ) 100 MG capsule Take 1 capsule (100 mg total) by mouth 2 (two) times daily.   [DISCONTINUED] nitroGLYCERIN  (NITROSTAT ) 0.4 MG SL tablet PLACE 1 TABLET UNDER THE TONGUE AND ALLOW TO DISSOLVE EVERY 5 MINUTES AS NEEDED FOR CHEST PAIN AS DIRECTED BY YOUR DOCTOR.     Allergies:   Shellfish allergy, Codeine, Imdur  [isosorbide  nitrate], Lanolin, Latex, Pollen extract, Statins, Tramadol, Cephalexin, Ciprofloxacin, Clindamycin /lincomycin, Erythromycin, Penicillins, Propylene glycol, Sulfa antibiotics, and Tetracycline   ROS:   Please see the history  of present illness.    All other systems reviewed and are negative.  EKGs/Labs/Other Studies Reviewed:    The following studies were reviewed today: Cardiac Studies & Procedures   ______________________________________________________________________________________________   STRESS TESTS  MYOCARDIAL PERFUSION IMAGING 02/20/2018  Interpretation Summary  Nuclear stress EF: 75%.  There was no ST segment deviation noted during stress.  The left  ventricular ejection fraction is hyperdynamic (>65%).  Poor quality study with diffusely low counts in apex and inferior wall on all images No evidence of ischemia EF 75%   ECHOCARDIOGRAM  ECHOCARDIOGRAM COMPLETE 11/01/2021  Narrative ECHOCARDIOGRAM REPORT    Patient Name:   Victoria [name] Date of Exam: 11/01/2021 Medical Rec #:  [id number]           Height:       63.5 in Accession #:    [id number]          Weight:       147.2 lb Date of Birth:  08-01-1941            BSA:          1.707 m Patient Age:    80 years            BP:           120/70 mmHg Patient Gender: F                   HR:           67 bpm. Exam Location:  [address]  Procedure: 2D Echo, Cardiac Doppler and Color Doppler  Indications:    R06.9 DOE  History:        Patient has no prior history of Echocardiogram examinations. CAD and Previous Myocardial Infarction; Risk Factors:Hypertension and Dyslipidemia. Hypothyroidism. Anemia.  Sonographer:    Jon Hacker RCS Referring Phys: [phone]   Sonographer Comments: This was a technically difficult study, patient was experiencing left hip discomfort. She repositioned herself for comfort and alternatively, a pillow was placed under her left hip. IMPRESSIONS   1. Left ventricular ejection fraction, by estimation, is 55 to 60%. The left ventricle has normal function. The left ventricle has no regional wall motion abnormalities. There is mild asymmetric left ventricular hypertrophy. Left ventricular diastolic parameters are indeterminate. Elevated left atrial pressure. 2. Right ventricular systolic function is normal. The right ventricular size is normal. Tricuspid regurgitation signal is inadequate for assessing PA pressure. 3. A small pericardial effusion is present. The pericardial effusion is anterior to the right ventricle. 4. The mitral valve is normal in structure. Trivial mitral valve regurgitation. No evidence of mitral stenosis. 5. Mildly  reduced LV stroke volume index. The aortic valve is tricuspid. There is moderate calcification of the aortic valve. There is moderate thickening of the aortic valve. Aortic valve regurgitation is mild. Aortic valve sclerosis/calcification is present. Mean graidnet 2.63 mm Hg, assessent only in the A5c view. 6. The inferior vena cava is normal in size with greater than 50% respiratory variability, suggesting right atrial pressure of 3 mmHg.  Conclusion(s)/Recommendation(s): Discrepancy between degree of aortic calcium  and gradient. Aortic valve calcium  scoring may be useful if clinically indicated.  FINDINGS Left Ventricle: Left ventricular ejection fraction, by estimation, is 55 to 60%. The left ventricle has normal function. The left ventricle has no regional wall motion abnormalities. The left ventricular internal cavity size was small. There is mild asymmetric left ventricular hypertrophy. Left ventricular diastolic parameters are indeterminate. Elevated left atrial pressure.  Right  Ventricle: The right ventricular size is normal. No increase in right ventricular wall thickness. Right ventricular systolic function is normal. Tricuspid regurgitation signal is inadequate for assessing PA pressure.  Left Atrium: Left atrial size was normal in size.  Right Atrium: Right atrial size was not well visualized.  Pericardium: A small pericardial effusion is present. The pericardial effusion is anterior to the right ventricle. Presence of epicardial fat layer.  Mitral Valve: The mitral valve is normal in structure. Trivial mitral valve regurgitation. No evidence of mitral valve stenosis.  Tricuspid Valve: The tricuspid valve is normal in structure. Tricuspid valve regurgitation is not demonstrated. No evidence of tricuspid stenosis.  Aortic Valve: Mildly reduced LV stroke volume index. The aortic valve is tricuspid. There is moderate calcification of the aortic valve. There is moderate thickening of  the aortic valve. Aortic valve regurgitation is mild. Aortic valve sclerosis/calcification is present, without any evidence of aortic stenosis.  Pulmonic Valve: The pulmonic valve was not well visualized. Pulmonic valve regurgitation is not visualized. No evidence of pulmonic stenosis.  Aorta: The aortic root and ascending aorta are structurally normal, with no evidence of dilitation.  Venous: The inferior vena cava is normal in size with greater than 50% respiratory variability, suggesting right atrial pressure of 3 mmHg.  IAS/Shunts: No atrial level shunt detected by color flow Doppler.   LEFT VENTRICLE PLAX 2D LVIDd:         3.40 cm   Diastology LVIDs:         2.40 cm   LV e' medial:    2.75 cm/s LV PW:         0.90 cm   LV E/e' medial:  25.2 LV IVS:        1.20 cm   LV e' lateral:   2.95 cm/s LVOT diam:     2.10 cm   LV E/e' lateral: 23.5 LV SV:         59 LV SV Index:   34 LVOT Area:     3.46 cm   RIGHT VENTRICLE RV S prime:     14.40 cm/s  LEFT ATRIUM             Index LA diam:        3.10 cm 1.82 cm/m LA Vol (A2C):   26.7 ml 15.64 ml/m LA Vol (A4C):   23.4 ml 13.70 ml/m LA Biplane Vol: 26.8 ml 15.70 ml/m AORTIC VALVE LVOT Vmax:   83.00 cm/s LVOT Vmean:  53.100 cm/s LVOT VTI:    0.169 m  AORTA Ao Root diam: 3.10 cm Ao Asc diam:  3.50 cm  MITRAL VALVE MV Area (PHT): 2.47 cm     SHUNTS MV Decel Time: 307 msec     Systemic VTI:  0.17 m MV E velocity: 69.20 cm/s   Systemic Diam: 2.10 cm MV A velocity: 104.00 cm/s MV E/A ratio:  0.67  Stanly Leavens MD Electronically signed by Stanly Leavens MD Signature Date/Time: 11/01/2021/11:15:49 AM    Final          ______________________________________________________________________________________________      EKG:   EKG Interpretation Date/Time:  Tuesday July 09 2024 10:12:20 EDT Ventricular Rate:  71 PR Interval:  164 QRS Duration:  86 QT Interval:  398 QTC Calculation: 432 R  Axis:   -55  Text Interpretation: Normal sinus rhythm Left axis deviation When compared with ECG of 09-Jul-2022 02:15, PREVIOUS ECG IS PRESENT No significant change was found Confirmed by Wonda Sharper [phone]) on 07/09/2024  10:36:07 AM    Recent Labs: 10/05/2023: ALT 14; BUN 17; Creat 0.81; Hemoglobin 13.8; Platelets 265; Potassium 4.3; Sodium 137; TSH 0.58  Recent Lipid Panel    Component Value Date/Time   CHOL 146 10/05/2023 0827   CHOL 165 11/01/2021 0746   TRIG 69 10/05/2023 0827   HDL 55 10/05/2023 0827   HDL 52 11/01/2021 0746   CHOLHDL 2.7 10/05/2023 0827   VLDL 18.8 12/24/2019 0856   LDLCALC 76 10/05/2023 0827           Physical Exam:    VS:  BP (!) 140/80   Pulse 71   Ht 5' 3.5 (1.613 m)   Wt 147 lb 6.4 oz (66.9 kg)   SpO2 95%   BMI 25.70 kg/m     Wt Readings from Last 3 Encounters:  07/09/24 147 lb 6.4 oz (66.9 kg)  10/11/23 150 lb 8 oz (68.3 kg)  02/01/23 148 lb 9.6 oz (67.4 kg)     GEN:  Well nourished, well developed in no acute distress HEENT: Normal NECK: No JVD; No carotid bruits LYMPHATICS: No lymphadenopathy CARDIAC: RRR, no murmurs, rubs, gallops RESPIRATORY:  Clear to auscultation without rales, wheezing or rhonchi  ABDOMEN: Soft, non-tender, non-distended MUSCULOSKELETAL:  No edema; No deformity  SKIN: Warm and dry NEUROLOGIC:  Alert and oriented x 3 PSYCHIATRIC:  Normal affect   Assessment & Plan Coronary artery disease involving native coronary artery of native heart with angina pectoris Patient stable with rare chest discomfort.  She is very content with how she is doing, much less angina than in the past.  Continue current management which includes aspirin , statin drug, and amlodipine .  Patient uses nitroglycerin  as needed on rare occasion. Essential hypertension Blood pressure is controlled.  Continue amlodipine . Mixed hyperlipidemia Treated with a combination of atorvastatin  and ezetimibe .  Lipids stable with last panel showing  cholesterol 146, HDL 55, LDL 76, triglycerides 69.     Medication Adjustments/Labs and Tests Ordered: Current medicines are reviewed at length with the patient today.  Concerns regarding medicines are outlined above.  Orders Placed This Encounter  Procedures   EKG 12-Lead   Meds ordered this encounter  Medications   amLODipine  (NORVASC ) 5 MG tablet    Sig: Take 1 tablet (5 mg total) by mouth daily.    Dispense:  90 tablet    Refill:  3   atorvastatin  (LIPITOR) 10 MG tablet    Sig: TAKE 1 TABLET THREE TIMES  WEEKLY.    Dispense:  45 tablet    Refill:  3   ezetimibe  (ZETIA ) 10 MG tablet    Sig: Take 1 tablet (10 mg total) by mouth daily.    Dispense:  90 tablet    Refill:  3   nitroGLYCERIN  (NITROSTAT ) 0.4 MG SL tablet    Sig: PLACE 1 TABLET UNDER THE TONGUE AND ALLOW TO DISSOLVE EVERY 5 MINUTES AS NEEDED FOR CHEST PAIN AS DIRECTED BY YOUR DOCTOR.    Dispense:  25 tablet    Refill:  3    Patient Instructions  Medication Instructions:  No medication changes were made at this visit. Continue current regimen.   *If you need a refill on your cardiac medications before your next appointment, please call your pharmacy*  Lab Work: None ordered today. If you have labs (blood work) drawn today and your tests are completely normal, you will receive your results only by: MyChart Message (if you have MyChart) OR A paper copy in the mail If you  have any lab test that is abnormal or we need to change your treatment, we will call you to review the results.  Testing/Procedures: None ordered today.  Follow-Up: At [facility], you and your health needs are our priority.  As part of our continuing mission to provide you with exceptional heart care, our providers are all part of one team.  This team includes your primary Cardiologist (physician) and Advanced Practice Providers or APPs (Physician Assistants and Nurse Practitioners) who all work together to provide you with the care  you need, when you need it.  Your next appointment:   1 year(s)  Provider:   Ozell Fell, MD      Signed, Ozell Fell, MD  07/09/2024 1:26 PM    [facility]

## 2024-09-11 ENCOUNTER — Encounter: Payer: Self-pay | Admitting: Internal Medicine

## 2024-09-12 ENCOUNTER — Other Ambulatory Visit: Payer: Self-pay | Admitting: Internal Medicine

## 2024-09-12 MED ORDER — NITROFURANTOIN MACROCRYSTAL 100 MG PO CAPS: 100.0000 mg | ORAL_CAPSULE | Freq: Two times a day (BID) | ORAL | 0 refills | Status: DC

## 2024-09-19 ENCOUNTER — Other Ambulatory Visit: Payer: Medicare Other

## 2024-09-19 LAB — COMPREHENSIVE METABOLIC PANEL WITH GFR
AG Ratio: 1.4 (calc) (ref 1.0–2.5)
ALT: 14 U/L (ref 6–29)
AST: 14 U/L (ref 10–35)
Albumin: 4.2 g/dL (ref 3.6–5.1)
Alkaline phosphatase (APISO): 77 U/L (ref 37–153)
BUN: 13 mg/dL (ref 7–25)
CO2: 27 mmol/L (ref 20–32)
Calcium: 9.3 mg/dL (ref 8.6–10.4)
Chloride: 104 mmol/L (ref 98–110)
Creat: 0.7 mg/dL (ref 0.60–0.95)
Globulin: 3 g/dL (ref 1.9–3.7)
Glucose, Bld: 86 mg/dL (ref 65–99)
Potassium: 4.1 mmol/L (ref 3.5–5.3)
Sodium: 139 mmol/L (ref 135–146)
Total Bilirubin: 0.7 mg/dL (ref 0.2–1.2)
Total Protein: 7.2 g/dL (ref 6.1–8.1)
eGFR: 86 mL/min/1.73m2 (ref >=60)

## 2024-09-19 LAB — CBC WITH DIFFERENTIAL/PLATELET
Absolute Lymphocytes: 3063 {cells}/uL (ref 850–3900)
Absolute Monocytes: 410 {cells}/uL (ref 200–950)
Basophils Absolute: 30 {cells}/uL (ref 0–200)
Basophils Relative: 0.4 %
Eosinophils Absolute: 433 {cells}/uL (ref 15–500)
Eosinophils Relative: 5.7 %
HCT: 41 % (ref 35.9–46.0)
Hemoglobin: 13.3 g/dL (ref 11.7–15.5)
MCH: 30.4 pg (ref 27.0–33.0)
MCHC: 32.4 g/dL (ref 31.6–35.4)
MCV: 93.6 fL (ref 81.4–101.7)
MPV: 10.1 fL (ref 7.5–12.5)
Monocytes Relative: 5.4 %
Neutro Abs: 3663 {cells}/uL (ref 1500–7800)
Neutrophils Relative %: 48.2 %
Platelets: 294 Thousand/uL (ref 140–400)
RBC: 4.38 Million/uL (ref 3.80–5.10)
RDW: 13 % (ref 11.0–15.0)
Total Lymphocyte: 40.3 %
WBC: 7.6 Thousand/uL (ref 3.8–10.8)

## 2024-09-19 LAB — LIPID PANEL
Cholesterol: 138 mg/dL (ref <200)
HDL: 54 mg/dL (ref >=50)
LDL Cholesterol (Calc): 68 mg/dL
Non-HDL Cholesterol (Calc): 84 mg/dL (ref <130)
Total CHOL/HDL Ratio: 2.6 (calc) (ref <5.0)
Triglycerides: 78 mg/dL (ref <150)

## 2024-09-19 LAB — TSH: TSH: 1.71 m[IU]/L (ref 0.40–4.50)

## 2024-09-20 ENCOUNTER — Ambulatory Visit: Payer: Self-pay | Admitting: Internal Medicine

## 2024-10-09 ENCOUNTER — Non-Acute Institutional Stay: Payer: Medicare Other | Admitting: Internal Medicine

## 2024-10-09 ENCOUNTER — Encounter: Payer: Self-pay | Admitting: Internal Medicine

## 2024-10-09 VITALS — BP 128/60 | HR 86 | Temp 97.0°F | Resp 20 | Wt 147.0 lb

## 2024-10-09 DIAGNOSIS — E782 Mixed hyperlipidemia: Secondary | ICD-10-CM | POA: Diagnosis not present

## 2024-10-09 DIAGNOSIS — I25119 Atherosclerotic heart disease of native coronary artery with unspecified angina pectoris: Secondary | ICD-10-CM | POA: Diagnosis not present

## 2024-10-09 DIAGNOSIS — M818 Other osteoporosis without current pathological fracture: Secondary | ICD-10-CM

## 2024-10-09 DIAGNOSIS — E039 Hypothyroidism, unspecified: Secondary | ICD-10-CM

## 2024-10-09 DIAGNOSIS — R195 Other fecal abnormalities: Secondary | ICD-10-CM | POA: Diagnosis not present

## 2024-10-09 DIAGNOSIS — I10 Essential (primary) hypertension: Secondary | ICD-10-CM

## 2024-10-09 NOTE — Patient Instructions (Addendum)
 Labs will be done on April 07, 2025 at [facility] at 7:45 am

## 2024-10-10 ENCOUNTER — Encounter: Payer: Self-pay | Admitting: Internal Medicine

## 2024-10-10 NOTE — Progress Notes (Unsigned)
 "  Location:  [facility] Service:  Clinic (12)  Provider:   Code Status:  Goals of Care:     02/01/2023   10:24 AM  Advanced Directives  Does Patient Have a Medical Advance Directive? Yes  Type of Estate Agent of [place];Living will  Does patient want to make changes to medical advance directive? No - Patient declined  Copy of Healthcare Power of Attorney in Chart? No - copy requested     Chief Complaint  Patient presents with   Medical Management of Chronic Issues    One year follow-up, needs medicare annual wellness visit, bone density scan and covid vaccine. Patient decline mammogram    HPI: Patient is a 84 y.o. female seen today for medical management of chronic diseases.    Lives [place] in [facility] with her husband   Discussed the use of AI scribe software for clinical note transcription with the patient, who gave verbal consent to proceed.  History of Present Illness   Victoria [name] is an 84 year old female who presents with concerns about facial skin irritation and increased mucus in stools.  Facial Rash She has facial skin irritation with redness and itching, without pain. She applies dexamethasone 0.05% cream as needed but is hesitant to use it often because it causes flakiness. She has had this before and usually happens when she has increased anxiety  Mucus in her her BOwels Since last year she has had increased mucus with bowel movements without pain, cramping, change in frequency or consistency, diarrhea, or clear food triggers. She wears a pad for reassurance but has not had stool incontinence. Has not noticed any blood.  Recent UTI.  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 Her right eye vision is markedly impaired and she relies on her left eye.    History of coronary artery disease sees Dr. Wonda She takes amlodipine , Lipitor, vitamin D , B12, and Synthroid . She uses nitroglycerin  infrequently and prefers aspirin  for chest pain that occurs about once a month. Caregiver stress Her husband, who has worsening dementia, is her primary driver. She is concerned about his driving safety and his lack of insight into his condition.        Past Medical History:  Diagnosis Date   Acute lumbar radiculopathy 04/2012   Anemia    history of   Anxiety    Atopic eczema    CAD (coronary artery disease)    Complication of anesthesia    Depression    Dyspnea    with activity   Essential hypertension, benign    Hyperlipidemia, statin intolerant    Hypothyroidism    Myocardial infarction [facility]) 2009   NSTEMI (non-ST elevated myocardial infarction) (HCC) 2009   Osteoporosis, s/p bisphophonates x 7 years, stopped in 2011    Pneumonia    PONV (postoperative nausea and vomiting)    Sacrum and coccyx fracture [facility])     Past Surgical History:  Procedure Laterality Date   APPENDECTOMY     CARDIAC CATHETERIZATION  2009   CARPAL TUNNEL RELEASE Bilateral    cateract syrgery Bilateral 10/2018 and 11/2018   HIP SURGERY  1980s   due to infection    LAPAROSCOPIC HYSTERECTOMY     LEFT HEART CATHETERIZATION WITH CORONARY ANGIOGRAM N/A 11/18/2013   Procedure: LEFT HEART CATHETERIZATION WITH  CORONARY ANGIOGRAM;  Surgeon: Ozell JONETTA Fell, MD;  Location: [facility] CATH LAB;  Service: Cardiovascular;  Laterality: N/A;   Left interior parathyroiectomy     Dr. Eletha 08-14-18   LUMBAR LAMINECTOMY/DECOMPRESSION MICRODISCECTOMY  04/17/2012   Procedure: LUMBAR LAMINECTOMY/DECOMPRESSION MICRODISCECTOMY 1 LEVEL;  Surgeon: Fairy Levels, MD;  Location: [facility] NEURO ORS;  Service: Neurosurgery;  Laterality: Left;  Left Lumbar five-Sacral one Microdiskectomy   PARATHYROIDECTOMY Left 08/14/2018   Procedure: LEFT INFERIOR PARATHYROIDECTOMY;  Surgeon: Eletha Boas, MD;  Location:  [facility] ORS;  Service: General;  Laterality: Left;   SPINE SURGERY  03/2018   Dr Levels   TONSILLECTOMY     TOOTH EXTRACTION     tooth implant      Allergies[1]  Outpatient Encounter Medications as of 10/09/2024  Medication Sig   amLODipine  (NORVASC ) 5 MG tablet Take 1 tablet (5 mg total) by mouth daily.   aspirin  81 MG tablet Take 81 mg by mouth daily.     atorvastatin  (LIPITOR) 10 MG tablet TAKE 1 TABLET THREE TIMES  WEEKLY.   Cholecalciferol  (VITAMIN D ) 2000 UNITS CAPS Take 1 capsule (2,000 Units total) by mouth daily.   clobetasol  (TEMOVATE ) 0.05 % external solution Apply 1 Application topically daily as needed. to scalp.   Coenzyme Q10 (CO Q-10) 300 MG CAPS Take 300 mg by mouth daily.    cyanocobalamin  100 MCG tablet Take 100 mcg by mouth daily.   desonide (DESOWEN) 0.05 % ointment Apply 1 application. topically 2 (two) times daily as needed.   Desoximetasone  (TOPICORT ) 0.25 % ointment Apply 1 application topically 2 (two) times daily as needed (itching). Apply to hands.    EPINEPHrine  0.3 mg/0.3 mL IJ SOAJ injection Inject 0.3 mg into the muscle Once PRN.   ezetimibe  (ZETIA ) 10 MG tablet Take 1 tablet (10 mg total) by mouth daily.   loratadine (CLARITIN) 10 MG tablet Take 10 mg by mouth daily as needed for allergies.   Multiple Vitamins-Minerals (EYE VITAMINS) TABS Take 1 tablet by mouth 2 (two) times daily.    nitroGLYCERIN  (NITROSTAT ) 0.4 MG SL tablet PLACE 1 TABLET UNDER THE TONGUE AND ALLOW TO DISSOLVE EVERY 5 MINUTES AS NEEDED FOR CHEST PAIN AS DIRECTED BY YOUR DOCTOR.   SYNTHROID  75 MCG tablet Take 1 tablet (75 mcg total) by mouth daily.   [DISCONTINUED] nitrofurantoin  (MACRODANTIN ) 100 MG capsule Take 1 capsule (100 mg total) by mouth 2 (two) times daily.   No facility-administered encounter medications on file as of 10/09/2024.    Review of Systems:  Review of Systems  Constitutional:  Negative for activity change and appetite change.  HENT: Negative.    Respiratory:  Negative  for cough and shortness of breath.   Cardiovascular:  Negative for leg swelling.  Gastrointestinal:  Negative for constipation.  Genitourinary: Negative.   Musculoskeletal:  Negative for arthralgias, gait problem and myalgias.  Skin:  Positive for rash.  Neurological:  Negative for dizziness and weakness.  Psychiatric/Behavioral:  Positive for dysphoric mood. Negative for confusion and sleep disturbance. The patient is nervous/anxious.     Health Maintenance  Topic Date Due   Medicare Annual Wellness (AWV)  05/17/2017   Bone Density Scan  09/11/2023   COVID-19 Vaccine (8 - 2025-26 season) 06/03/2024   Mammogram  10/09/2025 (Originally 01/02/2021)   DTaP/Tdap/Td (4 - Td or Tdap) 07/01/2033   Pneumococcal Vaccine: 50+ Years  Completed   Zoster Vaccines- Shingrix  Completed   Meningococcal B Vaccine  Aged Out   Influenza Vaccine  Discontinued  Physical Exam: Vitals:   10/09/24 1240  BP: 128/60  Pulse: 86  Resp: 20  Temp: (!) 97 F (36.1 C)  Weight: 147 lb (66.7 kg)   Body mass index is 25.63 kg/m. Physical Exam Vitals reviewed.  Constitutional:      Appearance: Normal appearance.  HENT:     Head: Normocephalic.     Nose: Nose normal.     Mouth/Throat:     Mouth: Mucous membranes are moist.     Pharynx: Oropharynx is clear.  Eyes:     Pupils: Pupils are equal, round, and reactive to light.  Cardiovascular:     Rate and Rhythm: Normal rate and regular rhythm.     Pulses: Normal pulses.     Heart sounds: Normal heart sounds. No murmur heard. Pulmonary:     Effort: Pulmonary effort is normal.     Breath sounds: Normal breath sounds.  Abdominal:     General: Abdomen is flat. Bowel sounds are normal.     Palpations: Abdomen is soft.  Musculoskeletal:        General: No swelling.     Cervical back: Neck supple.  Skin:    General: Skin is warm.     Comments: Macular Rash on her Face which is not warm or tender  Neurological:     General: No focal deficit present.      Mental Status: She is alert and oriented to person, place, and time.  Psychiatric:        Mood and Affect: Mood normal.        Thought Content: Thought content normal.     Labs reviewed: Basic Metabolic Panel: Recent Labs    09/19/24 0845  NA 139  K 4.1  CL 104  CO2 27  GLUCOSE 86  BUN 13  CREATININE 0.70  CALCIUM  9.3  TSH 1.71   Liver Function Tests: Recent Labs    09/19/24 0845  AST 14  ALT 14  BILITOT 0.7  PROT 7.2   No results for input(s): LIPASE, AMYLASE in the last 8760 hours. No results for input(s): AMMONIA in the last 8760 hours. CBC: Recent Labs    09/19/24 0845  WBC 7.6  NEUTROABS 3,663  HGB 13.3  HCT 41.0  MCV 93.6  PLT 294   Lipid Panel: Recent Labs    09/19/24 0845  CHOL 138  HDL 54  LDLCALC 68  TRIG 78  CHOLHDL 2.6   Lab Results  Component Value Date   HGBA1C 5.5 05/17/2016    Procedures since last visit: No results found.  Assessment/Plan 1. Mucus in stool (Primary) going on for past 1 year With no other red flag symptoms - Ambulatory referral to Gastroenterology  2. Age-related osteoporosis without fracture Was recommended Prolia per Dr. Vianne She wanted to wait Will repeat DEXA - DG Bone Density; Future  3. Essential hypertension On Norvasc   4. Coronary artery disease involving native coronary artery of native heart with angina pectoris Follows with Dr Copper No Change recently Uses Aspirin  and S/L nitroglycerin  when has Chest pain  5. Mixed hyperlipidemia On statin  6. Hypothyroidism, unspecified type TSH normal 7 Facial Rash/Dermtitis Has Steroid Ointment as per Derm Will let us  know if it gets worse  8 Exudative age-related macular degeneration, right eye Exudative age-related macular degeneration in the right eye with vision maintained at 20/100 or 20/200. Receiving maintenance injections every seven weeks to prevent progression. - Continue maintenance injections every seven weeks.  9  caregiver stress  -  Recommended flu and COVID vaccinations, with option to separate by one to two weeks.           Labs/tests ordered:  * No order type specified * Next appt:  Visit date not found        [1]  Allergies Allergen Reactions   Shellfish Allergy Hives and Shortness Of Breath   Codeine Other (See Comments)    Headache   Imdur  [Isosorbide  Nitrate] Other (See Comments)    Confusion   Lanolin Itching   Latex Other (See Comments)    Unknown, allergy testing result   Pollen Extract    Statins     Simvastatin, Lipitor (both caused muscle aches); Crestor 5 mg and 10 mg daily (memory impairment), Livalo  (throat and mouth itching), pravastatin  10 mg qd (memory)   Tramadol     Patient said she had Mental break down    Cephalexin Itching and Rash   Ciprofloxacin Rash   Clindamycin /Lincomycin Rash   Erythromycin Itching and Rash   Penicillins Itching and Rash   Propylene Glycol Itching and Rash   Sulfa Antibiotics Itching and Rash   Tetracycline Itching and Rash   "

## 2024-10-13 ENCOUNTER — Other Ambulatory Visit: Payer: Self-pay | Admitting: Internal Medicine

## 2024-10-13 DIAGNOSIS — E039 Hypothyroidism, unspecified: Secondary | ICD-10-CM

## 2024-10-13 DIAGNOSIS — M818 Other osteoporosis without current pathological fracture: Secondary | ICD-10-CM

## 2024-10-13 DIAGNOSIS — I10 Essential (primary) hypertension: Secondary | ICD-10-CM

## 2024-10-22 ENCOUNTER — Encounter: Payer: Self-pay | Admitting: Internal Medicine

## 2024-10-28 ENCOUNTER — Other Ambulatory Visit: Payer: Self-pay | Admitting: Internal Medicine

## 2024-10-28 MED ORDER — NEOMYCIN-POLYMYXIN-HC 3.5-10000-1 OT SOLN: 3.0000 [drp] | Freq: Four times a day (QID) | OTIC | 0 refills | Status: AC

## 2024-11-05 ENCOUNTER — Telehealth: Payer: Self-pay

## 2024-11-05 DIAGNOSIS — E039 Hypothyroidism, unspecified: Secondary | ICD-10-CM

## 2024-11-05 MED ORDER — SYNTHROID 75 MCG PO TABS: 75.0000 ug | ORAL_TABLET | Freq: Every day | ORAL | 3 refills | Status: AC

## 2024-11-05 MED ORDER — SYNTHROID 75 MCG PO TABS: 75.0000 ug | ORAL_TABLET | Freq: Every day | ORAL | 3 refills | Status: DC

## 2024-11-05 NOTE — Telephone Encounter (Signed)
 Spoke with patient and to let her know that we send the medication to the emergency pharmacy because she run out and that she would like to send a request for a 90 day supply to [facility]

## 2024-11-05 NOTE — Telephone Encounter (Signed)
 Copied from CRM #[id number]. Topic: Clinical - Medication Refill >> Nov 04, 2024 11:10 AM Suzette B wrote: Medication: SYNTHROID  75 MCG tablet  Has the patient contacted their pharmacy? Yes:pt stated that she had been requested the refill for this meds to be sent to the mail order pharmacy; however [facility] also called to request this meds for the pt   This is the patient's preferred pharmacy:  [facility] [place], [place] - [address] [facility] AT Portal to [facility] [address] [place] [address] Phone: [phone] Fax: [phone]  Is this the correct pharmacy for this prescription? Yes If no, delete pharmacy and type the correct one.   Has the prescription been filled recently? Yes  Is the patient out of the medication? Yes  Has the patient been seen for an appointment in the last year OR does the patient have an upcoming appointment? Yes  Can we respond through MyChart? Yes  Agent: Please be advised that Rx refills may take up to 3 business days. We ask that you follow-up with your pharmacy. >> Nov 05, 2024 10:58 AM DeAngela L wrote: Patient states she had an appointment with Dr Charlanne recently and thought she asked for a medication refill on all of her medication  for a one year supply for her mail order pharmacy   [facility] [place], [place] - One [facility] AT Portal to Registered [facility] Sites  [address] [address]  Phone: [phone] Fax: [phone]   Patient num [phone]

## 2025-04-09 ENCOUNTER — Encounter: Admitting: Internal Medicine
